# Patient Record
Sex: Female | Born: 1960
Health system: Southern US, Community
[De-identification: ages and names within clinical notes are randomized; demographics above are authoritative.]

## PROBLEM LIST (undated history)

## (undated) DIAGNOSIS — G43909 Migraine, unspecified, not intractable, without status migrainosus: Secondary | ICD-10-CM

## (undated) DIAGNOSIS — F32A Depression, unspecified: Secondary | ICD-10-CM

## (undated) DIAGNOSIS — C539 Malignant neoplasm of cervix uteri, unspecified: Secondary | ICD-10-CM

## (undated) DIAGNOSIS — F419 Anxiety disorder, unspecified: Secondary | ICD-10-CM

## (undated) DIAGNOSIS — F329 Major depressive disorder, single episode, unspecified: Secondary | ICD-10-CM

## (undated) DIAGNOSIS — I10 Essential (primary) hypertension: Secondary | ICD-10-CM

## (undated) DIAGNOSIS — J42 Unspecified chronic bronchitis: Secondary | ICD-10-CM

## (undated) DIAGNOSIS — R002 Palpitations: Secondary | ICD-10-CM

## (undated) DIAGNOSIS — C569 Malignant neoplasm of unspecified ovary: Secondary | ICD-10-CM

## (undated) DIAGNOSIS — G4700 Insomnia, unspecified: Secondary | ICD-10-CM

## (undated) DIAGNOSIS — F909 Attention-deficit hyperactivity disorder, unspecified type: Secondary | ICD-10-CM

## (undated) DIAGNOSIS — E785 Hyperlipidemia, unspecified: Secondary | ICD-10-CM

## (undated) DIAGNOSIS — A6920 Lyme disease, unspecified: Secondary | ICD-10-CM

## (undated) HISTORY — DX: Hyperlipidemia, unspecified: E78.5

## (undated) HISTORY — DX: Attention-deficit hyperactivity disorder, unspecified type: F90.9

## (undated) HISTORY — PX: BREAST ENHANCEMENT SURGERY: SHX7

## (undated) HISTORY — DX: Depression, unspecified: F32.A

## (undated) HISTORY — DX: Insomnia, unspecified: G47.00

## (undated) HISTORY — DX: Palpitations: R00.2

## (undated) HISTORY — DX: Anxiety disorder, unspecified: F41.9

## (undated) HISTORY — DX: Malignant neoplasm of cervix uteri, unspecified: C53.9

## (undated) HISTORY — DX: Essential (primary) hypertension: I10

## (undated) HISTORY — DX: Lyme disease, unspecified: A69.20

## (undated) HISTORY — DX: Unspecified chronic bronchitis: J42

---
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
## 1992-12-17 HISTORY — PX: ABDOMINAL HYSTERECTOMY: SHX81

## 1996-12-17 HISTORY — PX: AUGMENTATION MAMMAPLASTY: SUR837

## 1998-12-17 HISTORY — PX: ANTERIOR CERVICAL DECOMP/DISCECTOMY FUSION: SHX1161

## 2001-11-04 ENCOUNTER — Encounter: Payer: Self-pay | Admitting: Family Medicine

## 2001-11-04 ENCOUNTER — Encounter: Admission: RE | Admit: 2001-11-04 | Discharge: 2001-11-04 | Payer: Self-pay | Admitting: Family Medicine

## 2001-11-25 ENCOUNTER — Encounter: Payer: Self-pay | Admitting: Neurosurgery

## 2001-11-25 ENCOUNTER — Ambulatory Visit (HOSPITAL_COMMUNITY): Admission: RE | Admit: 2001-11-25 | Discharge: 2001-11-26 | Payer: Self-pay | Admitting: Neurosurgery

## 2001-12-29 ENCOUNTER — Encounter: Payer: Self-pay | Admitting: Neurosurgery

## 2001-12-29 ENCOUNTER — Ambulatory Visit (HOSPITAL_COMMUNITY): Admission: RE | Admit: 2001-12-29 | Discharge: 2001-12-29 | Payer: Self-pay | Admitting: Neurosurgery

## 2002-01-16 ENCOUNTER — Ambulatory Visit (HOSPITAL_COMMUNITY): Admission: RE | Admit: 2002-01-16 | Discharge: 2002-01-16 | Payer: Self-pay | Admitting: Neurosurgery

## 2002-01-16 ENCOUNTER — Encounter: Payer: Self-pay | Admitting: Neurosurgery

## 2002-03-09 ENCOUNTER — Ambulatory Visit (HOSPITAL_COMMUNITY): Admission: RE | Admit: 2002-03-09 | Discharge: 2002-03-09 | Payer: Self-pay | Admitting: Neurosurgery

## 2002-03-09 ENCOUNTER — Encounter: Payer: Self-pay | Admitting: Neurosurgery

## 2003-03-19 ENCOUNTER — Encounter: Payer: Self-pay | Admitting: Family Medicine

## 2003-03-19 ENCOUNTER — Encounter: Admission: RE | Admit: 2003-03-19 | Discharge: 2003-03-19 | Payer: Self-pay | Admitting: Family Medicine

## 2003-06-10 ENCOUNTER — Ambulatory Visit (HOSPITAL_COMMUNITY): Admission: RE | Admit: 2003-06-10 | Discharge: 2003-06-10 | Payer: Self-pay | Admitting: Family Medicine

## 2005-01-10 ENCOUNTER — Encounter: Admission: RE | Admit: 2005-01-10 | Discharge: 2005-01-10 | Payer: Self-pay | Admitting: Family Medicine

## 2014-02-22 ENCOUNTER — Other Ambulatory Visit (HOSPITAL_COMMUNITY)
Admission: RE | Admit: 2014-02-22 | Discharge: 2014-02-22 | Disposition: A | Discharge status: Home / Self Care | Payer: BC Managed Care – PPO | Source: Ambulatory Visit | Attending: Internal Medicine | Admitting: Internal Medicine

## 2014-02-22 DIAGNOSIS — Z01419 Encounter for gynecological examination (general) (routine) without abnormal findings: Secondary | ICD-10-CM | POA: Insufficient documentation

## 2014-09-15 ENCOUNTER — Encounter (HOSPITAL_BASED_OUTPATIENT_CLINIC_OR_DEPARTMENT_OTHER): Payer: Self-pay | Admitting: Emergency Medicine

## 2014-09-15 ENCOUNTER — Emergency Department (HOSPITAL_BASED_OUTPATIENT_CLINIC_OR_DEPARTMENT_OTHER): Payer: BC Managed Care – PPO

## 2014-09-15 ENCOUNTER — Emergency Department (HOSPITAL_BASED_OUTPATIENT_CLINIC_OR_DEPARTMENT_OTHER)
Admission: EM | Admit: 2014-09-15 | Discharge: 2014-09-15 | Disposition: A | Discharge status: Home / Self Care | Payer: BC Managed Care – PPO | Attending: Emergency Medicine | Admitting: Emergency Medicine

## 2014-09-15 DIAGNOSIS — Z8543 Personal history of malignant neoplasm of ovary: Secondary | ICD-10-CM | POA: Diagnosis not present

## 2014-09-15 DIAGNOSIS — G43909 Migraine, unspecified, not intractable, without status migrainosus: Secondary | ICD-10-CM | POA: Insufficient documentation

## 2014-09-15 DIAGNOSIS — M531 Cervicobrachial syndrome: Secondary | ICD-10-CM | POA: Diagnosis not present

## 2014-09-15 DIAGNOSIS — R112 Nausea with vomiting, unspecified: Secondary | ICD-10-CM | POA: Diagnosis not present

## 2014-09-15 DIAGNOSIS — Z8679 Personal history of other diseases of the circulatory system: Secondary | ICD-10-CM | POA: Insufficient documentation

## 2014-09-15 DIAGNOSIS — R509 Fever, unspecified: Secondary | ICD-10-CM | POA: Insufficient documentation

## 2014-09-15 DIAGNOSIS — Z79899 Other long term (current) drug therapy: Secondary | ICD-10-CM | POA: Insufficient documentation

## 2014-09-15 DIAGNOSIS — M5481 Occipital neuralgia: Secondary | ICD-10-CM

## 2014-09-15 DIAGNOSIS — F17200 Nicotine dependence, unspecified, uncomplicated: Secondary | ICD-10-CM | POA: Insufficient documentation

## 2014-09-15 HISTORY — DX: Migraine, unspecified, not intractable, without status migrainosus: G43.909

## 2014-09-15 HISTORY — DX: Malignant neoplasm of unspecified ovary: C56.9

## 2014-09-15 LAB — BASIC METABOLIC PANEL
Anion gap: 15 (ref 5–15)
BUN: 9 mg/dL (ref 6–23)
CO2: 29 mEq/L (ref 19–32)
Calcium: 9.6 mg/dL (ref 8.4–10.5)
Chloride: 97 mEq/L (ref 96–112)
Creatinine, Ser: 0.7 mg/dL (ref 0.50–1.10)
GFR calc Af Amer: >90 mL/min (ref >90)
GFR calc non Af Amer: >90 mL/min (ref >90)
Glucose, Bld: 114 mg/dL — ABNORMAL HIGH (ref 70–99)
Potassium: 3.6 mEq/L — ABNORMAL LOW (ref 3.7–5.3)
Sodium: 141 mEq/L (ref 137–147)

## 2014-09-15 LAB — URINALYSIS, ROUTINE W REFLEX MICROSCOPIC
Bilirubin Urine: NEGATIVE
Glucose, UA: NEGATIVE mg/dL
Ketones, ur: NEGATIVE mg/dL
Leukocytes, UA: NEGATIVE
Nitrite: NEGATIVE
Protein, ur: 30 mg/dL — AB
Specific Gravity, Urine: 1.021 (ref 1.005–1.030)
Urobilinogen, UA: 4 mg/dL — ABNORMAL HIGH (ref 0.0–1.0)
pH: 7 (ref 5.0–8.0)

## 2014-09-15 LAB — CBC WITH DIFFERENTIAL/PLATELET
Basophils Absolute: 0 10*3/uL (ref 0.0–0.1)
Basophils Relative: 0 % (ref 0–1)
Eosinophils Absolute: 0 10*3/uL (ref 0.0–0.7)
Eosinophils Relative: 0 % (ref 0–5)
HCT: 39.4 % (ref 36.0–46.0)
Hemoglobin: 13.3 g/dL (ref 12.0–15.0)
Lymphocytes Relative: 12 % (ref 12–46)
Lymphs Abs: 0.9 10*3/uL (ref 0.7–4.0)
MCH: 32.8 pg (ref 26.0–34.0)
MCHC: 33.8 g/dL (ref 30.0–36.0)
MCV: 97.3 fL (ref 78.0–100.0)
Monocytes Absolute: 0.8 10*3/uL (ref 0.1–1.0)
Monocytes Relative: 10 % (ref 3–12)
Neutro Abs: 6.2 10*3/uL (ref 1.7–7.7)
Neutrophils Relative %: 78 % — ABNORMAL HIGH (ref 43–77)
Platelets: 166 10*3/uL (ref 150–400)
RBC: 4.05 MIL/uL (ref 3.87–5.11)
RDW: 12.6 % (ref 11.5–15.5)
WBC: 7.9 10*3/uL (ref 4.0–10.5)

## 2014-09-15 LAB — URINE MICROSCOPIC-ADD ON

## 2014-09-15 LAB — CSF CELL COUNT WITH DIFFERENTIAL
RBC Count, CSF: 0 /mm3
RBC Count, CSF: 0 /mm3
Tube #: 1
Tube #: 4
WBC, CSF: 0 /mm3 (ref 0–5)
WBC, CSF: 1 /mm3 (ref 0–5)

## 2014-09-15 LAB — GLUCOSE, CSF: Glucose, CSF: 79 mg/dL — ABNORMAL HIGH (ref 43–76)

## 2014-09-15 LAB — I-STAT CG4 LACTIC ACID, ED: Lactic Acid, Venous: 0.68 mmol/L (ref 0.5–2.2)

## 2014-09-15 LAB — PROTEIN, CSF: Total  Protein, CSF: 30 mg/dL (ref 15–45)

## 2014-09-15 MED ORDER — KETOROLAC TROMETHAMINE 30 MG/ML IJ SOLN
30.0000 mg | Freq: Once | INTRAMUSCULAR | Status: AC
  Administered 2014-09-15: 30 mg via INTRAVENOUS
  Filled 2014-09-15: qty 1

## 2014-09-15 MED ORDER — SODIUM CHLORIDE 0.9 % IV BOLUS (SEPSIS)
1000.0000 mL | Freq: Once | INTRAVENOUS | Status: AC
  Administered 2014-09-15: 1000 mL via INTRAVENOUS

## 2014-09-15 MED ORDER — MORPHINE SULFATE 4 MG/ML IJ SOLN
4.0000 mg | Freq: Once | INTRAMUSCULAR | Status: AC
  Administered 2014-09-15: 4 mg via INTRAVENOUS
  Filled 2014-09-15: qty 1

## 2014-09-15 MED ORDER — ACETAMINOPHEN 325 MG PO TABS
650.0000 mg | ORAL_TABLET | Freq: Once | ORAL | Status: AC
  Administered 2014-09-15: 650 mg via ORAL
  Filled 2014-09-15: qty 2

## 2014-09-15 MED ORDER — CARBAMAZEPINE ER 100 MG PO TB12: 100.0000 mg | ORAL_TABLET | Freq: Two times a day (BID) | ORAL | Status: DC

## 2014-09-15 MED ORDER — OXYCODONE-ACETAMINOPHEN 5-325 MG PO TABS: 1.0000 | ORAL_TABLET | ORAL | Status: DC | PRN

## 2014-09-15 MED ORDER — SODIUM CHLORIDE 0.9 % IV BOLUS (SEPSIS)
1000.0000 mL | Freq: Once | INTRAVENOUS | Status: AC
Start: 2014-09-15 — End: 2014-09-15
  Administered 2014-09-15: 1000 mL via INTRAVENOUS

## 2014-09-15 NOTE — ED Notes (Signed)
Pt positioned lying flat on left side for 45 min per order post LP. Pt tolerated procedure well.

## 2014-09-15 NOTE — ED Provider Notes (Signed)
Patient signed out to me to follow up on CSF studies. Patient was seen for headache and fever. Patient's blood work and CT scan were negative. A lumbar puncture was performed to further evaluate for headache and fever. Results are very reassuring. No concern for meningitis based on the results. Also, no concern for subarachnoid hemorrhage.  Fevers of unclear origin. His any specific symptoms to explain the fever. White count is normal. All lab work was normal. Urinalysis equivocal, urine culture was sent.  I reexamined the patient and she is experiencing sharp stabbing pains from the base of her skull across the left side of her scalp. These come on in waves. It lasts for seconds and then resolves. She describes it as an ice pick type of feeling. This is consistent with neuralgia, occipital in origin. Patient will be treated with Percocet, Tegretol, follow up with her primary care doctor and neurology. Return if symptoms worsen.  Orpah Greek, MD 09/15/14 [phone]

## 2014-09-15 NOTE — Discharge Instructions (Signed)
Occipital Neuralgia Occipital neuralgia is a type of headache that causes episodes of very bad pain in the back of your head. Pain from occipital neuralgia may spread (radiate) to other parts of your head. The pain is usually brief and often goes away after you rest and relax. These headaches may be caused by irritation of the nerves that leave your spinal cord high up in your neck, just below the base of your skull (occipital nerves). Your occipital nerves transmit sensations from the back of your head, the top of your head, and the areas behind your ears. CAUSES Occipital neuralgia can occur without any known cause (primary headache syndrome). In other cases, occipital neuralgia is caused by pressure on or irritation of one of the two occipital nerves. Causes of occipital nerve compression or irritation include:  Wear and tear of the vertebrae in the neck (osteoarthritis).  Neck injury.  Disease of the disks that separate the vertebrae.  Tumors.  Gout.  Infections.  Diabetes.  Swollen blood vessels that put pressure on the occipital nerves.  Muscle spasm in the neck. SIGNS AND SYMPTOMS Pain is the main symptom of occipital neuralgia. It usually starts in the back of the head but may also be felt in other areas supplied by the occipital nerves. Pain is usually on one side but may be on both sides. You may have:   Brief episodes of very bad pain that is burning, stabbing, shocking, or shooting.  Pain behind the eye.  Pain triggered by neck movement or hair brushing.  Scalp tenderness.  Aching in the back of the head between episodes of very bad pain. DIAGNOSIS  Your health care provider may diagnose occipital neuralgia based on your symptoms and a physical exam. During the exam, the health care provider may push on areas supplied by the occipital nerves to see if they are painful. Some tests may also be done to help in making the diagnosis. These may include:  Imaging studies of  the upper spinal cord, such as an MRI or CT scan. These may show compression or spinal cord abnormalities.  Nerve block. You will get an injection of numbing medicine (local anesthetic) near the occipital nerve to see if this relieves pain. TREATMENT  Treatment may begin with simple measures, such as:   Rest.  Massage.  Heat.  Over-the-counter pain relievers. If these measures do not work, you may need other treatments, including:  Medicines such as:  Prescription-strength anti-inflammatory medicines.  Muscle relaxants.  Antiseizure medicines.  Antidepressants.  Steroid injection. This involves injections of local anesthetic and strong anti-inflammatory drugs (steroids).  Pulsed radiofrequency. Wires are implanted to deliver electrical impulses that block pain signals from the occipital nerve.  Physical therapy.  Surgery to relieve nerve pressure. HOME CARE INSTRUCTIONS  Take all medicines as directed by your health care provider.  Avoid activities that cause pain.  Rest when you have an attack of pain.  Try gentle massage or a heating pad to relieve pain.  Work with a physical therapist to learn stretching exercises you can do at home.  Try a different pillow or sleeping position.  Practice good posture.  Try to stay active. Get regular exercise that does not cause pain. Ask your health care provider to suggest safe exercises for you.  Keep all follow-up visits as directed by your health care provider. This is important. SEEK MEDICAL CARE IF:  Your medicine is not working.  You have new or worsening symptoms. SEEK IMMEDIATE MEDICAL CARE   IF:  You have very bad head pain that is not going away.  You have a sudden change in vision, balance, or speech. MAKE SURE YOU:  Understand these instructions.  Will watch your condition.  Will get help right away if you are not doing well or get worse. Document Released: 11/27/2001 Document Revised: 04/19/2014  Document Reviewed: 11/25/2013 [employer] Patient Information 2015 [employer]. This information is not intended to replace advice given to you by your health care provider. Make sure you discuss any questions you have with your health care provider.  

## 2014-09-15 NOTE — ED Provider Notes (Signed)
CSN: [id number]     Arrival date & time 09/15/14  1243 History   First MD Initiated Contact with Patient 09/15/14 1258     Chief Complaint  Patient presents with  . Fever      Patient is a 53 y.o. female presenting with headaches. The history is provided by the patient.  Headache Pain location:  Occipital Quality:  Stabbing Onset quality:  Sudden Duration:  2 days Timing:  Intermittent Progression:  Worsening Chronicity:  New Relieved by:  Nothing Worsened by:  Nothing tried Associated symptoms: fever, nausea and vomiting   Associated symptoms: no abdominal pain, no cough and no ear pain    Pt reports she has had fever for 3 days Starting 2 days ago she starting having occipital headaches that are intermittent and "shooting" into the front of her scalp No trauma No focal weakness She has never had this before No cough/sore throat   She reports distant h/o neck surgery, but no other neurosurgical procedure  Past Medical History  Diagnosis Date  . Migraine   . Ovarian cancer    Past Surgical History  Procedure Laterality Date  . Abdominal hysterectomy     No family history on file. History  Substance Use Topics  . Smoking status: Current Every Day Smoker -- 0.20 packs/day    Types: Cigarettes  . Smokeless tobacco: Not on file  . Alcohol Use: Not on file   OB History   Grav Para Term Preterm Abortions TAB SAB Ect Mult Living                 Review of Systems  Constitutional: Positive for fever.  HENT: Negative for ear pain.   Respiratory: Negative for cough.   Gastrointestinal: Positive for nausea and vomiting. Negative for abdominal pain.  Genitourinary: Negative for dysuria.  Neurological: Positive for headaches. Negative for weakness.  All other systems reviewed and are negative.     Allergies  Erythromycin  Home Medications   Prior to Admission medications   Medication Sig Start Date End Date Taking? Authorizing Provider  atenolol (TENORMIN)  50 MG tablet Take 50 mg by mouth daily.   Yes Historical Provider, MD  FLUoxetine (PROZAC) 20 MG capsule Take 20 mg by mouth daily.   Yes Historical Provider, MD  zolpidem (AMBIEN) 10 MG tablet Take 10 mg by mouth at bedtime as needed for sleep.   Yes Historical Provider, MD   BP 100/64  Pulse 93  Temp(Src) 101.8 F (38.8 C) (Oral)  Resp 20  Ht 5\' 5"  (1.651 m)  Wt 161 lb (73.029 kg)  BMI 26.79 kg/m2  SpO2 100% Physical Exam CONSTITUTIONAL: Well developed/well nourished.  Pt with frequent pain episodes and will move frequently in bed HEAD: Normocephalic/atraumatic EYES: EOMI/PERRL ENMT: Mucous membranes moist NECK: supple no meningeal signs SPINE:entire spine nontender, no erythema noted CV: S1/S2 noted, no murmurs/rubs/gallops noted LUNGS: Lungs are clear to auscultation bilaterally, no apparent distress ABDOMEN: soft, nontender, no rebound or guarding GU:no cva tenderness NEURO: Pt is awake/alert, moves all extremitiesx4 EXTREMITIES: pulses normal, full ROM SKIN: warm, color normal PSYCH: no abnormalities of mood noted  ED Course  LUMBAR PUNCTURE Date/Time: 09/15/2014 2:37 PM Performed by: Sharyon Cable Authorized by: Sharyon Cable Consent: written consent obtained. Risks and benefits: risks, benefits and alternatives were discussed Consent given by: patient Patient identity confirmed: verbally with patient and provided demographic data Time out: Immediately prior to procedure a "time out" was called to verify the correct patient,  procedure, equipment, support staff and site/side marked as required. Indications: evaluation for infection Anesthesia: local infiltration Local anesthetic: lidocaine 1% without epinephrine Anesthetic total: 5 ml Patient sedated: no Preparation: Patient was prepped and draped in the usual sterile fashion. Lumbar space: L4-L5 interspace Patient's position: sitting Needle gauge: 20 Number of attempts: 1 Fluid appearance: clear Tubes  of fluid: 4 Total volume: 5 ml Post-procedure: adhesive bandage applied Patient tolerance: Patient tolerated the procedure well with no immediate complications.      1:22 PM Pt with fever/HA.  She has frequent episodes of pain that start in occiput and radiates to crown/frontal part of head No signs of trauma No cervical spine tenderness With evidence of fever/HA, possible meningitis and advised she will need lumbar puncture CT head pending 2:39 PM Ct head negative She tolerated LP well but did have drop in BP afterwards, IV fluids ordered Will continue to monitor 3:20 PM Pt with some post procedural hypotension, suspect possible vagal reaction as she has no new complaints (no abd pain, no weakness in her legs, no vomiting, no cough) Labs pending Lactate was ordered and IV fluids ordered Will await to add on antibiotics once gram stain and cell count are resulted D/w dr Betsey Holiday to f/u on labs   Labs Review Labs Reviewed  BASIC METABOLIC PANEL - Abnormal; Notable for the following:    Potassium 3.6 (*)    Glucose, Bld 114 (*)    All other components within normal limits  CBC WITH DIFFERENTIAL - Abnormal; Notable for the following:    Neutrophils Relative % 78 (*)    All other components within normal limits  URINALYSIS, ROUTINE W REFLEX MICROSCOPIC - Abnormal; Notable for the following:    Hgb urine dipstick LARGE (*)    Protein, ur 30 (*)    Urobilinogen, UA 4.0 (*)    All other components within normal limits  URINE MICROSCOPIC-ADD ON - Abnormal; Notable for the following:    Squamous Epithelial / LPF FEW (*)    Bacteria, UA FEW (*)    All other components within normal limits  GLUCOSE, CSF - Abnormal; Notable for the following:    Glucose, CSF 79 (*)    All other components within normal limits  CSF CULTURE  GRAM STAIN  CSF CULTURE  GRAM STAIN  CSF CULTURE  PROTEIN, CSF  CSF CELL COUNT WITH DIFFERENTIAL  CSF CELL COUNT WITH DIFFERENTIAL  GLUCOSE, CSF   PROTEIN, CSF  CSF CELL COUNT WITH DIFFERENTIAL  CSF CELL COUNT WITH DIFFERENTIAL  GLUCOSE, CSF  PROTEIN, CSF  CSF CELL COUNT WITH DIFFERENTIAL  CSF CELL COUNT WITH DIFFERENTIAL  I-STAT CG4 LACTIC ACID, ED    Imaging Review Ct Head Wo Contrast  09/15/2014   CLINICAL DATA:  Headache.  Fever.  History of ovarian cancer.  EXAM: CT HEAD WITHOUT CONTRAST  TECHNIQUE: Contiguous axial images were obtained from the base of the skull through the vertex without intravenous contrast.  COMPARISON:  None.  FINDINGS: Sinuses/Soft tissues: Clear paranasal sinuses and mastoid air cells.  Intracranial: No mass lesion, hemorrhage, hydrocephalus, acute infarct, intra-axial, or extra-axial fluid collection.  IMPRESSION: Normal head CT.   Electronically Signed   By: Abigail Miyamoto M.D.   On: 09/15/2014 13:28      MDM   Final diagnoses:  None    Nursing notes including past medical history and social history reviewed and considered in documentation Labs/vital reviewed and considered     Sharyon Cable, MD 09/15/14 1523

## 2014-09-15 NOTE — ED Notes (Signed)
C/o fever since Sunday, h/a started Monday. Fever as high as 102.7. States pain shoots thru head for about 3 sec. C/o n/v last pm.

## 2014-09-17 LAB — URINE CULTURE
Colony Count: NO GROWTH
Culture: NO GROWTH

## 2014-09-18 LAB — CSF CULTURE W GRAM STAIN: Culture: NO GROWTH

## 2014-09-18 LAB — CSF CULTURE: Gram Stain: NONE SEEN

## 2014-10-25 ENCOUNTER — Ambulatory Visit: Payer: BC Managed Care – PPO | Admitting: Neurology

## 2014-12-17 HISTORY — PX: EYE SURGERY: SHX253

## 2015-02-23 ENCOUNTER — Encounter: Payer: Self-pay | Admitting: Internal Medicine

## 2015-02-23 ENCOUNTER — Ambulatory Visit (INDEPENDENT_AMBULATORY_CARE_PROVIDER_SITE_OTHER): Payer: BLUE CROSS/BLUE SHIELD | Admitting: Internal Medicine

## 2015-02-23 VITALS — BP 123/82 | HR 73 | Temp 98.2°F | Ht 65.0 in | Wt 158.0 lb

## 2015-02-23 DIAGNOSIS — R5382 Chronic fatigue, unspecified: Secondary | ICD-10-CM | POA: Diagnosis not present

## 2015-02-23 DIAGNOSIS — R5381 Other malaise: Secondary | ICD-10-CM

## 2015-02-23 NOTE — Progress Notes (Signed)
   Subjective:    Patient ID: Sharon Huffman, female    DOB: Nov 19, 1961, 54 y.o.   MRN: [id number]  HPI She comes in here for evaluation of possible Lyme disease. She gives me a history of starting to feel sick on September 27 of 2015. She describes some fever and she did have a rash on the back of her leg. No tick bite was noted at the time. The rash looked like a target lesion however spread to her feet her neck and her chest area. Was erythematous. No symptoms of Bell's palsy, no meningitis or encephalitis symptoms, no joint swelling or effusion in her knees. She then continued to have fever up to 103 for several weeks. She had a sharp ache as well and went to the [facility] emergency room.  There she had a CSF that was negative for white cells or any meningitis or encephalitis.  13 and glucose were normal. She was then given Tegretol 4 simple neuralgia. After taking the Tegretol, this is when she got a rash as described that started on the back of her right leg. She saw a dermatologist who felt it was secondary to the Tegretol. She then describes symptoms of her arms not working well, fatigue and persistent headache. She said her fever lasted several weeks. After an IT consultant, she was concerned with Lyme disease. She then asked for Lyme disease test after taking doxycycline that she had gotten from her dermatologist. Lyme disease test was positive serology for IgG and IgM. She has no travel to a Lyme endemic area. She does have deer ticks around her home but lives in [place].  She has had persistent symptoms since.     During her initial workup in the emergency room, she was told it was essentially a fever of unknown origin which she expressed the fact that all causes can be identified and there has to be a reason.   Review of Systems  Constitutional: Positive for fatigue. Negative for fever and unexpected weight change.  HENT: Negative for sore throat.   Gastrointestinal: Negative for nausea  and diarrhea.  Musculoskeletal: Positive for myalgias and arthralgias. Negative for joint swelling.  Neurological: Positive for headaches. Negative for dizziness, light-headedness and numbness.       Objective:   Physical Exam  Constitutional: She appears well-developed and well-nourished. No distress.  Eyes: No scleral icterus.  Skin: No rash noted.  Psychiatric: She has a normal mood and affect.          Assessment & Plan:

## 2015-02-23 NOTE — Assessment & Plan Note (Addendum)
She did not give any history that is concerning for Lyme disease. That would be a typical target lesion that does not spread, that would be followed by symptoms such as meningitis or encephalitis, tells palsy or possibly joint effusion. She had none of those symptoms so I do not suspect that she had Lyme disease.  Also what argues against Lyme disease as she has not been out of the area and there is very little if any Lyme disease in [place] area. She is not been to [facility]. She also did not remember a tick bite. Her symptoms are not typical of Lyme disease.  She did have positive serology however in context of no clinical syndrome of Lyme disease this would be considered a false positive. The CDC recommendation and non-endemic areas is not to use serology for diagnosis of Lyme disease as the pretest probability makes the positive predictive value low. This was explained to the patient. I suspect this was a viral syndrome that is been causing post infectious symptoms this was explained to patient and all questions were answered. Thank you for the consultation.  45 minutes spent with the patient including 25 minutes spent on face to face counseling

## 2015-06-14 ENCOUNTER — Encounter: Payer: Self-pay | Admitting: Sports Medicine

## 2015-06-14 ENCOUNTER — Ambulatory Visit (INDEPENDENT_AMBULATORY_CARE_PROVIDER_SITE_OTHER): Payer: BLUE CROSS/BLUE SHIELD | Admitting: Sports Medicine

## 2015-06-14 VITALS — BP 152/92 | Ht 65.5 in | Wt 157.0 lb

## 2015-06-14 DIAGNOSIS — M5441 Lumbago with sciatica, right side: Secondary | ICD-10-CM

## 2015-06-14 DIAGNOSIS — M549 Dorsalgia, unspecified: Secondary | ICD-10-CM | POA: Insufficient documentation

## 2015-06-14 DIAGNOSIS — M542 Cervicalgia: Secondary | ICD-10-CM | POA: Diagnosis not present

## 2015-06-14 MED ORDER — METHYLPREDNISOLONE ACETATE 80 MG/ML IJ SUSP
80.0000 mg | Freq: Once | INTRAMUSCULAR | Status: AC
  Administered 2015-06-14: 80 mg via INTRAMUSCULAR

## 2015-06-14 MED ORDER — KETOROLAC TROMETHAMINE 60 MG/2ML IM SOLN
60.0000 mg | Freq: Once | INTRAMUSCULAR | Status: AC
  Administered 2015-06-14: 60 mg via INTRAMUSCULAR

## 2015-06-14 MED ORDER — TRAMADOL HCL 50 MG PO TABS: 50.0000 mg | ORAL_TABLET | Freq: Two times a day (BID) | ORAL | Status: DC

## 2015-06-14 MED ORDER — CYCLOBENZAPRINE HCL 10 MG PO TABS: 10.0000 mg | ORAL_TABLET | Freq: Every evening | ORAL | Status: DC | PRN

## 2015-06-14 NOTE — Assessment & Plan Note (Signed)
Acute low back pain without red flags 80 depo medrol and 60 of toradol given today Normal reflexes, strength, and function so will treat acute episode and follow up after neck imaging for relief

## 2015-06-14 NOTE — Assessment & Plan Note (Signed)
6 months of chronic R sided neck pain with arm paraesthesias in C6 distribution, notable she has a Hx of HNP s/p surgical treatment with resolution of those symptoms Confounder of Lyme exposure but it was treated and several months prior to onset leading [place] to believe it is not related With symptoms for 6 months and  Hx of surgery will pursue advanced imaging, pending discussion with radiology to make teh best decision with the hardware in her neck.

## 2015-06-14 NOTE — Progress Notes (Signed)
HPI  Patient presents today for back pain and chronic neck pain  Low back pain Has had intermittent mild low back pain with some sciatica on the right side for several months. Yesterday she was doing yoga stretches involving low back stretching and had an acute onset right-sided low back pain with intermittent right posterior leg paresthesia type pain. She has no problem walking, she denies bowel or bladder dysfunction, saddle anesthesia, and leg weakness. She's been taking Mobic for this pain which helps minimally.  Neck pain She has a six-month history of neck and shoulder pain with paresthesia type symptoms radiating down her arm on the extensor surface of her R forearm and first through third digits. She does not have any arm weakness or dysfunction. The pain is intermittent and helped by 2400 mg daily of ibuprofen and only slightly by Mobic. She's recently switched from ibuprofen to meloxicam because of the high dose that she was taking on a daily basis. She feels her strength is normal.  Notably she had a Lyme disease exposure last August. This was treated with doxycycline in October. Her symptoms of back and neck pain were concerning to be associated with this, however they started only about 6 months ago and her tick exposure was about 9 months ago.   PMH: Smoking status noted ROS: Per HPI  Objective: BP 152/92 mmHg  Ht 5' 5.5" (1.664 m)  Wt 157 lb (71.215 kg)  BMI 25.72 kg/m2 Gen: NAD, alert, cooperative with exam HEENT: NCAT Ext: No edema, warm Neuro: Alert and oriented  Reflexes: BL Arms with 1+ triceps, 2+ biceps and brachioradialis, BL LE with 2+ achilles and patellar  Sensation intact and symmetric in BL UE Strength 5/5 throughout MSK:  Full ROM at neck and low back No muscle atrophy of UE No tendernss to palpation of shoulder landmarks or lumbar spine/paraspinal muscles Negative modified straight leg  Assessment and plan:  Back pain Acute low back pain  without red flags 80 depo medrol and 60 of toradol given today Normal reflexes, strength, and function so will treat acute episode and follow up after neck imaging for relief   Neck pain 6 months of chronic R sided neck pain with arm paraesthesias in C6 distribution, notable she has a Hx of HNP s/p surgical treatment with resolution of those symptoms Confounder of Lyme exposure but it was treated and several months prior to onset leading [place] to believe it is not related With symptoms for 6 months and  Hx of surgery will pursue advanced imaging, pending discussion with radiology to make teh best decision with the hardware in her neck.      Meds ordered this encounter  Medications  . traMADol (ULTRAM) 50 MG tablet    Sig: Take 1 tablet (50 mg total) by mouth 2 (two) times daily.    Dispense:  60 tablet    Refill:  0  . cyclobenzaprine (FLEXERIL) 10 MG tablet    Sig: Take 1 tablet (10 mg total) by mouth at bedtime as needed for muscle spasms.    Dispense:  30 tablet    Refill:  0    Patient seen and evaluated with the above resident. Prior to ordering advanced imaging I would like to get a plain x-ray of her cervical spine. I will like to specifically evaluate the hardware from her single level fusion. I will call her after I reviewed that study at which point we may decide to go ahead with further diagnostic imaging.

## 2015-06-15 ENCOUNTER — Encounter: Payer: Self-pay | Admitting: Sports Medicine

## 2015-06-15 ENCOUNTER — Telehealth: Payer: Self-pay | Admitting: Sports Medicine

## 2015-06-15 ENCOUNTER — Ambulatory Visit
Admission: RE | Admit: 2015-06-15 | Discharge: 2015-06-15 | Disposition: A | Discharge status: Home / Self Care | Payer: BLUE CROSS/BLUE SHIELD | Source: Ambulatory Visit | Attending: Sports Medicine | Admitting: Sports Medicine

## 2015-06-15 DIAGNOSIS — M542 Cervicalgia: Secondary | ICD-10-CM

## 2015-06-15 NOTE — Telephone Encounter (Signed)
I spoke with the patient on the phone today after reviewing the x-rays of her cervical spine. It looks like the lower right screw has worked itself out of the fusion plate and lies in the anterior prevertebral soft tissue at C7-T1. Her cervical fusion was done in 2000 by Dr. Trenton Gammon. I recommended a referral back over to him for his input regarding this unusual finding. She is also continuing to complain of low back pain despite yesterday's injections of Depo-Medrol and Toradol. Ultram and Flexeril have only been minimally beneficial. I will try her on Voltaren 75 mg twice daily with food in addition to her other medications. If her low back pain persists then this is something that Dr. Trenton Gammon can also address.

## 2015-06-16 MED ORDER — DICLOFENAC SODIUM 75 MG PO TBEC: 75.0000 mg | DELAYED_RELEASE_TABLET | Freq: Two times a day (BID) | ORAL | Status: DC

## 2015-06-16 NOTE — Addendum Note (Signed)
Addended by: Laurey Arrow on: 06/16/2015 05:37 PM   Modules accepted: Orders

## 2015-06-17 ENCOUNTER — Other Ambulatory Visit: Payer: Self-pay | Admitting: *Deleted

## 2015-06-17 DIAGNOSIS — M542 Cervicalgia: Secondary | ICD-10-CM

## 2015-06-30 ENCOUNTER — Other Ambulatory Visit: Payer: Self-pay | Admitting: Sports Medicine

## 2015-07-01 ENCOUNTER — Other Ambulatory Visit: Payer: Self-pay | Admitting: *Deleted

## 2015-07-01 MED ORDER — CYCLOBENZAPRINE HCL 10 MG PO TABS: 10.0000 mg | ORAL_TABLET | Freq: Every evening | ORAL | Status: DC | PRN

## 2015-07-01 NOTE — Telephone Encounter (Signed)
Pt requested refill.  Dr. Micheline Chapman said ok to refill 60 tablets.

## 2015-07-21 ENCOUNTER — Other Ambulatory Visit: Payer: Self-pay | Admitting: Sports Medicine

## 2015-07-21 ENCOUNTER — Other Ambulatory Visit: Payer: Self-pay | Admitting: *Deleted

## 2015-07-25 MED ORDER — TRAMADOL HCL 50 MG PO TABS: 50.0000 mg | ORAL_TABLET | Freq: Two times a day (BID) | ORAL | Status: DC

## 2015-07-25 NOTE — Addendum Note (Signed)
Addended by: Laurey Arrow on: 07/25/2015 01:25 PM   Modules accepted: Orders

## 2015-08-29 ENCOUNTER — Other Ambulatory Visit: Payer: Self-pay | Admitting: *Deleted

## 2015-08-29 MED ORDER — CYCLOBENZAPRINE HCL 10 MG PO TABS: 10.0000 mg | ORAL_TABLET | Freq: Every day | ORAL | Status: DC

## 2015-08-30 ENCOUNTER — Telehealth: Payer: Self-pay | Admitting: *Deleted

## 2015-08-30 NOTE — Telephone Encounter (Signed)
Pharmacy called with refill request on Tramadol.  Gave 30 tablets and the patient will need to call us if she needs another refill, since we may need to see her back in the office.

## 2016-05-10 DIAGNOSIS — M791 Myalgia: Secondary | ICD-10-CM | POA: Diagnosis not present

## 2016-05-10 DIAGNOSIS — W57XXXA Bitten or stung by nonvenomous insect and other nonvenomous arthropods, initial encounter: Secondary | ICD-10-CM | POA: Diagnosis not present

## 2016-05-31 DIAGNOSIS — M2550 Pain in unspecified joint: Secondary | ICD-10-CM | POA: Diagnosis not present

## 2016-05-31 DIAGNOSIS — E7800 Pure hypercholesterolemia, unspecified: Secondary | ICD-10-CM | POA: Diagnosis not present

## 2016-07-09 DIAGNOSIS — I10 Essential (primary) hypertension: Secondary | ICD-10-CM | POA: Diagnosis not present

## 2016-07-09 DIAGNOSIS — M79672 Pain in left foot: Secondary | ICD-10-CM | POA: Diagnosis not present

## 2016-07-09 DIAGNOSIS — M19042 Primary osteoarthritis, left hand: Secondary | ICD-10-CM | POA: Diagnosis not present

## 2016-07-09 DIAGNOSIS — M79671 Pain in right foot: Secondary | ICD-10-CM | POA: Diagnosis not present

## 2016-07-09 DIAGNOSIS — E7800 Pure hypercholesterolemia, unspecified: Secondary | ICD-10-CM | POA: Diagnosis not present

## 2016-07-09 DIAGNOSIS — N390 Urinary tract infection, site not specified: Secondary | ICD-10-CM | POA: Diagnosis not present

## 2016-07-09 DIAGNOSIS — R5383 Other fatigue: Secondary | ICD-10-CM | POA: Diagnosis not present

## 2016-07-09 DIAGNOSIS — M19041 Primary osteoarthritis, right hand: Secondary | ICD-10-CM | POA: Diagnosis not present

## 2016-07-09 DIAGNOSIS — E559 Vitamin D deficiency, unspecified: Secondary | ICD-10-CM | POA: Diagnosis not present

## 2016-07-09 DIAGNOSIS — M791 Myalgia: Secondary | ICD-10-CM | POA: Diagnosis not present

## 2016-07-09 DIAGNOSIS — M064 Inflammatory polyarthropathy: Secondary | ICD-10-CM | POA: Diagnosis not present

## 2016-07-09 DIAGNOSIS — M2550 Pain in unspecified joint: Secondary | ICD-10-CM | POA: Diagnosis not present

## 2016-07-13 DIAGNOSIS — Z0000 Encounter for general adult medical examination without abnormal findings: Secondary | ICD-10-CM | POA: Diagnosis not present

## 2016-08-16 ENCOUNTER — Telehealth: Payer: Self-pay | Admitting: Acute Care

## 2016-08-16 NOTE — Telephone Encounter (Signed)
ATC pt. Unable to reach pt or LVM. Referral has been canceled due to several unsuccessful attempts to schedule SDMV. Form has been faxed to Dr. Pennie Banter office via fax. Per medical records form can not be scanned into chart.  Nothing further needed.

## 2016-08-29 DIAGNOSIS — H5213 Myopia, bilateral: Secondary | ICD-10-CM | POA: Diagnosis not present

## 2016-09-05 DIAGNOSIS — H5462 Unqualified visual loss, left eye, normal vision right eye: Secondary | ICD-10-CM | POA: Diagnosis not present

## 2016-09-25 ENCOUNTER — Ambulatory Visit (INDEPENDENT_AMBULATORY_CARE_PROVIDER_SITE_OTHER): Payer: BLUE CROSS/BLUE SHIELD | Admitting: Neurology

## 2016-09-25 ENCOUNTER — Encounter: Payer: Self-pay | Admitting: Neurology

## 2016-09-25 VITALS — BP 121/76 | HR 92 | Ht 65.5 in | Wt 180.2 lb

## 2016-09-25 DIAGNOSIS — H539 Unspecified visual disturbance: Secondary | ICD-10-CM | POA: Diagnosis not present

## 2016-09-25 DIAGNOSIS — G44221 Chronic tension-type headache, intractable: Secondary | ICD-10-CM

## 2016-09-25 NOTE — Progress Notes (Signed)
Reason for visit: Visual disturbance  Referring physician: Dr. Conni Huffman is a 55 y.o. female  History of present illness:  [name] is a 55 year old right-handed white female with a history of a posterior vitreous detachment requiring surgery in February 2016. The patient has noted a gradual decline in her visual acuity involving the left eye since the surgery, she was reevaluated in November 2016, and she had a new glasses prescription at that time that seemed to help her vision. The patient continues to have a gradual change in vision involving the left eye, and by the spring of 2017 there was a significant dichotomy between the visual acuity from one eye to the next. The patient has begun having daily headaches associated with eye strain. When she first wakes up in the morning, she has no headache, but when she tries to focus during the day, the headache gradually worsens around the left eye with a pressure sensation. The patient may feel queasy at times. She denies any focal numbness or weakness of the face, arms, or legs. She denies any problems controlling the bowels or the bladder. She reports a mild gait disturbance. She does have some neck discomfort, she has had prior cervical spine surgery. She has a history of Lyme disease, she indicates that she will run low-grade fevers off and on. She has been evaluated through rheumatology in the past. The patient was seen again for an ophthalmologic evaluation, she was given contact lenses which seemed to correct the vision in the left eye, but the patient reports that she has dry eyes, she could not tolerate the contact lens. She is sent to this office for an evaluation.  Past Medical History:  Diagnosis Date  . Lyme disease   . Migraine   . Ovarian cancer [facility])     Past Surgical History:  Procedure Laterality Date  . ABDOMINAL HYSTERECTOMY  1994  . ANTERIOR CERVICAL DECOMP/DISCECTOMY FUSION  2000  . EYE SURGERY  2016   PVD      Family History  Problem Relation Age of Onset  . Dementia Mother   . Hypertension Mother   . Hyperlipidemia Mother   . Heart attack Father     49    Social history:  reports that she has been smoking Cigarettes.  She started smoking about 40 years ago. She has been smoking about 0.50 packs per day. She has never used smokeless tobacco. She reports that she drinks alcohol. She reports that she does not use drugs.  Medications:  Prior to Admission medications   Medication Sig Start Date End Date Taking? Authorizing Provider  amphetamine-dextroamphetamine (ADDERALL) 20 MG tablet Take 20 mg by mouth daily.    Historical Provider, MD  atenolol (TENORMIN) 50 MG tablet Take 50 mg by mouth daily.    Historical Provider, MD  CRESTOR 10 MG tablet Take 5 mg by mouth daily. 06/23/15   Historical Provider, MD  cyclobenzaprine (FLEXERIL) 10 MG tablet Take 1 tablet (10 mg total) by mouth at bedtime. As needed for pain 08/29/15   Thurman Coyer, DO  diclofenac (VOLTAREN) 75 MG EC tablet Take 1 tablet (75 mg total) by mouth 2 (two) times daily. Take with food. 06/16/15   Thurman Coyer, DO  doxycycline (VIBRA-TABS) 100 MG tablet Take 100 mg by mouth 2 (two) times daily with a meal. 05/22/15   Historical Provider, MD  FLUoxetine (PROZAC) 20 MG capsule Take 20 mg by mouth daily.  Historical Provider, MD  meloxicam (MOBIC) 7.5 MG tablet TAKE 1 TABLET 1 OR 2 TIMES DAILY--DO NOT USE WITH IBUPROFEN 05/31/15   Historical Provider, MD  traMADol (ULTRAM) 50 MG tablet Take 1 tablet (50 mg total) by mouth 2 (two) times daily. 07/25/15   Carlos Levering Draper, DO  zolpidem (AMBIEN) 10 MG tablet Take 10 mg by mouth at bedtime as needed for sleep.    Historical Provider, MD      Allergies  Allergen Reactions  . Erythromycin Nausea And Vomiting    ROS:  Out of a complete 14 system review of symptoms, the patient complains only of the following symptoms, and all other reviewed systems are negative.  Weight  gain Ringing in the ears Blurred vision, eye pressure Easy bruising Feeling hot Joint pain, achy muscles  Blood pressure 121/76, pulse 92, height 5' 5.5" (1.664 m), weight 180 lb 4 oz (81.8 kg).  Physical Exam  General: The patient is alert and cooperative at the time of the examination.  Eyes: Pupils are equal, round, and reactive to light. Discs are flat bilaterally.  Neck: The neck is supple, no carotid bruits are noted.  Respiratory: The respiratory examination is clear.  Cardiovascular: The cardiovascular examination reveals a regular rate and rhythm, no obvious murmurs or rubs are noted.  Skin: Extremities are without significant edema.  Neurologic Exam  Mental status: The patient is alert and oriented x 3 at the time of the examination. The patient has apparent normal recent and remote memory, with an apparently normal attention span and concentration ability.  Cranial nerves: Facial symmetry is present. There is good sensation of the face to pinprick and soft touch bilaterally. The strength of the facial muscles and the muscles to head turning and shoulder shrug are normal bilaterally. Speech is well enunciated, no aphasia or dysarthria is noted. Extraocular movements are full. Visual fields are full. The tongue is midline, and the patient has symmetric elevation of the soft palate. No obvious hearing deficits are noted.  Motor: The motor testing reveals 5 over 5 strength of all 4 extremities. Good symmetric motor tone is noted throughout.  Sensory: Sensory testing is intact to pinprick, soft touch, vibration sensation, and position sense on all 4 extremities. No evidence of extinction is noted.  Coordination: Cerebellar testing reveals good finger-nose-finger and heel-to-shin bilaterally.  Gait and station: Gait is normal. Tandem gait is slightly unsteady. Romberg is negative. No drift is seen.  Reflexes: Deep tendon reflexes are symmetric and normal bilaterally. Toes  are downgoing bilaterally.   Assessment/Plan:  1. Blurred vision, OS  2. Chronic daily headache  The patient reports that she has significant problems with blurred vision of the left eye, the vision she claims can be corrected with using a contact lens. If this is true, this would suggest that the problem with blurred vision is one associated with refraction of light, not associated with a neurologic cause of visual changes. The patient seems to have daily headaches that she relates to eye strain. The patient will be setup for MRI evaluation of the brain. Blood work will be done today. If the studies are unremarkable, the patient may require further ophthalmologic evaluation to determine any other measures that may help correct the vision in the left eye.  VER study apparently was already done. ? Results appear to be normal.  Jill Alexanders MD 09/25/2016 8:32 AM  [facility] [address] [place], [place] [address]  Phone [phone] Fax [phone]

## 2016-09-26 LAB — SEDIMENTATION RATE: Sed Rate: 2 mm/hr (ref 0–40)

## 2016-09-26 LAB — C-REACTIVE PROTEIN: CRP: 4.8 mg/L (ref 0.0–4.9)

## 2016-09-26 LAB — VITAMIN B12: Vitamin B-12: 883 pg/mL (ref 211–946)

## 2016-10-03 ENCOUNTER — Telehealth: Payer: Self-pay | Admitting: Neurology

## 2016-10-03 NOTE — Telephone Encounter (Signed)
I am working on [employer] for her MRI Brain w/wo contrast and they need a peer to peer. The phone # [phone]. The information that they will need is the patient name her DOB 1961-09-23 and the insurance ID which is [id number] Please call in the next 2 business days. Thank you

## 2016-10-05 NOTE — Telephone Encounter (Signed)
I called for peer to peer review, the MRI of the brain has been approved, approval number is [id number] good through 11/01/2016.

## 2016-10-08 ENCOUNTER — Telehealth: Payer: Self-pay | Admitting: Neurology

## 2016-10-08 NOTE — Telephone Encounter (Signed)
Patient is returning a call to schedule an MRI.

## 2016-10-09 NOTE — Telephone Encounter (Signed)
Thank you Sharon Huffman scheduled her MRI for 10/17/16.

## 2016-10-10 NOTE — Telephone Encounter (Signed)
Patient has MRI scheduled at [facility] on 10/17/16

## 2016-10-17 ENCOUNTER — Ambulatory Visit (INDEPENDENT_AMBULATORY_CARE_PROVIDER_SITE_OTHER): Payer: BLUE CROSS/BLUE SHIELD

## 2016-10-17 DIAGNOSIS — G44221 Chronic tension-type headache, intractable: Secondary | ICD-10-CM

## 2016-10-17 DIAGNOSIS — H539 Unspecified visual disturbance: Secondary | ICD-10-CM | POA: Diagnosis not present

## 2016-10-18 MED ORDER — GADOPENTETATE DIMEGLUMINE 469.01 MG/ML IV SOLN: 18.0000 mL | Freq: Once | INTRAVENOUS | Status: DC | PRN

## 2016-10-19 ENCOUNTER — Telehealth: Payer: Self-pay | Admitting: Neurology

## 2016-10-19 NOTE — Telephone Encounter (Signed)
I called patient. The MRI does show some changes in the brainstem associated with blood pressure issues, the patient is on medication for blood pressure.  No abnormalities around the eye area.   MRI brain 10/18/16:  IMPRESSION:  This MRI of the brain with and without contrast shows the following: 1.   There are T2/FLAIR hyperintense foci in the pons bilaterally. However, no foci are noted in the cerebral hemispheres.   Although the isolated pontine foci could be related to chronic microvascular ischemic change, they could also represent changes from a metabolic process. Demyelination is unlikely. 2.   There is a normal enhancement pattern. There are no acute fin18

## 2016-10-19 NOTE — Telephone Encounter (Signed)
Patient called to request results of MRI, please call [phone].

## 2016-10-19 NOTE — Telephone Encounter (Signed)
I called patient. MRI the brain by my reading appears been unremarkable. The formal reading is not yet available. The study was done with and without gadolinium enhancement. The patient has visual acuity problems on one eye, the problem likely is intraocular.  I will call the patient back if the formal reading is different from what I have told her.

## 2016-11-06 DIAGNOSIS — F17200 Nicotine dependence, unspecified, uncomplicated: Secondary | ICD-10-CM | POA: Diagnosis not present

## 2016-11-06 DIAGNOSIS — F339 Major depressive disorder, recurrent, unspecified: Secondary | ICD-10-CM | POA: Diagnosis not present

## 2016-11-06 DIAGNOSIS — F900 Attention-deficit hyperactivity disorder, predominantly inattentive type: Secondary | ICD-10-CM | POA: Diagnosis not present

## 2016-11-06 DIAGNOSIS — M5441 Lumbago with sciatica, right side: Secondary | ICD-10-CM | POA: Diagnosis not present

## 2016-12-26 DIAGNOSIS — H5462 Unqualified visual loss, left eye, normal vision right eye: Secondary | ICD-10-CM | POA: Diagnosis not present

## 2017-01-22 DIAGNOSIS — H43821 Vitreomacular adhesion, right eye: Secondary | ICD-10-CM | POA: Diagnosis not present

## 2017-01-22 DIAGNOSIS — H2513 Age-related nuclear cataract, bilateral: Secondary | ICD-10-CM | POA: Diagnosis not present

## 2017-01-22 DIAGNOSIS — H43811 Vitreous degeneration, right eye: Secondary | ICD-10-CM | POA: Diagnosis not present

## 2017-02-06 DIAGNOSIS — F988 Other specified behavioral and emotional disorders with onset usually occurring in childhood and adolescence: Secondary | ICD-10-CM | POA: Diagnosis not present

## 2017-02-06 DIAGNOSIS — Z79899 Other long term (current) drug therapy: Secondary | ICD-10-CM | POA: Diagnosis not present

## 2017-02-06 DIAGNOSIS — F339 Major depressive disorder, recurrent, unspecified: Secondary | ICD-10-CM | POA: Diagnosis not present

## 2017-03-18 DIAGNOSIS — J45909 Unspecified asthma, uncomplicated: Secondary | ICD-10-CM | POA: Diagnosis not present

## 2017-03-18 DIAGNOSIS — J4521 Mild intermittent asthma with (acute) exacerbation: Secondary | ICD-10-CM | POA: Diagnosis not present

## 2017-04-02 DIAGNOSIS — H18411 Arcus senilis, right eye: Secondary | ICD-10-CM | POA: Diagnosis not present

## 2017-04-02 DIAGNOSIS — H2513 Age-related nuclear cataract, bilateral: Secondary | ICD-10-CM | POA: Diagnosis not present

## 2017-04-02 DIAGNOSIS — H25013 Cortical age-related cataract, bilateral: Secondary | ICD-10-CM | POA: Diagnosis not present

## 2017-04-02 DIAGNOSIS — H2512 Age-related nuclear cataract, left eye: Secondary | ICD-10-CM | POA: Diagnosis not present

## 2017-04-02 DIAGNOSIS — H25043 Posterior subcapsular polar age-related cataract, bilateral: Secondary | ICD-10-CM | POA: Diagnosis not present

## 2017-04-29 DIAGNOSIS — H2512 Age-related nuclear cataract, left eye: Secondary | ICD-10-CM | POA: Diagnosis not present

## 2017-04-29 DIAGNOSIS — H2513 Age-related nuclear cataract, bilateral: Secondary | ICD-10-CM | POA: Diagnosis not present

## 2017-04-30 DIAGNOSIS — H2511 Age-related nuclear cataract, right eye: Secondary | ICD-10-CM | POA: Diagnosis not present

## 2017-05-07 DIAGNOSIS — I10 Essential (primary) hypertension: Secondary | ICD-10-CM | POA: Diagnosis not present

## 2017-05-07 DIAGNOSIS — M5441 Lumbago with sciatica, right side: Secondary | ICD-10-CM | POA: Diagnosis not present

## 2017-05-07 DIAGNOSIS — F900 Attention-deficit hyperactivity disorder, predominantly inattentive type: Secondary | ICD-10-CM | POA: Diagnosis not present

## 2017-05-07 DIAGNOSIS — M5442 Lumbago with sciatica, left side: Secondary | ICD-10-CM | POA: Diagnosis not present

## 2017-05-13 DIAGNOSIS — H2511 Age-related nuclear cataract, right eye: Secondary | ICD-10-CM | POA: Diagnosis not present

## 2017-05-13 DIAGNOSIS — H2513 Age-related nuclear cataract, bilateral: Secondary | ICD-10-CM | POA: Diagnosis not present

## 2017-05-29 DIAGNOSIS — R300 Dysuria: Secondary | ICD-10-CM | POA: Diagnosis not present

## 2017-05-29 DIAGNOSIS — N390 Urinary tract infection, site not specified: Secondary | ICD-10-CM | POA: Diagnosis not present

## 2017-07-15 DIAGNOSIS — Z0000 Encounter for general adult medical examination without abnormal findings: Secondary | ICD-10-CM | POA: Diagnosis not present

## 2017-07-15 DIAGNOSIS — N390 Urinary tract infection, site not specified: Secondary | ICD-10-CM | POA: Diagnosis not present

## 2017-07-18 DIAGNOSIS — Z780 Asymptomatic menopausal state: Secondary | ICD-10-CM | POA: Diagnosis not present

## 2017-07-18 DIAGNOSIS — R002 Palpitations: Secondary | ICD-10-CM | POA: Diagnosis not present

## 2017-07-18 DIAGNOSIS — M545 Low back pain: Secondary | ICD-10-CM | POA: Diagnosis not present

## 2017-07-18 DIAGNOSIS — S299XXA Unspecified injury of thorax, initial encounter: Secondary | ICD-10-CM | POA: Diagnosis not present

## 2017-07-18 DIAGNOSIS — Z8249 Family history of ischemic heart disease and other diseases of the circulatory system: Secondary | ICD-10-CM | POA: Diagnosis not present

## 2017-07-18 DIAGNOSIS — S3992XA Unspecified injury of lower back, initial encounter: Secondary | ICD-10-CM | POA: Diagnosis not present

## 2017-07-18 DIAGNOSIS — Z8543 Personal history of malignant neoplasm of ovary: Secondary | ICD-10-CM | POA: Diagnosis not present

## 2017-07-18 DIAGNOSIS — M5442 Lumbago with sciatica, left side: Secondary | ICD-10-CM | POA: Diagnosis not present

## 2017-07-18 DIAGNOSIS — M546 Pain in thoracic spine: Secondary | ICD-10-CM | POA: Diagnosis not present

## 2017-07-18 DIAGNOSIS — Z0000 Encounter for general adult medical examination without abnormal findings: Secondary | ICD-10-CM | POA: Diagnosis not present

## 2017-08-09 DIAGNOSIS — Z6829 Body mass index (BMI) 29.0-29.9, adult: Secondary | ICD-10-CM | POA: Diagnosis not present

## 2017-08-09 DIAGNOSIS — M4310 Spondylolisthesis, site unspecified: Secondary | ICD-10-CM | POA: Diagnosis not present

## 2017-08-09 DIAGNOSIS — I10 Essential (primary) hypertension: Secondary | ICD-10-CM | POA: Diagnosis not present

## 2017-08-16 DIAGNOSIS — M4316 Spondylolisthesis, lumbar region: Secondary | ICD-10-CM | POA: Diagnosis not present

## 2017-08-16 DIAGNOSIS — R293 Abnormal posture: Secondary | ICD-10-CM | POA: Diagnosis not present

## 2017-08-16 DIAGNOSIS — M545 Low back pain: Secondary | ICD-10-CM | POA: Diagnosis not present

## 2017-08-16 DIAGNOSIS — M2560 Stiffness of unspecified joint, not elsewhere classified: Secondary | ICD-10-CM | POA: Diagnosis not present

## 2017-08-21 DIAGNOSIS — R293 Abnormal posture: Secondary | ICD-10-CM | POA: Diagnosis not present

## 2017-08-21 DIAGNOSIS — M4316 Spondylolisthesis, lumbar region: Secondary | ICD-10-CM | POA: Diagnosis not present

## 2017-08-21 DIAGNOSIS — M2560 Stiffness of unspecified joint, not elsewhere classified: Secondary | ICD-10-CM | POA: Diagnosis not present

## 2017-08-21 DIAGNOSIS — M545 Low back pain: Secondary | ICD-10-CM | POA: Diagnosis not present

## 2017-08-28 DIAGNOSIS — M545 Low back pain: Secondary | ICD-10-CM | POA: Diagnosis not present

## 2017-08-28 DIAGNOSIS — M4316 Spondylolisthesis, lumbar region: Secondary | ICD-10-CM | POA: Diagnosis not present

## 2017-08-28 DIAGNOSIS — R293 Abnormal posture: Secondary | ICD-10-CM | POA: Diagnosis not present

## 2017-08-28 DIAGNOSIS — M2560 Stiffness of unspecified joint, not elsewhere classified: Secondary | ICD-10-CM | POA: Diagnosis not present

## 2017-09-13 DIAGNOSIS — Z6829 Body mass index (BMI) 29.0-29.9, adult: Secondary | ICD-10-CM | POA: Diagnosis not present

## 2017-09-13 DIAGNOSIS — M48062 Spinal stenosis, lumbar region with neurogenic claudication: Secondary | ICD-10-CM | POA: Diagnosis not present

## 2017-09-13 DIAGNOSIS — I10 Essential (primary) hypertension: Secondary | ICD-10-CM | POA: Diagnosis not present

## 2017-09-13 DIAGNOSIS — M4310 Spondylolisthesis, site unspecified: Secondary | ICD-10-CM | POA: Diagnosis not present

## 2017-09-17 DIAGNOSIS — F411 Generalized anxiety disorder: Secondary | ICD-10-CM | POA: Diagnosis not present

## 2017-09-20 DIAGNOSIS — M48062 Spinal stenosis, lumbar region with neurogenic claudication: Secondary | ICD-10-CM | POA: Diagnosis not present

## 2017-10-16 DIAGNOSIS — F411 Generalized anxiety disorder: Secondary | ICD-10-CM | POA: Diagnosis not present

## 2017-10-16 DIAGNOSIS — I10 Essential (primary) hypertension: Secondary | ICD-10-CM | POA: Diagnosis not present

## 2018-02-03 DIAGNOSIS — F339 Major depressive disorder, recurrent, unspecified: Secondary | ICD-10-CM | POA: Diagnosis not present

## 2018-02-03 DIAGNOSIS — F900 Attention-deficit hyperactivity disorder, predominantly inattentive type: Secondary | ICD-10-CM | POA: Diagnosis not present

## 2018-04-15 DIAGNOSIS — F411 Generalized anxiety disorder: Secondary | ICD-10-CM | POA: Diagnosis not present

## 2018-04-15 DIAGNOSIS — F900 Attention-deficit hyperactivity disorder, predominantly inattentive type: Secondary | ICD-10-CM | POA: Diagnosis not present

## 2018-04-15 DIAGNOSIS — I10 Essential (primary) hypertension: Secondary | ICD-10-CM | POA: Diagnosis not present

## 2018-07-21 DIAGNOSIS — I10 Essential (primary) hypertension: Secondary | ICD-10-CM | POA: Diagnosis not present

## 2018-07-21 DIAGNOSIS — Z0000 Encounter for general adult medical examination without abnormal findings: Secondary | ICD-10-CM | POA: Diagnosis not present

## 2018-07-21 DIAGNOSIS — E559 Vitamin D deficiency, unspecified: Secondary | ICD-10-CM | POA: Diagnosis not present

## 2018-07-21 DIAGNOSIS — E7800 Pure hypercholesterolemia, unspecified: Secondary | ICD-10-CM | POA: Diagnosis not present

## 2018-07-23 DIAGNOSIS — Z0000 Encounter for general adult medical examination without abnormal findings: Secondary | ICD-10-CM | POA: Diagnosis not present

## 2018-08-04 DIAGNOSIS — Z6828 Body mass index (BMI) 28.0-28.9, adult: Secondary | ICD-10-CM | POA: Diagnosis not present

## 2018-08-04 DIAGNOSIS — Z01419 Encounter for gynecological examination (general) (routine) without abnormal findings: Secondary | ICD-10-CM | POA: Diagnosis not present

## 2018-09-30 DIAGNOSIS — E039 Hypothyroidism, unspecified: Secondary | ICD-10-CM | POA: Diagnosis not present

## 2018-10-09 ENCOUNTER — Institutional Professional Consult (permissible substitution): Payer: BLUE CROSS/BLUE SHIELD | Admitting: Neurology

## 2018-10-23 DIAGNOSIS — F900 Attention-deficit hyperactivity disorder, predominantly inattentive type: Secondary | ICD-10-CM | POA: Diagnosis not present

## 2018-10-23 DIAGNOSIS — Z79899 Other long term (current) drug therapy: Secondary | ICD-10-CM | POA: Diagnosis not present

## 2018-10-23 DIAGNOSIS — Z23 Encounter for immunization: Secondary | ICD-10-CM | POA: Diagnosis not present

## 2018-10-23 DIAGNOSIS — F411 Generalized anxiety disorder: Secondary | ICD-10-CM | POA: Diagnosis not present

## 2018-10-23 DIAGNOSIS — W57XXXA Bitten or stung by nonvenomous insect and other nonvenomous arthropods, initial encounter: Secondary | ICD-10-CM | POA: Diagnosis not present

## 2018-10-23 DIAGNOSIS — I10 Essential (primary) hypertension: Secondary | ICD-10-CM | POA: Diagnosis not present

## 2018-11-19 DIAGNOSIS — E039 Hypothyroidism, unspecified: Secondary | ICD-10-CM | POA: Diagnosis not present

## 2018-12-31 DIAGNOSIS — R6889 Other general symptoms and signs: Secondary | ICD-10-CM | POA: Diagnosis not present

## 2018-12-31 DIAGNOSIS — J019 Acute sinusitis, unspecified: Secondary | ICD-10-CM | POA: Diagnosis not present

## 2019-01-13 DIAGNOSIS — J45909 Unspecified asthma, uncomplicated: Secondary | ICD-10-CM | POA: Diagnosis not present

## 2019-01-13 DIAGNOSIS — J9801 Acute bronchospasm: Secondary | ICD-10-CM | POA: Diagnosis not present

## 2019-02-04 DIAGNOSIS — E039 Hypothyroidism, unspecified: Secondary | ICD-10-CM | POA: Diagnosis not present

## 2019-02-04 DIAGNOSIS — R05 Cough: Secondary | ICD-10-CM | POA: Diagnosis not present

## 2019-02-04 DIAGNOSIS — R5383 Other fatigue: Secondary | ICD-10-CM | POA: Diagnosis not present

## 2019-02-18 ENCOUNTER — Institutional Professional Consult (permissible substitution): Payer: BLUE CROSS/BLUE SHIELD | Admitting: Neurology

## 2019-03-02 DIAGNOSIS — J4521 Mild intermittent asthma with (acute) exacerbation: Secondary | ICD-10-CM | POA: Diagnosis not present

## 2019-03-02 DIAGNOSIS — I10 Essential (primary) hypertension: Secondary | ICD-10-CM | POA: Diagnosis not present

## 2019-03-17 ENCOUNTER — Telehealth: Payer: Self-pay | Admitting: Internal Medicine

## 2019-03-17 NOTE — Telephone Encounter (Signed)
Called and spoke with patient regarding MW is okay with keeping in house ov 03/20/19 Pt expressed and verbalized understanding Nothing further needed

## 2019-03-17 NOTE — Telephone Encounter (Signed)
Called and spoke with patient.   1. Patient has not been around anyone who has been confirmed or suspected to have COVID19 2. Patient has no new onset of symptoms that range from fever, shortness of breath, cough, and gasping for air.  3. Patient has not traveled internationally in the last 30 days.  4. Patient has not traveled outside out of [place] in the last 30 days.   Patient is being seen for a consult for Shortness of breath. Started in December thought had the flu but tests were negative. Patient stated that this is on going. This what her appointment is for. Patient gasps for air and it causes her to cough.   She is screened and cleared to come for her consult. Will route to MW  As Juluis Rainier.

## 2019-03-17 NOTE — Telephone Encounter (Signed)
Sounds fine for in house consult, thx

## 2019-03-20 ENCOUNTER — Ambulatory Visit (INDEPENDENT_AMBULATORY_CARE_PROVIDER_SITE_OTHER): Payer: BLUE CROSS/BLUE SHIELD

## 2019-03-20 ENCOUNTER — Institutional Professional Consult (permissible substitution): Payer: BLUE CROSS/BLUE SHIELD | Admitting: Emergency Medicine

## 2019-03-20 ENCOUNTER — Other Ambulatory Visit: Payer: Self-pay

## 2019-03-20 ENCOUNTER — Ambulatory Visit (INDEPENDENT_AMBULATORY_CARE_PROVIDER_SITE_OTHER): Payer: BLUE CROSS/BLUE SHIELD | Admitting: Internal Medicine

## 2019-03-20 ENCOUNTER — Encounter: Payer: Self-pay | Admitting: Internal Medicine

## 2019-03-20 VITALS — BP 122/72 | HR 84 | Ht 65.0 in | Wt 173.8 lb

## 2019-03-20 DIAGNOSIS — J45991 Cough variant asthma: Secondary | ICD-10-CM

## 2019-03-20 DIAGNOSIS — R0609 Other forms of dyspnea: Secondary | ICD-10-CM | POA: Diagnosis not present

## 2019-03-20 DIAGNOSIS — R0600 Dyspnea, unspecified: Secondary | ICD-10-CM

## 2019-03-20 DIAGNOSIS — R05 Cough: Secondary | ICD-10-CM | POA: Diagnosis not present

## 2019-03-20 MED ORDER — FAMOTIDINE 20 MG PO TABS: ORAL_TABLET | ORAL | 11 refills | Status: AC

## 2019-03-20 MED ORDER — MOMETASONE FURO-FORMOTEROL FUM 100-5 MCG/ACT IN AERO: 2.0000 | INHALATION_SPRAY | Freq: Two times a day (BID) | RESPIRATORY_TRACT | 0 refills | Status: DC

## 2019-03-20 MED ORDER — PANTOPRAZOLE SODIUM 40 MG PO TBEC: 40.0000 mg | DELAYED_RELEASE_TABLET | Freq: Every day | ORAL | 2 refills | Status: DC

## 2019-03-20 NOTE — Patient Instructions (Addendum)
Pantoprazole (protonix) 40 mg   Take  30-60 min before first meal of the day and Pepcid (famotidine)  20 mg one hour after supper  until return to office - this is the best way to tell whether stomach acid is contributing to your problem.    GERD (REFLUX)  is an extremely common cause of respiratory symptoms just like yours , many times with no obvious heartburn at all.    It can be treated with medication, but also with lifestyle changes including elevation of the head of your bed (ideally with 6 -8inch blocks under the headboard of your bed),  Smoking cessation, avoidance of late meals, excessive alcohol, and avoid fatty foods, chocolate, peppermint, colas, red wine, and acidic juices such as orange juice.  NO MINT OR MENTHOL PRODUCTS SO NO COUGH DROPS -  NO ACT  USE SUGARLESS CANDY INSTEAD (Jolley ranchers or Stover's or Life Savers) or even ice chips will also do - the key is to swallow to prevent all throat clearing. NO OIL BASED VITAMINS - use powdered substitutes.  Avoid fish oil when coughing.  If not improving Dulera 100  Up to 1-2 pffs every 12 hours as needed for cough/ wheeze/ short of breath but in theory should not need it.  Please remember to go to the  x-ray department  for your tests - we will call you with the results when they are available    Schedule a televist in 2 weeks to check on your  Progress - call us in meantime if needed

## 2019-03-20 NOTE — Progress Notes (Signed)
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 quit light smoking 11/27/18 with allergies as far back as she can remember with rhinitis spring treated with otc's and never needed any allergy testing with cough onset her late teens while in [place]  with dx of Lyme dz 2015 = fever, ha, rash, arthritis  > rx doxy helped but still having intermittent arthritis/ fatigue > [facility] eval not helpful > rheumatology eval neg > fatigue/arthritis and limited with activity and quit smoking "to help her husband quit" then around late Jan 2020 with doe, worse fatigue, mild chest fatigue, and chronic is worse and only transiently better with depomedrol /pred / albuterol worked better  than anything else so referred to pulmonary clinic 03/20/2019 by Sharon Huffman office.   History of Present Illness  03/20/2019  Pulmonary/ 1st office eval/Sharon Huffman  Chief Complaint  Patient presents with   pulm consult    Pt has increase SOB, chest tightness, dry cough for last 2 months.   Dyspnea:  Doe 45 - 100 ft nl pace / fight of steps difficult / 5 min housework Cough: w/in 15 min of stirring of cough dry / slt pink mucus x one week// sense of pnds but no nasal discharge  Assoc band like chest tightness and midline cp with coughing fits  Sleep: flat in bed one pillow  SABA use: smidge better / last alb x 24 h prior to OV   No h/o rx for ovarian ca (limited dx to capsule) Mouth dry / ACT lozenges  Cough worse around juniper trees / if holds cats closely   No obvious other patterns in  day to day or daytime variability or assoc excess/ purulent sputum or mucus plugs   subjective wheeze or overt sinus or hb symptoms.   Sleeps as above  without nocturnal  or early am exacerbation  of respiratory  c/o's or need for noct saba. Also denies any obvious fluctuation of symptoms with weather or environmental changes or other aggravating or alleviating factors except as outlined  above   No unusual exposure hx or h/o childhood pna/ asthma or knowledge of premature birth.  Current Allergies, Complete Past Medical History, Past Surgical History, Family History, and Social History were reviewed in [employer] record.  ROS  The following are not active complaints unless bolded Hoarseness, sore throat, dysphagia, dental problems, itching, sneezing,  nasal congestion or discharge of excess mucus or purulent secretions, ear ache,   fever, chills, sweats, unintended wt loss or wt gain, classically pleuritic or exertional cp,  orthopnea pnd or arm/hand swelling  or leg swelling, presyncope, palpitations, abdominal pain, anorexia, nausea, vomiting, diarrhea  or change in bowel habits or change in bladder habits, change in stools or change in urine, dysuria, hematuria,  rash, arthralgias, visual complaints, headache, numbness, weakness or ataxia or problems with walking or coordination,  change in mood or  memory.           Past Medical History:  Diagnosis Date   Bronchitis, chronic (HCC)    Lyme disease    Migraine    Ovarian cancer ([place])     Outpatient Medications Prior to Visit  Medication Sig Dispense Refill   ADDERALL XR 30 MG 24 hr capsule USE AS DIRECTED EVERY DAY  0   atenolol (TENORMIN) 50 MG tablet Take 50 mg by mouth daily.  CRESTOR 10 MG tablet Take 5 mg by mouth daily.  3   cyclobenzaprine (FLEXERIL) 10 MG tablet Take 1 tablet (10 mg total) by mouth at bedtime. As needed for pain 60 tablet 0   FLUoxetine (PROZAC) 20 MG capsule Take 20 mg by mouth daily.     traMADol (ULTRAM) 50 MG tablet Take 1 tablet (50 mg total) by mouth 2 (two) times daily. 30 tablet 0   zolpidem (AMBIEN) 10 MG tablet Take 10 mg by mouth at bedtime as needed for sleep.        Objective:     BP 122/72 (BP Location: Left Arm, Cuff Size: Normal)    Pulse 84    Ht 5\' 5"  (1.651 m)    Wt 173 lb 12.8 oz (78.8 kg)    SpO2 98%    BMI 28.92 kg/m   SpO2: 98  %   RA  Pleasant amb coughs at the end of exp  HEENT: nl dentition, turbinates bilaterally, and oropharynx. Nl external ear canals without cough reflex   NECK :  without JVD/Nodes/TM/ nl carotid upstrokes bilaterally   LUNGS: no acc muscle use,  Nl contour chest which is clear to A and P bilaterally    CV:  RRR  no s3 or murmur or increase in P2, and no edema   ABD:  soft and nontender with nl inspiratory excursion in the supine position. No bruits or organomegaly appreciated, bowel sounds nl  MS:  Nl gait/ ext warm without deformities, calf tenderness, cyanosis or clubbing No obvious joint restrictions   SKIN: warm and dry without lesions    NEURO:  alert, approp, nl sensorium with  no motor or cerebellar deficits apparent.                   Assessment   Cough variant asthma vs uacs Quit smoking 11/2018  - 03/20/2019  After extensive coaching inhaler device,  effectiveness =    50% from a basline of 25% > try dulera 100 up to 2 pffs every 12 h prn  - max rx for gerd 03/20/2019 >>>   Onset of symptoms after stopped all smoking and home smoke exposure is an unusual hx and favors uacs over asthma related cough but could have elements of both - if she does have asthma it is quite mild    1) UACS =  Upper airway cough syndrome (previously labeled PNDS),  is so named because it's frequently impossible to sort out how much is  CR/sinusitis with freq throat clearing (which can be related to primary GERD)   vs  causing  secondary (" extra esophageal")  GERD from wide swings in gastric pressure that occur with throat clearing, often  promoting self use of mint and menthol lozenges that reduce the lower esophageal sphincter tone and exacerbate the problem further in a cyclical fashion.   These are the same pts (now being labeled as having "irritable larynx syndrome" by some cough centers) who not infrequently have a history of having failed to tolerate ace inhibitors,  dry powder  inhalers or biphosphonates or report having atypical/extraesophageal reflux symptoms that don't respond to standard doses of PPI  and are easily confused as having aecopd or asthma flares by even experienced allergists/ pulmonologists (myself included).  - Of the three most common causes of  Sub-acute / recurrent or chronic cough, only one (GERD)  can actually contribute to/ trigger  the other two (asthma and post nasal drip syndrome)  and perpetuate the cylce of cough.  While not intuitively obvious, many patients with chronic low grade reflux do not cough until there is a primary insult that disturbs the protective epithelial barrier and exposes sensitive nerve endings.   This is typically viral but can due to PNDS and  either may apply here.     >>> The point is that once this occurs, it is difficult to eliminate the cycle  using anything but a maximally effective acid suppression regimen at least in the short run, accompanied by an appropriate diet to address non acid GERD and control / eliminate the cough itself starting with otc's like delsym and adding tramadol if needed as already has used for arthritis in past.   2) Mild cough variant asthma: Based on two studies from NEJM  378; 20 p 1865 (2018) and 380 : p2020-30 (2019) in pts with mild asthma it is reasonable to use low dose symbicort eg 80 2bid "prn" flare (and by inference dulera 100)  in this setting  and takes advantage of the rapid onset of action but is not the same as "rescue therapy"  - it  can be stopped once the acute symptoms have resolved and the need for rescue has been minimized (< 2 x weekly)     DOE (dyspnea on exertion) 03/20/2019   Walked RA  3 laps @  approx 210ft each @ avg pace  stopped due to  Sob/ dizzy but 02 sats never  pulse never over 100 <  96% on Tenormin 50 mg daily   Symptoms are markedly disproportionate to objective findings and not clear to what extent this is actually a pulmonary  problem but pt does appear to  have difficult to sort out respiratory symptoms of unknown origin for which  DDX  = almost all start with A and  include Adherence, Ace Inhibitors, Acid Reflux, Active Sinus Disease, Alpha 1 Antitripsin deficiency, Anxiety masquerading as Airways dz,  ABPA,  Allergy(esp in young), Aspiration (esp in elderly), Adverse effects of meds,  Active smoking or Vaping, A bunch of PE's/clot burden (a few small clots can't cause this syndrome unless there is already severe underlying pulm or vascular dz with poor reserve),  Anemia or thyroid disorder, plus two Bs  = Bronchiectasis and Beta blocker use..and one C= CHF     Adherence is always the initial "prime suspect" and is a multilayered concern that requires a "trust but verify" approach in every patient - starting with knowing how to use medications, especially inhalers, correctly, keeping up with refills and understanding the fundamental difference between maintenance and prns vs those medications only taken for a very short course and then stopped and not refilled.  - see hfa teaching - return with all meds in hand using a trust but verify approach to confirm accurate Medication  Reconciliation The principal here is that until we are certain that the  patients are doing what we've asked, it makes no sense to ask them to do more.    ? Acid (or non-acid) GERD > always difficult to exclude as up to 75% of pts in some series report no assoc GI/ Heartburn symptoms and this may explain her cough and midline cp  > rec max (24h)  acid suppression and diet restrictions/ reviewed and instructions given in writing.    ? Allergy/ asthma >  Works at [employer] with clear cut symptoms with close exposure, consider formal allergy testing or referral if not controlled on  ICS (see sep a/p)  ? Adverse drug effects > none of the usual suspects listed.  ? Beta blocker effects > may explain some of her symptoms and may need to be adjusted down or   In the setting of  respiratory symptoms of unknown etiology, consider trial of  bystolic, the most beta -1  selective Beta blocker available in sample form, with bisoprolol the most selective generic choice  on the market, at least on a trial basis, to make sure the spillover Beta 2 effects of the less specific Beta blockers are not contributing to this patient's symptoms.     >>>  Will do televist in 2 weeks, asked her to call back sooner if not doing better        Total time devoted to counseling  > 50 % of initial 60 min office visit:  review case with pt/personally observe portions of 02 amb sat study and  device teaching which extended face to face time for this visit  discussion of options/alternatives/ personally creating written customized instructions  in presence of pt  then going over those specific  Instructions directly with the pt including how to use all of the meds but in particular covering each new medication in detail and the difference between the maintenance= "automatic" meds and the prns using an action plan format for the latter (If this problem/symptom => do that organization reading Left to right).  Please see AVS from this visit for a full list of these instructions which I personally wrote for this pt and  are unique to this visit.        Christinia Gully, MD 03/20/2019

## 2019-03-22 ENCOUNTER — Encounter: Payer: Self-pay | Admitting: Internal Medicine

## 2019-03-22 DIAGNOSIS — R0609 Other forms of dyspnea: Secondary | ICD-10-CM | POA: Insufficient documentation

## 2019-03-22 DIAGNOSIS — R0600 Dyspnea, unspecified: Secondary | ICD-10-CM | POA: Insufficient documentation

## 2019-03-22 NOTE — Assessment & Plan Note (Signed)
Quit smoking 11/2018  - 03/20/2019  After extensive coaching inhaler device,  effectiveness =    50% from a basline of 25% > try dulera 100 up to 2 pffs every 12 h prn  - max rx for gerd 03/20/2019 >>>   Onset of symptoms after stopped all smoking and home smoke exposure is an unusual hx and favors uacs over asthma related cough but could have elements of both - if she does have asthma it is quite mild    1) UACS =  Upper airway cough syndrome (previously labeled PNDS),  is so named because it's frequently impossible to sort out how much is  CR/sinusitis with freq throat clearing (which can be related to primary GERD)   vs  causing  secondary (" extra esophageal")  GERD from wide swings in gastric pressure that occur with throat clearing, often  promoting self use of mint and menthol lozenges that reduce the lower esophageal sphincter tone and exacerbate the problem further in a cyclical fashion.   These are the same pts (now being labeled as having "irritable larynx syndrome" by some cough centers) who not infrequently have a history of having failed to tolerate ace inhibitors,  dry powder inhalers or biphosphonates or report having atypical/extraesophageal reflux symptoms that don't respond to standard doses of PPI  and are easily confused as having aecopd or asthma flares by even experienced allergists/ pulmonologists (myself included).  - Of the three most common causes of  Sub-acute / recurrent or chronic cough, only one (GERD)  can actually contribute to/ trigger  the other two (asthma and post nasal drip syndrome)  and perpetuate the cylce of cough.  While not intuitively obvious, many patients with chronic low grade reflux do not cough until there is a primary insult that disturbs the protective epithelial barrier and exposes sensitive nerve endings.   This is typically viral but can due to PNDS and  either may apply here.     >>> The point is that once this occurs, it is difficult to eliminate the  cycle  using anything but a maximally effective acid suppression regimen at least in the short run, accompanied by an appropriate diet to address non acid GERD and control / eliminate the cough itself starting with otc's like delsym and adding tramadol if needed as already has used for arthritis in past.   2) Mild cough variant asthma: Based on two studies from NEJM  378; 20 p 1865 (2018) and 380 : p2020-30 (2019) in pts with mild asthma it is reasonable to use low dose symbicort eg 80 2bid "prn" flare (and by inference dulera 100)  in this setting  and takes advantage of the rapid onset of action but is not the same as "rescue therapy"  - it  can be stopped once the acute symptoms have resolved and the need for rescue has been minimized (< 2 x weekly)

## 2019-03-22 NOTE — Assessment & Plan Note (Signed)
03/20/2019   Walked RA  3 laps @  approx 244ft each @ avg pace  stopped due to  Sob/ dizzy but 02 sats never  pulse never over 100 <  96% on Tenormin 50 mg daily   Symptoms are markedly disproportionate to objective findings and not clear to what extent this is actually a pulmonary  problem but pt does appear to have difficult to sort out respiratory symptoms of unknown origin for which  DDX  = almost all start with A and  include Adherence, Ace Inhibitors, Acid Reflux, Active Sinus Disease, Alpha 1 Antitripsin deficiency, Anxiety masquerading as Airways dz,  ABPA,  Allergy(esp in young), Aspiration (esp in elderly), Adverse effects of meds,  Active smoking or Vaping, A bunch of PE's/clot burden (a few small clots can't cause this syndrome unless there is already severe underlying pulm or vascular dz with poor reserve),  Anemia or thyroid disorder, plus two Bs  = Bronchiectasis and Beta blocker use..and one C= CHF     Adherence is always the initial "prime suspect" and is a multilayered concern that requires a "trust but verify" approach in every patient - starting with knowing how to use medications, especially inhalers, correctly, keeping up with refills and understanding the fundamental difference between maintenance and prns vs those medications only taken for a very short course and then stopped and not refilled.  - see hfa teaching - return with all meds in hand using a trust but verify approach to confirm accurate Medication  Reconciliation The principal here is that until we are certain that the  patients are doing what we've asked, it makes no sense to ask them to do more.    ? Acid (or non-acid) GERD > always difficult to exclude as up to 75% of pts in some series report no assoc GI/ Heartburn symptoms and this may explain her cough and midline cp  > rec max (24h)  acid suppression and diet restrictions/ reviewed and instructions given in writing.    ? Allergy/ asthma >  Works at [employer] with  clear cut symptoms with close exposure, consider formal allergy testing or referral if not controlled on ICS (see sep a/p)  ? Adverse drug effects > none of the usual suspect listed.  ? Beta blocker effects > may explain some of her symptoms and may need to be adjusted down or   In the setting of respiratory symptoms of unknown etiology, consider trial of  bystolic, the most beta -1  selective Beta blocker available in sample form, with bisoprolol the most selective generic choice  on the market, at least on a trial basis, to make sure the spillover Beta 2 effects of the less specific Beta blockers are not contributing to this patient's symptoms.    Total time devoted to counseling  > 50 % of initial 60 min office visit:  review case with pt/personally observe portions of 02 amb sat study and  device teaching which extended face to face time for this visit  discussion of options/alternatives/ personally creating written customized instructions  in presence of pt  then going over those specific  Instructions directly with the pt including how to use all of the meds but in particular covering each new medication in detail and the difference between the maintenance= "automatic" meds and the prns using an action plan format for the latter (If this problem/symptom => do that organization reading Left to right).  Please see AVS from this visit for a  full list of these instructions which I personally wrote for this pt and  are unique to this visit.

## 2019-03-23 NOTE — Progress Notes (Signed)
Spoke with pt and notified of results per Dr. Wert. Pt verbalized understanding and denied any questions. 

## 2019-03-25 DIAGNOSIS — J029 Acute pharyngitis, unspecified: Secondary | ICD-10-CM | POA: Diagnosis not present

## 2019-03-25 DIAGNOSIS — B370 Candidal stomatitis: Secondary | ICD-10-CM | POA: Diagnosis not present

## 2019-03-25 DIAGNOSIS — Z87891 Personal history of nicotine dependence: Secondary | ICD-10-CM | POA: Diagnosis not present

## 2019-03-25 DIAGNOSIS — R05 Cough: Secondary | ICD-10-CM | POA: Diagnosis not present

## 2019-03-25 DIAGNOSIS — R002 Palpitations: Secondary | ICD-10-CM | POA: Diagnosis not present

## 2019-04-03 DIAGNOSIS — I10 Essential (primary) hypertension: Secondary | ICD-10-CM | POA: Diagnosis not present

## 2019-04-03 DIAGNOSIS — R05 Cough: Secondary | ICD-10-CM | POA: Diagnosis not present

## 2019-04-03 DIAGNOSIS — F329 Major depressive disorder, single episode, unspecified: Secondary | ICD-10-CM | POA: Diagnosis not present

## 2019-04-03 DIAGNOSIS — R0602 Shortness of breath: Secondary | ICD-10-CM | POA: Diagnosis not present

## 2019-04-06 ENCOUNTER — Other Ambulatory Visit: Payer: Self-pay

## 2019-04-06 ENCOUNTER — Telehealth: Payer: Self-pay

## 2019-04-06 ENCOUNTER — Ambulatory Visit (INDEPENDENT_AMBULATORY_CARE_PROVIDER_SITE_OTHER): Payer: BLUE CROSS/BLUE SHIELD | Admitting: Internal Medicine

## 2019-04-06 DIAGNOSIS — R0600 Dyspnea, unspecified: Secondary | ICD-10-CM

## 2019-04-06 DIAGNOSIS — J45991 Cough variant asthma: Secondary | ICD-10-CM | POA: Diagnosis not present

## 2019-04-06 DIAGNOSIS — R0609 Other forms of dyspnea: Secondary | ICD-10-CM

## 2019-04-06 NOTE — Progress Notes (Signed)
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 smoking 11/27/18 with allergies as far back as she can remember with rhinitis spring treated with otc's and never needed any allergy testing with cough onset her late teens while in [place]  with dx of Lyme dz 2015 = fever, ha, rash, arthritis  > rx doxy helped but still having intermittent arthritis/ fatigue > [facility] eval not helpful > rheumatology eval neg > fatigue/arthritis and limited with activity and quit smoking "to help her husband quit" then around late Jan 2020 with doe, worse fatigue, mild chest fatigue, and chronic is worse and only transiently better with depomedrol /pred / albuterol worked better  than anything else so referred to pulmonary clinic 03/20/2019 by Dr  Pennie Banter office.   History of Present Illness  03/20/2019  Pulmonary/ 1st office eval/Taevon Aschoff  Chief Complaint  Patient presents with  . pulm consult    Pt has increase SOB, chest tightness, dry cough for last 2 months.   Dyspnea:  [address] - 100 ft nl pace / fight of steps difficult / 5 min housework Cough: w/in 15 min of stirring of cough dry / slt pink mucus x one week// sense of pnds but no nasal discharge  Assoc band like chest tightness and midline cp with coughing fits  Sleep: flat in bed one pillow  SABA use: smidge better / last alb x 24 h prior to OV   No h/o rx for ovarian ca (limited dx to capsule) Mouth dry / ACT lozenges  Cough worse around juniper trees / if holds cats closely  rec Pantoprazole (protonix) 40 mg   Take  30-60 min before first meal of the day and Pepcid (famotidine)  20 mg one hour after supper  until return to office  GERD diet  If not improving Dulera 100  Up to 1-2 pffs every 12 hours as needed for cough/ wheeze/ short of breath but in theory should not need it.       Virtual Visit via Telephone Note 04/06/2019 re  uacs vs asthma/ unexplained doe  I connected with Sharon Huffman on 04/06/19 at  2:00 PM EDT by telephone and verified that I am speaking with the correct person using two identifiers.   I discussed the limitations, risks, security and privacy concerns of performing an evaluation and management service by telephone and the availability of in person appointments. I also discussed with the patient that there may be a patient responsible charge related to this service. The patient expressed understanding and agreed to proceed.   History of Present Illness:  Dyspnea:  yardwork more difficult   Cough: minimal s purulent sputum Sleeping: 2 pillows/ on side  SABA use: has albuterol, not using dulera now 02: none    No obvious day to day or daytime variability or assoc excess/ purulent sputum or mucus plugs or hemoptysis or cp or chest tightness, subjective wheeze or overt sinus or hb symptoms.    Also denies any obvious fluctuation of symptoms with weather or environmental changes or other aggravating or alleviating factors except as outlined above.   Meds reviewed/ med reconciliation completed      Observations/Objective: Good phonation, no coughing, talking in full sentences    Assessment and Plan: See problem list for active a/p's   Follow Up Instructions: See avs for instructions unique to this ov which includes  revised/ updated med list     I discussed the assessment and treatment plan with the patient. The patient was provided an opportunity to ask questions and all were answered. The patient agreed with the plan and demonstrated an understanding of the instructions.   The patient was advised to call back or seek an in-person evaluation if the symptoms worsen or if the condition fails to improve as anticipated.  I provided 23  minutes of non-face-to-face time during this encounter.   Christinia Gully, MD

## 2019-04-06 NOTE — Telephone Encounter (Signed)
Call made to patient to arrange 2 week f/u per MW AVS, patient states she will call back with this info. Nothing further is needed at this time.

## 2019-04-06 NOTE — Patient Instructions (Addendum)
Continue reflux medications/ diet and daily  sub-maximal exercise:    To get the most out of exercise, you need to be continuously aware that you are short of breath, but never out of breath, for 30 minutes daily. As you improve, it will actually be easier for you to do the same amount of exercise  in  30 minutes so always push to the level where you are short of breath.     If losing ground restart dulera 100 Take 2 puffs first thing in am and then another 2 puffs about 12 hours later.    Work on inhaler technique:  relax and gently blow all the way out then take a nice smooth deep breath back in, triggering the inhaler at same time you start breathing in.  Hold for up to 5 seconds if you can. Blow out thru nose. Rinse and gargle with water when done(if you time the inhaler right before you brush you teeth then you can use arm and hammer baking soda based toothpaste and brush your tongue and gargle with the slurry to prevent thrush)  Please schedule a follow up office visit in 2  weeks, sooner if needed  with all medications /inhalers/ solutions in hand so we can verify exactly what you are taking. This includes all medications from all doctors and over the counters

## 2019-04-08 ENCOUNTER — Encounter: Payer: Self-pay | Admitting: Internal Medicine

## 2019-04-08 NOTE — Assessment & Plan Note (Signed)
Chronic fatigue p lyme dz 2015 with new doe Jan 2020 03/20/2019   Walked RA  3 laps @  approx 231ft each @ avg pace  stopped due to  Sob/ dizzy but 02 sats never  pulse never over 100 <  96% on Tenormin 50 mg daily   >>>>rec submax ex daily / f/u in person in 2 weeks to complete the w/u, consider cpst

## 2019-04-08 NOTE — Assessment & Plan Note (Addendum)
Quit smoking 11/2018  - 03/20/2019  After extensive coaching inhaler device,  effectiveness =    50% from a basline of 25% > try dulera 100 up to 2 pffs every 12 h prn  - max rx for gerd 03/20/2019 >>>   ? Some better > if worse add back dulera 100 2 bid in case there is true asthma component here.  F/u in 2 weeks with all meds in hand using a trust but verify approach to confirm accurate Medication  Reconciliation The principal here is that until we are certain that the  patients are doing what we've asked, it makes no sense to ask them to do more.    Each maintenance medication was reviewed in detail including most importantly the difference between maintenance and as needed and under what circumstances the prns are to be used.  Please see AVS for specific  Instructions which are unique to this visit and I personally typed out  which were reviewed in detail  with the patient and a copy provided.

## 2019-04-09 ENCOUNTER — Telehealth: Payer: Self-pay | Admitting: *Deleted

## 2019-04-09 NOTE — Telephone Encounter (Signed)
-----   Message from Tanda Rockers, MD sent at 04/08/2019  6:46 AM EDT ----- Needs appt 1st week or two in may in person with all meds in hand

## 2019-04-09 NOTE — Telephone Encounter (Signed)
LMTCB

## 2019-04-10 DIAGNOSIS — R918 Other nonspecific abnormal finding of lung field: Secondary | ICD-10-CM | POA: Diagnosis not present

## 2019-04-10 NOTE — Telephone Encounter (Signed)
LMTCB

## 2019-04-13 NOTE — Telephone Encounter (Signed)
LMTCB

## 2019-04-17 DIAGNOSIS — R0602 Shortness of breath: Secondary | ICD-10-CM | POA: Diagnosis not present

## 2019-04-17 DIAGNOSIS — I10 Essential (primary) hypertension: Secondary | ICD-10-CM | POA: Diagnosis not present

## 2019-04-17 DIAGNOSIS — R05 Cough: Secondary | ICD-10-CM | POA: Diagnosis not present

## 2019-04-17 DIAGNOSIS — F329 Major depressive disorder, single episode, unspecified: Secondary | ICD-10-CM | POA: Diagnosis not present

## 2019-04-17 NOTE — Telephone Encounter (Signed)
LMTCB and will close per protocol  

## 2019-04-23 DIAGNOSIS — I10 Essential (primary) hypertension: Secondary | ICD-10-CM | POA: Diagnosis not present

## 2019-04-23 DIAGNOSIS — R0789 Other chest pain: Secondary | ICD-10-CM | POA: Diagnosis not present

## 2019-04-24 ENCOUNTER — Telehealth: Payer: Self-pay

## 2019-04-24 NOTE — Telephone Encounter (Signed)
Called patient to schedule NP appointment.

## 2019-05-08 ENCOUNTER — Ambulatory Visit: Payer: BLUE CROSS/BLUE SHIELD | Admitting: Cardiology

## 2019-05-08 ENCOUNTER — Other Ambulatory Visit: Payer: Self-pay

## 2019-05-08 ENCOUNTER — Encounter: Payer: Self-pay | Admitting: Cardiology

## 2019-05-08 VITALS — BP 124/78 | HR 85 | Temp 98.3°F | Ht 65.0 in | Wt 182.9 lb

## 2019-05-08 DIAGNOSIS — R0609 Other forms of dyspnea: Secondary | ICD-10-CM | POA: Diagnosis not present

## 2019-05-08 DIAGNOSIS — F329 Major depressive disorder, single episode, unspecified: Secondary | ICD-10-CM | POA: Insufficient documentation

## 2019-05-08 DIAGNOSIS — I10 Essential (primary) hypertension: Secondary | ICD-10-CM | POA: Diagnosis not present

## 2019-05-08 DIAGNOSIS — I209 Angina pectoris, unspecified: Secondary | ICD-10-CM

## 2019-05-08 DIAGNOSIS — E7800 Pure hypercholesterolemia, unspecified: Secondary | ICD-10-CM

## 2019-05-08 DIAGNOSIS — R0600 Dyspnea, unspecified: Secondary | ICD-10-CM

## 2019-05-08 DIAGNOSIS — R002 Palpitations: Secondary | ICD-10-CM | POA: Insufficient documentation

## 2019-05-08 DIAGNOSIS — F32A Depression, unspecified: Secondary | ICD-10-CM | POA: Insufficient documentation

## 2019-05-08 DIAGNOSIS — Z8249 Family history of ischemic heart disease and other diseases of the circulatory system: Secondary | ICD-10-CM

## 2019-05-08 MED ORDER — ISOSORBIDE MONONITRATE ER 30 MG PO TB24: 30.0000 mg | ORAL_TABLET | Freq: Every day | ORAL | 2 refills | Status: DC

## 2019-05-08 NOTE — Progress Notes (Signed)
Primary Physician/Referring:  Deland Pretty, MD  Patient ID: Sharon Huffman, female    DOB: 10-Mar-1961, 58 y.o.   MRN: [id number]  Chief Complaint  Patient presents with  . Chest Pain  . Shortness of Breath  . New Patient (Initial Visit)    HPI: Sharon Huffman  is a 58 y.o. female  with Very strong family history of premature coronary artery disease in her father had CAD at 50 Y and died at 7 Years of age, tobacco use quit in Dec 2019, 15-20 pack year history, hyperlipidemia.    Last 2-3 months has noticed chest discomfort in the middle of the chest, notices when she is active and doing physical exertional activities but also noted during stressful activities and is relieved at rest.  Episodes last 15-20 minutes after resting.  She also states that she's been under tremendous stress in view of Covid 19.  She also complains of dyspnea on exertion that is chronic.  She has been evaluated by pulmonary medicine including CT scan recently, I do not have the results Told to be normal At [facility].  She also thinks that she is suffering from limes disease.  She is marked fatigue.  Past Medical History:  Diagnosis Date  . ADHD   . Bronchitis, chronic ([place])   . Cervical cancer ([place])   . Depression   . Lyme disease   . Migraine   . Ovarian cancer ([employer])   . Palpitations     Past Surgical History:  Procedure Laterality Date  . ABDOMINAL HYSTERECTOMY  1994  . ANTERIOR CERVICAL DECOMP/DISCECTOMY FUSION  2000  . BREAST ENHANCEMENT SURGERY Bilateral   . EYE SURGERY  2016   PVD    Social History   Socioeconomic History  . Marital status: Married    Spouse name: Not on file  . Number of children: 0  . Years of education: 52  . Highest education level: Not on file  Occupational History  . Occupation: Chiropractor  Social Needs  . Financial resource strain: Not on file  . Food insecurity:    Worry: Not on file    Inability: Not on file  . Transportation needs:   Medical: Not on file    Non-medical: Not on file  Tobacco Use  . Smoking status: Former Smoker    Packs/day: 0.50    Years: 30.00    Pack years: 15.00    Types: Cigarettes    Start date: 12/18/1975    Last attempt to quit: 11/27/2018    Years since quitting: 0.4  . Smokeless tobacco: Never Used  Substance and Sexual Activity  . Alcohol use: Yes    Alcohol/week: 7.0 standard drinks    Types: 7 Glasses of wine per week    Comment: occas  . Drug use: No  . Sexual activity: Not on file    Comment: Married  Lifestyle  . Physical activity:    Days per week: Not on file    Minutes per session: Not on file  . Stress: Not on file  Relationships  . Social connections:    Talks on phone: Not on file    Gets together: Not on file    Attends religious service: Not on file    Active member of club or organization: Not on file    Attends meetings of clubs or organizations: Not on file    Relationship status: Not on file  . Intimate partner violence:  Fear of current or ex partner: Not on file    Emotionally abused: Not on file    Physically abused: Not on file    Forced sexual activity: Not on file  Other Topics Concern  . Not on file  Social History Narrative   Lives at home w/ her husband and mother w/ dementia   Right-handed   Caffeine: 3 cups coffee per day    Review of Systems  Constitution: Positive for malaise/fatigue. Negative for chills, decreased appetite and weight gain.  Cardiovascular: Positive for chest pain. Negative for dyspnea on exertion, leg swelling and syncope.  Respiratory: Positive for shortness of breath.   Endocrine: Negative for cold intolerance.  Hematologic/Lymphatic: Does not bruise/bleed easily.  Musculoskeletal: Positive for joint pain. Negative for joint swelling.  Gastrointestinal: Positive for heartburn. Negative for abdominal pain, anorexia, change in bowel habit, hematochezia and melena.  Neurological: Negative for headaches and  light-headedness.  Psychiatric/Behavioral: Negative for depression and substance abuse.  All other systems reviewed and are negative.     Objective  Blood pressure 124/78, pulse 85, temperature 98.3 F (36.8 C), height _0  (1.651 m), weight 182 lb 14.4 oz (83 kg), SpO2 97 %. Body mass index is 30.44 kg/m.    Physical Exam  Constitutional: She appears well-developed and well-nourished. No distress.  HENT:  Head: Atraumatic.  Eyes: Conjunctivae are normal.  Neck: Neck supple. No JVD present. No thyromegaly present.  Cardiovascular: Normal rate, regular rhythm, normal heart sounds and intact distal pulses. Exam reveals no gallop.  No murmur heard. Pulmonary/Chest: Effort normal and breath sounds normal.  Abdominal: Soft. Bowel sounds are normal.  Musculoskeletal: Normal range of motion.  Neurological: She is alert.  Skin: Skin is warm and dry.  Psychiatric: She has a normal mood and affect.   Radiology: No results found.  Laboratory examination:   Labs 02/04/2019: HB 13.4/HCT 41.2, platelets 153.  Normal indicis. BUN 18, creatinine 0.7, serum glucose 119 mg.  CMP otherwise normal.  Labs 03/09/2014: Normal CBC, CMP. Total cholesterol 254, triglycerides 208, HDL 51, LDL 161. Non-HDL cholesterol 20 3.  CMP Latest Ref Rng & Units 09/15/2014  Glucose 70 - 99 mg/dL 114(H)  BUN 6 - 23 mg/dL 9  Creatinine 0.50 - 1.10 mg/dL 0.70  Sodium 137 - 147 mEq/L 141  Potassium 3.7 - 5.3 mEq/L 3.6(L)  Chloride 96 - 112 mEq/L 97  CO2 19 - 32 mEq/L 29  Calcium 8.4 - 10.5 mg/dL 9.6   CBC Latest Ref Rng & Units 09/15/2014  WBC 4.0 - 10.5 K/uL 7.9  Hemoglobin 12.0 - 15.0 g/dL 13.3  Hematocrit 36.0 - 46.0 % 39.4  Platelets 150 - 400 K/uL 166   PRN Meds:. Medications Discontinued During This Encounter  Medication Reason  . atenolol (TENORMIN) 50 MG tablet Change in therapy  . mometasone-formoterol (DULERA) 100-5 MCG/ACT AERO No longer needed (for PRN medications)   Current Meds  Medication  Sig  . ADDERALL XR 25 MG 24 hr capsule 25 mg.   . Biotin 10 MG CAPS Take by mouth daily.  . Cholecalciferol (VITAMIN D-3) 25 MCG (1000 UT) CAPS Take by mouth daily.  . CRESTOR 10 MG tablet Take 10 mg by mouth daily.   . Cyanocobalamin (VITAMIN B-12 CR PO) Take 5,000 Units by mouth 2 (two) times a day.  . cyclobenzaprine (FLEXERIL) 10 MG tablet Take 1 tablet (10 mg total) by mouth at bedtime. As needed for pain  . famotidine (PEPCID) 20 MG tablet One after  supper  . FLUoxetine (PROZAC) 20 MG capsule Take 20 mg by mouth daily.  Marland Kitchen levothyroxine (SYNTHROID) 50 MCG tablet Take 50 mcg by mouth daily before breakfast.  . nebivolol (BYSTOLIC) 5 MG tablet Take 5 mg by mouth daily.  Marland Kitchen nystatin (MYCOSTATIN) 100000 UNIT/ML suspension Take 5 mLs by mouth 2 (two) times a day.  . pantoprazole (PROTONIX) 40 MG tablet Take 1 tablet (40 mg total) by mouth daily. Take 30-60 min before first meal of the day  . traMADol (ULTRAM) 50 MG tablet Take 1 tablet (50 mg total) by mouth 2 (two) times daily. (Patient taking differently: Take 50 mg by mouth 3 (three) times daily. )  . zolpidem (AMBIEN) 5 MG tablet Take 5 mg by mouth at bedtime as needed for sleep.     Cardiac Studies:    Treadmill Stress 04/05/2014: Indications: Screening for CAD.  Conclusions: Negative for ischemia. Normal exercise tolerence. Symptoms: Dyspnea. THR met. Arrhythmia: None. Continue primary prevention.  The patient exercised according to the Bruce protocol, Total time recorded 8 Min. 2 sec.achieving a max heart rate of 147 which was 88% of MPHR for age and 9.2 METS of work. Baseline NIBP was 132/82. Peak NIBP was 170/74 MaxSysp was: 180 MaxDiasp was: 82. The baseline ECG showed NSR,Borderline LVH. No ischemia. During exercise there wasno ST-T changes of ischemia.  Assessment   Angina pectoris (HCC) - Plan: EKG 12-Lead, PCV ECHOCARDIOGRAM COMPLETE, CT CORONARY MORPH W/CTA COR W/SCORE W/CA W/CM &/OR WO/CM, CT CORONARY FRACTIONAL FLOW  RESERVE DATA PREP, CT CORONARY FRACTIONAL FLOW RESERVE FLUID ANALYSIS, isosorbide mononitrate (IMDUR) 30 MG 24 hr tablet  DOE (dyspnea on exertion) - Plan: EKG 12-Lead, PCV ECHOCARDIOGRAM COMPLETE  Hypercholesteremia  Palpitations  Essential hypertension  Family history of premature CAD Father MI at age 55Y, died at 73Y  EKG 0/22/2020: Normal sinus rhythm at rate of 87 bpm, normal axis.  Early repolarization.  No evidence of ischemia, normal EKG.  Recommendations:   Patient with hyperlipidemia, 15+ pack year cigarette smoking history, family history of premature CAD presenting with exertional and stress-induced chest discomfort relieved with rest suggestive of angina pectoris.  Most appropriate recommendation will be to proceed with coronary CTA.She is at least a class III angina pectoris. She has been given sublingual nitroglycerin with dizziness but has not used it.  Encouraged her to use it and also placed her on isosorbide mononitrate 30 mg daily.  She also has palpitations with history of PACs and PVCs.  She is presently on Bystolic symptoms, and blood pressure is well controlled with regard to palpitations.  Lipids are being managed by PCP, I do not have recent labs but was told to be normal.  She has had chronic dyspnea and has been Evaluated by pulmonary medicine as well and this is chronic.  No PND or orthopnea, no clinical suggestion of CHF.   Adrian Prows, MD, [facility] 05/09/2019, 4:36 AM [facility] [place] Pager: [phone] Office: [phone] If no answer Cell [phone]

## 2019-05-14 NOTE — Telephone Encounter (Signed)
Please respond

## 2019-05-18 ENCOUNTER — Other Ambulatory Visit: Payer: Self-pay | Admitting: Cardiology

## 2019-05-18 DIAGNOSIS — I10 Essential (primary) hypertension: Secondary | ICD-10-CM

## 2019-05-19 ENCOUNTER — Other Ambulatory Visit: Payer: Self-pay | Admitting: Cardiology

## 2019-05-19 DIAGNOSIS — I10 Essential (primary) hypertension: Secondary | ICD-10-CM | POA: Diagnosis not present

## 2019-05-20 ENCOUNTER — Telehealth (HOSPITAL_COMMUNITY): Payer: Self-pay | Admitting: Emergency Medicine

## 2019-05-20 ENCOUNTER — Other Ambulatory Visit: Payer: Self-pay | Admitting: Cardiology

## 2019-05-20 DIAGNOSIS — Z119 Encounter for screening for infectious and parasitic diseases, unspecified: Secondary | ICD-10-CM

## 2019-05-20 LAB — BASIC METABOLIC PANEL
BUN/Creatinine Ratio: 19 (ref 9–23)
BUN: 15 mg/dL (ref 6–24)
CO2: 27 mmol/L (ref 20–29)
Calcium: 9.5 mg/dL (ref 8.7–10.2)
Chloride: 102 mmol/L (ref 96–106)
Creatinine, Ser: 0.78 mg/dL (ref 0.57–1.00)
GFR calc Af Amer: 96 mL/min/{1.73_m2} (ref >59)
GFR calc non Af Amer: 83 mL/min/{1.73_m2} (ref >59)
Glucose: 114 mg/dL — ABNORMAL HIGH (ref 65–99)
Potassium: 4.2 mmol/L (ref 3.5–5.2)
Sodium: 144 mmol/L (ref 134–144)

## 2019-05-20 NOTE — Telephone Encounter (Signed)
Pt reporting sore throat ("on fire"), cough, wheezing  Her cardiac CT appointment will be postponed until testing is completed.   I will send a message to [facility] (ordering provider)  Pt verbalized understanding  Marchia Bond RN Navigator Cardiac Imaging [facility] [phone] Office  [phone] Cell

## 2019-05-20 NOTE — Telephone Encounter (Signed)
Left message on voicemail with name and callback number Sharlyn Ziggy Chanthavong RN Navigator Cardiac Imaging [facility] [phone] Office [phone] Cell  

## 2019-05-20 NOTE — Telephone Encounter (Signed)
Please respond

## 2019-05-21 ENCOUNTER — Ambulatory Visit (HOSPITAL_COMMUNITY): Payer: BC Managed Care – PPO

## 2019-05-22 ENCOUNTER — Telehealth: Payer: Self-pay

## 2019-05-22 ENCOUNTER — Other Ambulatory Visit: Payer: BC Managed Care – PPO

## 2019-05-22 DIAGNOSIS — R6889 Other general symptoms and signs: Secondary | ICD-10-CM | POA: Diagnosis not present

## 2019-05-22 DIAGNOSIS — Z20822 Contact with and (suspected) exposure to covid-19: Secondary | ICD-10-CM

## 2019-05-22 NOTE — Telephone Encounter (Signed)
Dr. Einar Gip request COVID 19 test. Pt. Symptomatic.

## 2019-05-25 ENCOUNTER — Other Ambulatory Visit: Payer: Self-pay | Admitting: Cardiology

## 2019-05-25 DIAGNOSIS — I209 Angina pectoris, unspecified: Secondary | ICD-10-CM

## 2019-05-25 LAB — NOVEL CORONAVIRUS, NAA: SARS-CoV-2, NAA: NOT DETECTED

## 2019-05-28 DIAGNOSIS — F439 Reaction to severe stress, unspecified: Secondary | ICD-10-CM | POA: Diagnosis not present

## 2019-05-28 DIAGNOSIS — F5104 Psychophysiologic insomnia: Secondary | ICD-10-CM | POA: Diagnosis not present

## 2019-05-28 DIAGNOSIS — I10 Essential (primary) hypertension: Secondary | ICD-10-CM | POA: Diagnosis not present

## 2019-05-28 NOTE — Telephone Encounter (Signed)
Does pt need an angiogram as well as echo?

## 2019-06-01 ENCOUNTER — Ambulatory Visit (INDEPENDENT_AMBULATORY_CARE_PROVIDER_SITE_OTHER): Payer: BC Managed Care – PPO

## 2019-06-01 ENCOUNTER — Other Ambulatory Visit: Payer: Self-pay

## 2019-06-01 DIAGNOSIS — I209 Angina pectoris, unspecified: Secondary | ICD-10-CM | POA: Diagnosis not present

## 2019-06-01 DIAGNOSIS — R0609 Other forms of dyspnea: Secondary | ICD-10-CM

## 2019-06-01 DIAGNOSIS — R0600 Dyspnea, unspecified: Secondary | ICD-10-CM

## 2019-06-06 ENCOUNTER — Telehealth (HOSPITAL_COMMUNITY): Payer: Self-pay | Admitting: Emergency Medicine

## 2019-06-06 NOTE — Telephone Encounter (Signed)
mychart message sent

## 2019-06-08 ENCOUNTER — Other Ambulatory Visit: Payer: Self-pay

## 2019-06-08 ENCOUNTER — Ambulatory Visit (HOSPITAL_COMMUNITY): Admission: RE | Admit: 2019-06-08 | Payer: BC Managed Care – PPO | Source: Ambulatory Visit

## 2019-06-08 ENCOUNTER — Encounter (HOSPITAL_COMMUNITY): Payer: Self-pay

## 2019-06-08 ENCOUNTER — Ambulatory Visit (HOSPITAL_COMMUNITY)
Admission: RE | Admit: 2019-06-08 | Discharge: 2019-06-08 | Disposition: A | Discharge status: Home / Self Care | Payer: BC Managed Care – PPO | Source: Ambulatory Visit | Attending: Cardiology | Admitting: Cardiology

## 2019-06-08 DIAGNOSIS — I209 Angina pectoris, unspecified: Secondary | ICD-10-CM | POA: Diagnosis not present

## 2019-06-08 DIAGNOSIS — Z006 Encounter for examination for normal comparison and control in clinical research program: Secondary | ICD-10-CM

## 2019-06-08 MED ORDER — NITROGLYCERIN 0.4 MG SL SUBL
SUBLINGUAL_TABLET | SUBLINGUAL | Status: AC
  Administered 2019-06-08: 0.8 mg
  Filled 2019-06-08: qty 2

## 2019-06-08 MED ORDER — METOPROLOL TARTRATE 5 MG/5ML IV SOLN
INTRAVENOUS | Status: AC
  Administered 2019-06-08: 5 mg
  Filled 2019-06-08: qty 20

## 2019-06-08 MED ORDER — DILTIAZEM HCL 25 MG/5ML IV SOLN
INTRAVENOUS | Status: AC
  Filled 2019-06-08: qty 5

## 2019-06-08 MED ORDER — METOPROLOL TARTRATE 5 MG/5ML IV SOLN
5.0000 mg | Freq: Once | INTRAVENOUS | Status: AC
  Administered 2019-06-08: 5 mg via INTRAVENOUS
  Filled 2019-06-08: qty 5

## 2019-06-08 MED ORDER — NITROGLYCERIN 0.4 MG SL SUBL: 0.8000 mg | SUBLINGUAL_TABLET | Freq: Once | SUBLINGUAL | Status: DC

## 2019-06-08 MED ORDER — IOHEXOL 350 MG/ML SOLN
80.0000 mL | Freq: Once | INTRAVENOUS | Status: AC | PRN
  Administered 2019-06-08: 100 mL via INTRAVENOUS

## 2019-06-08 NOTE — Research (Signed)
Sharon Huffman met inclusion and exclusion criteria.  The informed consent form, study requirements and expectations were reviewed with the subject and questions and concerns were addressed prior to the signing of the consent form.  The subject verbalized understanding of the trial requirements.  The subject agreed to participate in the CADFEM trial and signed the informed consent.  The informed consent was obtained prior to performance of any protocol-specific procedures for the subject.  A copy of the signed informed consent was given to the subject and a copy was placed in the subject's medical record.   Angela M. Smith Research Assistant 

## 2019-06-08 NOTE — Progress Notes (Signed)
Ct complete. Patient reports of headache. Patient offered snack and beverage.

## 2019-06-11 ENCOUNTER — Other Ambulatory Visit: Payer: Self-pay | Admitting: Internal Medicine

## 2019-06-11 DIAGNOSIS — J45991 Cough variant asthma: Secondary | ICD-10-CM

## 2019-06-17 ENCOUNTER — Telehealth: Payer: Self-pay

## 2019-06-24 ENCOUNTER — Ambulatory Visit: Payer: BC Managed Care – PPO | Admitting: Cardiology

## 2019-06-24 ENCOUNTER — Encounter: Payer: Self-pay | Admitting: Cardiology

## 2019-06-24 ENCOUNTER — Ambulatory Visit: Payer: BLUE CROSS/BLUE SHIELD | Admitting: Cardiology

## 2019-06-24 ENCOUNTER — Other Ambulatory Visit: Payer: Self-pay

## 2019-06-24 VITALS — Ht 65.0 in | Wt 181.0 lb

## 2019-06-24 DIAGNOSIS — Z8249 Family history of ischemic heart disease and other diseases of the circulatory system: Secondary | ICD-10-CM

## 2019-06-24 DIAGNOSIS — I10 Essential (primary) hypertension: Secondary | ICD-10-CM

## 2019-06-24 DIAGNOSIS — I25118 Atherosclerotic heart disease of native coronary artery with other forms of angina pectoris: Secondary | ICD-10-CM | POA: Diagnosis not present

## 2019-06-24 DIAGNOSIS — I209 Angina pectoris, unspecified: Secondary | ICD-10-CM

## 2019-06-24 NOTE — Progress Notes (Signed)
Virtual Visit via Video Note: This visit type was conducted due to national recommendations for restrictions regarding the COVID-19 Pandemic (e.g. social distancing).  This format is felt to be most appropriate for this patient at this time.  All issues noted in this document were discussed and addressed.  No physical exam was performed (except for noted visual exam findings with Telehealth visits).  The patient has consented to conduct a Telehealth visit and understands insurance will be billed.   I connected with@, on 06/24/19 at  by a video enabled telemedicine application and verified that I am speaking with the correct person using two identifiers.   I discussed the limitations of evaluation and management by telemedicine and the availability of in person appointments. The patient expressed understanding and agreed to proceed.   I have discussed with patient regarding the safety during COVID Pandemic and steps and precautions to be taken including social distancing, frequent hand wash and use of detergent soap, gels with the patient. I asked the patient to avoid touching mouth, nose, eyes, ears with the hands. I encouraged regular walking around the neighborhood and exercise and regular diet, as long as social distancing can be maintained.  Primary Physician/Referring:  Deland Pretty, MD  Patient ID: Sharon Huffman, female    DOB: Nov 27, 1961, 58 y.o.   MRN: [id number]  Chief Complaint  Patient presents with  . Chest Pain  . Shortness of Breath  . Results  . Follow-up    6wk    HPI: Sharon Huffman  is a 58 y.o. female  with Very strong family history of premature coronary artery disease in her father had CAD at 31 Y and died at 102 Years of age, tobacco use quit in Dec 2019, 15-20 pack year history, hypertension and hyperlipidemia.    She had chest discomfort in the middle of the chest, notices when she is active and doing physical exertional activities but also noted during stressful  activities and is relieved at rest.  Episodes last 15-20 minutes after resting. Underwent coronary CTA. Has not had further chest pain and still has dyspnea and fatigue which is improving.   She also states that she's been under tremendous stress in view of Covid 19.  Past Medical History:  Diagnosis Date  . ADHD   . Bronchitis, chronic ([place])   . Cervical cancer ([place])   . Depression   . Lyme disease   . Migraine   . Ovarian cancer ([place])   . Palpitations     Past Surgical History:  Procedure Laterality Date  . ABDOMINAL HYSTERECTOMY  1994  . ANTERIOR CERVICAL DECOMP/DISCECTOMY FUSION  2000  . BREAST ENHANCEMENT SURGERY Bilateral   . EYE SURGERY  2016   PVD    Social History   Socioeconomic History  . Marital status: Married    Spouse name: Not on file  . Number of children: 0  . Years of education: 60  . Highest education level: Not on file  Occupational History  . Occupation: Chiropractor  Social Needs  . Financial resource strain: Not on file  . Food insecurity    Worry: Not on file    Inability: Not on file  . Transportation needs    Medical: Not on file    Non-medical: Not on file  Tobacco Use  . Smoking status: Former Smoker    Packs/day: 0.50    Years: 30.00    Pack years: 15.00    Types: Cigarettes  Start date: 12/18/1975    Quit date: 11/27/2018    Years since quitting: 0.5  . Smokeless tobacco: Never Used  Substance and Sexual Activity  . Alcohol use: Yes    Alcohol/week: 7.0 standard drinks    Types: 7 Glasses of wine per week    Comment: occas  . Drug use: No  . Sexual activity: Not on file    Comment: Married  Lifestyle  . Physical activity    Days per week: Not on file    Minutes per session: Not on file  . Stress: Not on file  Relationships  . Social Herbalist on phone: Not on file    Gets together: Not on file    Attends religious service: Not on file    Active member of club or organization: Not on file    Attends  meetings of clubs or organizations: Not on file    Relationship status: Not on file  . Intimate partner violence    Fear of current or ex partner: Not on file    Emotionally abused: Not on file    Physically abused: Not on file    Forced sexual activity: Not on file  Other Topics Concern  . Not on file  Social History Narrative   Lives at home w/ her husband and mother w/ dementia   Right-handed   Caffeine: 3 cups coffee per day   Review of Systems  Constitution: Positive for malaise/fatigue (improving). Negative for chills, decreased appetite and weight gain.  Cardiovascular: Positive for palpitations (occasional). Negative for chest pain (No further pain), dyspnea on exertion, leg swelling and syncope.  Respiratory: Positive for shortness of breath (better).   Endocrine: Negative for cold intolerance.  Hematologic/Lymphatic: Does not bruise/bleed easily.  Musculoskeletal: Positive for joint pain. Negative for joint swelling.  Gastrointestinal: Positive for heartburn. Negative for abdominal pain, anorexia, change in bowel habit, hematochezia and melena.  Neurological: Negative for headaches and light-headedness.  Psychiatric/Behavioral: Negative for depression and substance abuse.  All other systems reviewed and are negative.     Objective  Height 5' 5"  (1.651 m), weight 181 lb (82.1 kg). Body mass index is 30.12 kg/m.   Physical exam not performed or limited due to virtual visit.  Patient appeared to be in no distress, Neck was supple, respiration was not labored.  Please see exam details from prior visit is as below.  Physical Exam  Constitutional: She appears well-developed and well-nourished. No distress.  HENT:  Head: Atraumatic.  Eyes: Conjunctivae are normal.  Neck: Neck supple. No JVD present. No thyromegaly present.  Cardiovascular: Normal rate, regular rhythm, normal heart sounds and intact distal pulses. Exam reveals no gallop.  No murmur heard. Pulmonary/Chest:  Effort normal and breath sounds normal.  Abdominal: Soft. Bowel sounds are normal.  Musculoskeletal: Normal range of motion.  Neurological: She is alert.  Skin: Skin is warm and dry.  Psychiatric: She has a normal mood and affect.   Radiology: No results found.  Laboratory examination:   Labs 02/04/2019: HB 13.4/HCT 41.2, platelets 153.  Normal indicis. BUN 18, creatinine 0.7, serum glucose 119 mg.  CMP otherwise normal.  Labs 03/09/2014: Normal CBC, CMP. Total cholesterol 254, triglycerides 208, HDL 51, LDL 161. Non-HDL cholesterol 20 3.  CMP Latest Ref Rng & Units 05/19/2019 09/15/2014  Glucose 65 - 99 mg/dL 114(H) 114(H)  BUN 6 - 24 mg/dL 15 9  Creatinine 0.57 - 1.00 mg/dL 0.78 0.70  Sodium 134 - 144 mmol/L  144 141  Potassium 3.5 - 5.2 mmol/L 4.2 3.6(L)  Chloride 96 - 106 mmol/L 102 97  CO2 20 - 29 mmol/L 27 29  Calcium 8.7 - 10.2 mg/dL 9.5 9.6   CBC Latest Ref Rng & Units 09/15/2014  WBC 4.0 - 10.5 K/uL 7.9  Hemoglobin 12.0 - 15.0 g/dL 13.3  Hematocrit 36.0 - 46.0 % 39.4  Platelets 150 - 400 K/uL 166   PRN Meds:. Medications Discontinued During This Encounter  Medication Reason  . nebivolol (BYSTOLIC) 5 MG tablet Cost of medication   Current Meds  Medication Sig  . ADDERALL XR 25 MG 24 hr capsule 25 mg.   . ALPRAZolam (XANAX) 1 MG tablet Take 1 mg by mouth 3 (three) times daily.  Marland Kitchen atenolol (TENORMIN) 50 MG tablet Take 50 mg by mouth daily.  . Biotin 10 MG CAPS Take by mouth daily.  . Cholecalciferol (VITAMIN D-3) 25 MCG (1000 UT) CAPS Take by mouth daily.  . CRESTOR 10 MG tablet Take 10 mg by mouth daily.   . [employer] ASPIRIN ADULT LOW DOSE 81 MG chewable tablet daily.  . Cyanocobalamin (VITAMIN B-12 CR PO) Take 6,000 mcg by mouth 2 (two) times a day.   . cyclobenzaprine (FLEXERIL) 10 MG tablet Take 1 tablet (10 mg total) by mouth at bedtime. As needed for pain  . famotidine (PEPCID) 20 MG tablet One after supper  . FLUoxetine (PROZAC) 20 MG capsule Take 20 mg by mouth  daily.  . isosorbide mononitrate (IMDUR) 30 MG 24 hr tablet TAKE 1 TABLET BY MOUTH EVERY DAY  . levothyroxine (SYNTHROID) 50 MCG tablet Take 50 mcg by mouth daily before breakfast.  . nystatin (MYCOSTATIN) 100000 UNIT/ML suspension Take 5 mLs by mouth 2 (two) times a day.  . pantoprazole (PROTONIX) 40 MG tablet TAKE 1 TABLET (40 MG TOTAL) BY MOUTH DAILY. TAKE 30-60 MIN BEFORE FIRST MEAL OF THE DAY  . traMADol (ULTRAM) 50 MG tablet Take 1 tablet (50 mg total) by mouth 2 (two) times daily. (Patient taking differently: Take 50 mg by mouth 3 (three) times daily. )  . zolpidem (AMBIEN) 5 MG tablet Take 5 mg by mouth at bedtime as needed for sleep.   . [DISCONTINUED] nebivolol (BYSTOLIC) 5 MG tablet Take 5 mg by mouth daily.    Cardiac Studies:   Coronary CT angiogram 06-12-19: Coronary calcium score 291.  Moderate long plaque in the proximal LAD, 25 to 49% stenosis, CT FFR plus.  Minimal plaque in other vessels.  Mild calcification of the aortic valve.  Treadmill Stress 04/05/2014: Indications: Screening for CAD.  Conclusions: Negative for ischemia. Normal exercise tolerence. Symptoms: Dyspnea. THR met. Arrhythmia: None. Continue primary prevention.  The patient exercised according to the Bruce protocol, Total time recorded 8 Min. 2 sec.achieving a max heart rate of 147 which was 88% of MPHR for age and 9.2 METS of work. Baseline NIBP was 132/82. Peak NIBP was 170/74 MaxSysp was: 180 MaxDiasp was: 82. The baseline ECG showed NSR,Borderline LVH. No ischemia. During exercise there wasno ST-T changes of ischemia.  Assessment   1. Coronary artery disease of native artery of native heart with stable angina pectoris ([place])   2. Angina pectoris ([place])   3. Family history of premature CAD Father MI at age 26Y, died at 56Y   4. Essential hypertension    EKG 0/22/2020: Normal sinus rhythm at rate of 87 bpm, normal axis.  Early repolarization.  No evidence of ischemia, normal EKG.  Recommendations:  Patient with hyperlipidemia, hypertension and 15+ pack year cigarette smoking history, family history of premature CAD presenting with exertional and stress-induced chest discomfort relieved with rest suggestive of angina pectoris.    I have reviewed the results of the recently performed coronary CTA, she clearly has coronary artery disease.  However it is of not hemodynamic significance, her chest pains could have been precipitated by coronary spasm on top of underlying coronary artery disease.  However since she is on Imdur she has not had any further chest pain hence continue the same.  Lipids are being managed by PCP, I do not have recent labs but was told to be normal.  Would aim for LDL goal to be less than or equal to 70, prefer to be in 40-50 range.  Her triglycerides were also elevated previously.  I have discussed this with the patient.  Symptoms of fatigue and dyspnea has also improved, continue present medical therapy, I do not have a blood pressure recordings from today as she did not have batteries and advised her to keep a close eye on her blood pressure as well.  I would like to see her back in 3 months.    Adrian Prows, MD, [facility] 06/24/2019, 1:18 PM [facility]. [place] Pager: [phone] Office: [phone] If no answer Cell [phone]

## 2019-07-08 DIAGNOSIS — R2689 Other abnormalities of gait and mobility: Secondary | ICD-10-CM | POA: Diagnosis not present

## 2019-07-08 DIAGNOSIS — R05 Cough: Secondary | ICD-10-CM | POA: Diagnosis not present

## 2019-07-08 DIAGNOSIS — F339 Major depressive disorder, recurrent, unspecified: Secondary | ICD-10-CM | POA: Diagnosis not present

## 2019-07-17 ENCOUNTER — Other Ambulatory Visit: Payer: Self-pay

## 2019-07-17 DIAGNOSIS — R6889 Other general symptoms and signs: Secondary | ICD-10-CM | POA: Diagnosis not present

## 2019-07-17 DIAGNOSIS — Z20822 Contact with and (suspected) exposure to covid-19: Secondary | ICD-10-CM

## 2019-07-19 LAB — NOVEL CORONAVIRUS, NAA: SARS-CoV-2, NAA: NOT DETECTED

## 2019-07-21 NOTE — Telephone Encounter (Signed)
Please read

## 2019-07-23 NOTE — Telephone Encounter (Signed)
Increase atenolol to 50 mg BID and add Amlodipine 5 mg daily. Set up visit in 2-3 weeks

## 2019-07-24 ENCOUNTER — Other Ambulatory Visit: Payer: Self-pay | Admitting: Cardiology

## 2019-07-24 DIAGNOSIS — I10 Essential (primary) hypertension: Secondary | ICD-10-CM

## 2019-07-24 MED ORDER — AMLODIPINE BESYLATE 5 MG PO TABS: 5.0000 mg | ORAL_TABLET | Freq: Every day | ORAL | 2 refills | Status: DC

## 2019-07-24 NOTE — Telephone Encounter (Signed)
Did you not send the Rx for amlodipine as I suggested

## 2019-07-24 NOTE — Telephone Encounter (Signed)
Please read

## 2019-07-28 DIAGNOSIS — Z0000 Encounter for general adult medical examination without abnormal findings: Secondary | ICD-10-CM | POA: Diagnosis not present

## 2019-07-28 DIAGNOSIS — Z1159 Encounter for screening for other viral diseases: Secondary | ICD-10-CM | POA: Diagnosis not present

## 2019-07-30 DIAGNOSIS — Z0000 Encounter for general adult medical examination without abnormal findings: Secondary | ICD-10-CM | POA: Diagnosis not present

## 2019-08-06 ENCOUNTER — Ambulatory Visit (INDEPENDENT_AMBULATORY_CARE_PROVIDER_SITE_OTHER): Payer: BC Managed Care – PPO | Admitting: Cardiology

## 2019-08-06 ENCOUNTER — Encounter: Payer: Self-pay | Admitting: Cardiology

## 2019-08-06 ENCOUNTER — Other Ambulatory Visit: Payer: Self-pay

## 2019-08-06 VITALS — BP 119/65 | HR 83 | Ht 65.0 in | Wt 188.4 lb

## 2019-08-06 DIAGNOSIS — I10 Essential (primary) hypertension: Secondary | ICD-10-CM

## 2019-08-06 DIAGNOSIS — R600 Localized edema: Secondary | ICD-10-CM

## 2019-08-06 DIAGNOSIS — E782 Mixed hyperlipidemia: Secondary | ICD-10-CM

## 2019-08-06 DIAGNOSIS — R0609 Other forms of dyspnea: Secondary | ICD-10-CM

## 2019-08-06 DIAGNOSIS — R0600 Dyspnea, unspecified: Secondary | ICD-10-CM

## 2019-08-06 MED ORDER — LOSARTAN POTASSIUM-HCTZ 50-12.5 MG PO TABS: 1.0000 | ORAL_TABLET | ORAL | 2 refills | Status: DC

## 2019-08-06 MED ORDER — FUROSEMIDE 20 MG PO TABS: 20.0000 mg | ORAL_TABLET | Freq: Every day | ORAL | 0 refills | Status: DC | PRN

## 2019-08-06 NOTE — Progress Notes (Signed)
Primary Physician/Referring:  Deland Pretty, MD  Patient ID: Sharon Huffman, female    DOB: 29-Dec-1960, 58 y.o.   MRN: [id number]  Chief Complaint  Patient presents with  . Hypertension  . Follow-up    2-3 week    HPI: Sharon Huffman  is a 58 y.o. female  With  family history of premature coronary artery disease in her father had CAD at 63 Y and died at 54 Years of age, tobacco use quit in Dec 2019, 15-20 pack year history, hypertension and hyperlipidemia, mild coronary artery disease by coronary CTA on 06/08/2019 with mid LAD 50% stenosis presents here for follow-up of hypertension.    Fortunately remained stable with regard to angina pectoris since being on isosorbide mononitrate.  On her last office visit she was started on amlodipine. She has developed leg edema and bloating and weight gain since. BP now is controlled.  Past Medical History:  Diagnosis Date  . ADHD   . Bronchitis, chronic ([place])   . Cervical cancer ([place])   . Depression   . Lyme disease   . Migraine   . Ovarian cancer ([place])   . Palpitations     Past Surgical History:  Procedure Laterality Date  . ABDOMINAL HYSTERECTOMY  1994  . ANTERIOR CERVICAL DECOMP/DISCECTOMY FUSION  2000  . BREAST ENHANCEMENT SURGERY Bilateral   . EYE SURGERY  2016   PVD    Social History   Socioeconomic History  . Marital status: Married    Spouse name: Not on file  . Number of children: 0  . Years of education: 51  . Highest education level: Not on file  Occupational History  . Occupation: Chiropractor  Social Needs  . Financial resource strain: Not on file  . Food insecurity    Worry: Not on file    Inability: Not on file  . Transportation needs    Medical: Not on file    Non-medical: Not on file  Tobacco Use  . Smoking status: Former Smoker    Packs/day: 0.50    Years: 30.00    Pack years: 15.00    Types: Cigarettes    Start date: 12/18/1975    Quit date: 11/27/2018    Years since quitting: 0.6  . Smokeless  tobacco: Never Used  Substance and Sexual Activity  . Alcohol use: Yes    Alcohol/week: 7.0 standard drinks    Types: 7 Glasses of wine per week    Comment: occas  . Drug use: No  . Sexual activity: Not on file    Comment: Married  Lifestyle  . Physical activity    Days per week: Not on file    Minutes per session: Not on file  . Stress: Not on file  Relationships  . Social Herbalist on phone: Not on file    Gets together: Not on file    Attends religious service: Not on file    Active member of club or organization: Not on file    Attends meetings of clubs or organizations: Not on file    Relationship status: Not on file  . Intimate partner violence    Fear of current or ex partner: Not on file    Emotionally abused: Not on file    Physically abused: Not on file    Forced sexual activity: Not on file  Other Topics Concern  . Not on file  Social History Narrative   Lives at home  w/ her husband and mother w/ dementia   Right-handed   Caffeine: 3 cups coffee per day   Review of Systems  Constitution: Positive for malaise/fatigue (improving). Negative for chills, decreased appetite and weight gain.  Cardiovascular: Positive for leg swelling and palpitations (occasional). Negative for dyspnea on exertion and syncope.  Respiratory: Positive for shortness of breath (better).   Endocrine: Negative for cold intolerance.  Hematologic/Lymphatic: Does not bruise/bleed easily.  Musculoskeletal: Positive for joint pain. Negative for joint swelling.  Gastrointestinal: Positive for heartburn. Negative for abdominal pain, anorexia, change in bowel habit, hematochezia and melena.  Neurological: Negative for headaches and light-headedness.  Psychiatric/Behavioral: Negative for depression and substance abuse.  All other systems reviewed and are negative.     Objective  Blood pressure 119/65, pulse 83, height _0  (1.651 m), weight 188 lb 6.4 oz (85.5 kg), SpO2 96 %. Body mass  index is 31.35 kg/m.   Physical exam not performed or limited due to virtual visit.  Patient appeared to be in no distress, Neck was supple, respiration was not labored.  Please see exam details from prior visit is as below.  Physical Exam  Constitutional: She appears well-developed and well-nourished. No distress.  HENT:  Head: Atraumatic.  Eyes: Conjunctivae are normal.  Neck: Neck supple. No JVD present. No thyromegaly present.  Cardiovascular: Normal rate, regular rhythm, normal heart sounds and intact distal pulses. Exam reveals no gallop.  No murmur heard. 2+ bilateral leg edema  Pulmonary/Chest: Effort normal and breath sounds normal.  Abdominal: Soft. Bowel sounds are normal.  Musculoskeletal: Normal range of motion.  Neurological: She is alert.  Skin: Skin is warm and dry.  Psychiatric: She has a normal mood and affect.   Radiology: No results found.  Laboratory examination:   Labs 07/28/2019: Total 154, triglycerides 227, HDL 39, LDL 70.  Non-HDL cholesterol 115.  Serum glucose 15 mg, BUN 13, creatinine 0.8, eGFR 75 mL, potassium 3.9.  CMP otherwise normal.  CBC normal.  Labs 02/04/2019: HB 13.4/HCT 41.2, platelets 153.  Normal indicis. BUN 18, creatinine 0.7, serum glucose 119 mg.  CMP otherwise normal.  Labs 03/09/2014: Normal CBC, CMP. Total cholesterol 254, triglycerides 208, HDL 51, LDL 161. Non-HDL cholesterol 20 3.  CMP Latest Ref Rng & Units 05/19/2019 09/15/2014  Glucose 65 - 99 mg/dL 114(H) 114(H)  BUN 6 - 24 mg/dL 15 9  Creatinine 0.57 - 1.00 mg/dL 0.78 0.70  Sodium 134 - 144 mmol/L 144 141  Potassium 3.5 - 5.2 mmol/L 4.2 3.6(L)  Chloride 96 - 106 mmol/L 102 97  CO2 20 - 29 mmol/L 27 29  Calcium 8.7 - 10.2 mg/dL 9.5 9.6   CBC Latest Ref Rng & Units 09/15/2014  WBC 4.0 - 10.5 K/uL 7.9  Hemoglobin 12.0 - 15.0 g/dL 13.3  Hematocrit 36.0 - 46.0 % 39.4  Platelets 150 - 400 K/uL 166   PRN Meds:. Medications Discontinued During This Encounter  Medication  Reason  . amLODipine (NORVASC) 5 MG tablet Discontinued by provider   Current Meds  Medication Sig  . ADDERALL XR 25 MG 24 hr capsule 25 mg.   . ALPRAZolam (XANAX) 1 MG tablet Take 1 mg by mouth 3 (three) times daily.  Marland Kitchen atenolol (TENORMIN) 50 MG tablet Take 50 mg by mouth daily.  . Biotin 10 MG CAPS Take by mouth daily.  . Cholecalciferol (VITAMIN D-3) 25 MCG (1000 UT) CAPS Take by mouth daily.  . CRESTOR 10 MG tablet Take 10 mg by mouth daily.   Marland Kitchen  [employer] ASPIRIN ADULT LOW DOSE 81 MG chewable tablet daily.  . Cyanocobalamin (VITAMIN B-12 CR PO) Take 6,000 mcg by mouth 2 (two) times a day.   . cyclobenzaprine (FLEXERIL) 10 MG tablet Take 1 tablet (10 mg total) by mouth at bedtime. As needed for pain  . famotidine (PEPCID) 20 MG tablet One after supper  . FLUoxetine (PROZAC) 20 MG capsule Take 20 mg by mouth daily.  . isosorbide mononitrate (IMDUR) 30 MG 24 hr tablet TAKE 1 TABLET BY MOUTH EVERY DAY  . levothyroxine (SYNTHROID) 50 MCG tablet Take 50 mcg by mouth daily before breakfast.  . pantoprazole (PROTONIX) 40 MG tablet TAKE 1 TABLET (40 MG TOTAL) BY MOUTH DAILY. TAKE 30-60 MIN BEFORE FIRST MEAL OF THE DAY  . traMADol (ULTRAM) 50 MG tablet Take 1 tablet (50 mg total) by mouth 2 (two) times daily. (Patient taking differently: Take 50 mg by mouth 3 (three) times daily. )  . zolpidem (AMBIEN) 5 MG tablet Take 5 mg by mouth at bedtime as needed for sleep.   . [DISCONTINUED] amLODipine (NORVASC) 5 MG tablet Take 1 tablet (5 mg total) by mouth daily.    Cardiac Studies:   Coronary CT angiogram 06/13/2019: Coronary calcium score 291.  Moderate long plaque in the proximal LAD, 25 to 49% stenosis, CT FFR plus.  Minimal plaque in other vessels.  Mild calcification of the aortic valve.  Treadmill Stress 04/05/2014: Indications: Screening for CAD.  Conclusions: Negative for ischemia. Normal exercise tolerence. Symptoms: Dyspnea. THR met. Arrhythmia: None. Continue primary prevention.  The patient  exercised according to the Bruce protocol, Total time recorded 8 Min. 2 sec.achieving a max heart rate of 147 which was 88% of MPHR for age and 9.2 METS of work. Baseline NIBP was 132/82. Peak NIBP was 170/74 MaxSysp was: 180 MaxDiasp was: 82. The baseline ECG showed NSR,Borderline LVH. No ischemia. During exercise there wasno ST-T changes of ischemia.  Assessment   1. Essential hypertension   2. DOE (dyspnea on exertion)   3. Bilateral leg edema   4. Mixed hypercholesterolemia and hypertriglyceridemia    EKG 0/22/2020: Normal sinus rhythm at rate of 87 bpm, normal axis.  Early repolarization.  No evidence of ischemia, normal EKG.  Recommendations:   Patient with family history of premature coronary artery disease in her father had CAD at 47 Y and died at 68 Years of age, tobacco use quit in Dec 2019, 15-20 pack year history, hypertension and hyperlipidemia, mild coronary artery disease by coronary CTA on 06-13-2019 with mid LAD 50% stenosis presents here for follow-up of hypertension.    Patient called [place] about elevated blood pressure, I started her on amlodipine, today she presents for follow-up.  She has developed significant leg edema, related to amlodipine.  I discontinued this, we will switch her to losartan HCT 50/12.5 mg every morning.  I'll also give her furosemide to use on a p.r.n. basis 30 pills with no refills.  I have reviewed her mixed hyperlipidemia, lipid profile testing that was recently performed.  I discussed with her that she would be at extreme high risk for development of coronary disease and we discussed regarding trying Lovaza, patient would like to try over-the-counter fish oil capsules for a couple weeks and if she tolerates this without significant worsening GERD symptoms, she will call me back so I can place her on the medication. She will reduce Carbohydrate intake.  I have ordered BMP to be done in 2-3 weeks for follow-up.  I will  see her back in 6 months or sooner  if problems.   Adrian Prows, MD, [facility] 08/06/2019, 3:31 PM [facility]. [place] Pager: [phone] Office: [phone] If no answer Cell [phone]

## 2019-08-07 ENCOUNTER — Other Ambulatory Visit: Payer: Self-pay

## 2019-08-07 DIAGNOSIS — Z72820 Sleep deprivation: Secondary | ICD-10-CM | POA: Diagnosis not present

## 2019-08-07 DIAGNOSIS — R471 Dysarthria and anarthria: Secondary | ICD-10-CM | POA: Diagnosis not present

## 2019-08-07 DIAGNOSIS — I209 Angina pectoris, unspecified: Secondary | ICD-10-CM

## 2019-08-07 MED ORDER — ISOSORBIDE MONONITRATE ER 30 MG PO TB24: 30.0000 mg | ORAL_TABLET | Freq: Every day | ORAL | 0 refills | Status: DC

## 2019-08-18 DIAGNOSIS — R471 Dysarthria and anarthria: Secondary | ICD-10-CM | POA: Diagnosis not present

## 2019-08-20 DIAGNOSIS — I10 Essential (primary) hypertension: Secondary | ICD-10-CM | POA: Diagnosis not present

## 2019-08-20 DIAGNOSIS — F339 Major depressive disorder, recurrent, unspecified: Secondary | ICD-10-CM | POA: Diagnosis not present

## 2019-08-20 DIAGNOSIS — R2689 Other abnormalities of gait and mobility: Secondary | ICD-10-CM | POA: Diagnosis not present

## 2019-08-20 DIAGNOSIS — E785 Hyperlipidemia, unspecified: Secondary | ICD-10-CM | POA: Diagnosis not present

## 2019-08-29 ENCOUNTER — Other Ambulatory Visit: Payer: Self-pay | Admitting: Cardiology

## 2019-08-29 DIAGNOSIS — R600 Localized edema: Secondary | ICD-10-CM

## 2019-08-30 DIAGNOSIS — G4733 Obstructive sleep apnea (adult) (pediatric): Secondary | ICD-10-CM | POA: Diagnosis not present

## 2019-09-03 ENCOUNTER — Other Ambulatory Visit: Payer: Self-pay | Admitting: Internal Medicine

## 2019-09-03 DIAGNOSIS — J45991 Cough variant asthma: Secondary | ICD-10-CM

## 2019-09-22 DIAGNOSIS — I10 Essential (primary) hypertension: Secondary | ICD-10-CM | POA: Diagnosis not present

## 2019-09-22 DIAGNOSIS — M5441 Lumbago with sciatica, right side: Secondary | ICD-10-CM | POA: Diagnosis not present

## 2019-09-22 DIAGNOSIS — F488 Other specified nonpsychotic mental disorders: Secondary | ICD-10-CM | POA: Diagnosis not present

## 2019-09-24 ENCOUNTER — Ambulatory Visit: Payer: BC Managed Care – PPO | Admitting: Cardiology

## 2019-09-25 DIAGNOSIS — E039 Hypothyroidism, unspecified: Secondary | ICD-10-CM | POA: Diagnosis not present

## 2019-09-25 DIAGNOSIS — I10 Essential (primary) hypertension: Secondary | ICD-10-CM | POA: Diagnosis not present

## 2019-09-25 DIAGNOSIS — Z79899 Other long term (current) drug therapy: Secondary | ICD-10-CM | POA: Diagnosis not present

## 2019-09-25 DIAGNOSIS — E785 Hyperlipidemia, unspecified: Secondary | ICD-10-CM | POA: Diagnosis not present

## 2019-10-01 DIAGNOSIS — E039 Hypothyroidism, unspecified: Secondary | ICD-10-CM | POA: Diagnosis not present

## 2019-10-01 DIAGNOSIS — Z23 Encounter for immunization: Secondary | ICD-10-CM | POA: Diagnosis not present

## 2019-10-01 DIAGNOSIS — E876 Hypokalemia: Secondary | ICD-10-CM | POA: Diagnosis not present

## 2019-10-08 NOTE — Telephone Encounter (Signed)
From patient.

## 2019-10-21 ENCOUNTER — Other Ambulatory Visit: Payer: Self-pay | Admitting: Cardiology

## 2019-10-21 DIAGNOSIS — I10 Essential (primary) hypertension: Secondary | ICD-10-CM

## 2019-10-22 DIAGNOSIS — R413 Other amnesia: Secondary | ICD-10-CM | POA: Diagnosis not present

## 2019-10-22 DIAGNOSIS — F439 Reaction to severe stress, unspecified: Secondary | ICD-10-CM | POA: Diagnosis not present

## 2019-10-22 DIAGNOSIS — I10 Essential (primary) hypertension: Secondary | ICD-10-CM | POA: Diagnosis not present

## 2019-10-22 DIAGNOSIS — E785 Hyperlipidemia, unspecified: Secondary | ICD-10-CM | POA: Diagnosis not present

## 2019-10-29 ENCOUNTER — Other Ambulatory Visit: Payer: Self-pay | Admitting: Cardiology

## 2019-10-29 DIAGNOSIS — I10 Essential (primary) hypertension: Secondary | ICD-10-CM

## 2019-11-03 DIAGNOSIS — E876 Hypokalemia: Secondary | ICD-10-CM | POA: Diagnosis not present

## 2019-11-06 ENCOUNTER — Encounter: Payer: Self-pay | Admitting: Cardiology

## 2019-11-06 ENCOUNTER — Other Ambulatory Visit: Payer: Self-pay

## 2019-11-06 ENCOUNTER — Ambulatory Visit: Payer: BC Managed Care – PPO | Admitting: Cardiology

## 2019-11-06 VITALS — BP 107/70 | HR 87 | Temp 98.0°F | Ht 65.0 in | Wt 167.0 lb

## 2019-11-06 DIAGNOSIS — R0600 Dyspnea, unspecified: Secondary | ICD-10-CM | POA: Diagnosis not present

## 2019-11-06 DIAGNOSIS — I10 Essential (primary) hypertension: Secondary | ICD-10-CM

## 2019-11-06 DIAGNOSIS — R0609 Other forms of dyspnea: Secondary | ICD-10-CM

## 2019-11-06 MED ORDER — SPIRONOLACTONE 50 MG PO TABS: 50.0000 mg | ORAL_TABLET | Freq: Every day | ORAL | 2 refills | Status: DC

## 2019-11-06 NOTE — Progress Notes (Signed)
Primary Physician/Referring:  Deland Pretty, MD  Patient ID: Sharon Huffman, female    DOB: 07-22-1961, 58 y.o.   MRN: [id number]  Chief Complaint  Patient presents with  . Hypertension  . Hyperlipidemia  . Follow-up    3 month    HPI: Sharon Huffman  is a 58 y.o. female  With  family history of premature coronary artery disease in her father had CAD at 10 Y and died at 46 Years of age, tobacco use quit in Dec 2019, 15-20 pack year history, hypertension and hyperlipidemia, mild coronary artery disease by coronary CTA on 06/08/2019 with mid LAD 50% stenosis presents here for follow-up of hypertension.    Fortunately remained stable with regard to angina pectoris since being on isosorbide mononitrate. With amlodipine she had developed significant leg edema and had gained about 20 pounds in weight, I had started her on furosemide to be taken on a p.r.n. basis and I had switched her to losartan HCT for hypertension.  She lost 20 pounds in weight overall, started to feel better but since being on losartan HCT states that she's been having frequent diarrhea.  Otherwise she is doing well and has to take potassium supplements due to diarrhea, has to go to the bathroom every time she eats.  No abdominal discomfort, no fever or chills.    Past Medical History:  Diagnosis Date  . ADHD   . Anxiety   . Bronchitis, chronic ([place])   . Cervical cancer ([place])   . Depression   . Depression   . Hyperlipidemia   . Hypertension   . Insomnia disorder   . Lyme disease   . Migraine   . Ovarian cancer ([place])   . Palpitations     Past Surgical History:  Procedure Laterality Date  . ABDOMINAL HYSTERECTOMY  1994  . ANTERIOR CERVICAL DECOMP/DISCECTOMY FUSION  2000  . BREAST ENHANCEMENT SURGERY Bilateral   . EYE SURGERY  2016   PVD    Social History   Socioeconomic History  . Marital status: Married    Spouse name: Not on file  . Number of children: 0  . Years of education: 64  . Highest education  level: Not on file  Occupational History  . Occupation: Chiropractor  Social Needs  . Financial resource strain: Not on file  . Food insecurity    Worry: Not on file    Inability: Not on file  . Transportation needs    Medical: Not on file    Non-medical: Not on file  Tobacco Use  . Smoking status: Former Smoker    Packs/day: 0.50    Years: 30.00    Pack years: 15.00    Types: Cigarettes    Start date: 12/18/1975    Quit date: 11/27/2018    Years since quitting: 0.9  . Smokeless tobacco: Never Used  Substance and Sexual Activity  . Alcohol use: Yes    Alcohol/week: 7.0 standard drinks    Types: 7 Glasses of wine per week    Comment: occas  . Drug use: No  . Sexual activity: Not on file    Comment: Married  Lifestyle  . Physical activity    Days per week: Not on file    Minutes per session: Not on file  . Stress: Not on file  Relationships  . Social Herbalist on phone: Not on file    Gets together: Not on file    Attends  religious service: Not on file    Active member of club or organization: Not on file    Attends meetings of clubs or organizations: Not on file    Relationship status: Not on file  . Intimate partner violence    Fear of current or ex partner: Not on file    Emotionally abused: Not on file    Physically abused: Not on file    Forced sexual activity: Not on file  Other Topics Concern  . Not on file  Social History Narrative   Lives at home w/ her husband and mother w/ dementia   Right-handed   Caffeine: 3 cups coffee per day   Review of Systems  Constitution: Negative for chills, decreased appetite, malaise/fatigue and weight gain.  Cardiovascular: Positive for palpitations (occasional). Negative for dyspnea on exertion and syncope.  Respiratory: Positive for shortness of breath (better).   Endocrine: Negative for cold intolerance.  Hematologic/Lymphatic: Does not bruise/bleed easily.  Musculoskeletal: Positive for joint pain.  Negative for joint swelling.  Gastrointestinal: Positive for diarrhea and heartburn. Negative for abdominal pain, anorexia, change in bowel habit, hematochezia and melena.  Neurological: Negative for headaches and light-headedness.  Psychiatric/Behavioral: Negative for depression and substance abuse.  All other systems reviewed and are negative.  Objective  Blood pressure 107/70, pulse 87, temperature 98 F (36.7 C), height 5' 5"  (1.651 m), weight 167 lb (75.8 kg), SpO2 100 %. Body mass index is 27.79 kg/m.    Physical Exam  Constitutional: She appears well-developed and well-nourished. No distress.  HENT:  Head: Atraumatic.  Eyes: Conjunctivae are normal.  Neck: Neck supple. No JVD present. No thyromegaly present.  Cardiovascular: Normal rate, regular rhythm, normal heart sounds and intact distal pulses. Exam reveals no gallop.  No murmur heard. Bilateral leg edema  Pulmonary/Chest: Effort normal and breath sounds normal.  Abdominal: Soft. Bowel sounds are normal.  Musculoskeletal: Normal range of motion.  Neurological: She is alert.  Skin: Skin is warm and dry.  Psychiatric: She has a normal mood and affect.   Radiology: No results found.  Laboratory examination:   Labs 07/28/2019: Total 154, triglycerides 227, HDL 39, LDL 70.  Non-HDL cholesterol 115.  Serum glucose 15 mg, BUN 13, creatinine 0.8, eGFR 75 mL, potassium 3.9. CMP otherwise normal.  CBC normal.  Labs 02/04/2019: HB 13.4/HCT 41.2, platelets 153.  Normal indicis. BUN 18, creatinine 0.7, serum glucose 119 mg.  CMP otherwise normal.  Labs 03/09/2014: Normal CBC, CMP. Total cholesterol 254, triglycerides 208, HDL 51, LDL 161. Non-HDL cholesterol 20 3.  CMP Latest Ref Rng & Units 05/19/2019 09/15/2014  Glucose 65 - 99 mg/dL 114(H) 114(H)  BUN 6 - 24 mg/dL 15 9  Creatinine 0.57 - 1.00 mg/dL 0.78 0.70  Sodium 134 - 144 mmol/L 144 141  Potassium 3.5 - 5.2 mmol/L 4.2 3.6(L)  Chloride 96 - 106 mmol/L 102 97  CO2 20 - 29  mmol/L 27 29  Calcium 8.7 - 10.2 mg/dL 9.5 9.6   CBC Latest Ref Rng & Units 09/15/2014  WBC 4.0 - 10.5 K/uL 7.9  Hemoglobin 12.0 - 15.0 g/dL 13.3  Hematocrit 36.0 - 46.0 % 39.4  Platelets 150 - 400 K/uL 166   PRN Meds:. There are no discontinued medications. Current Meds  Medication Sig  . ADDERALL XR 25 MG 24 hr capsule 25 mg.   . ALPRAZolam (XANAX) 1 MG tablet Take 1 mg by mouth 3 (three) times daily.  Marland Kitchen atenolol (TENORMIN) 50 MG tablet Take 50 mg  by mouth daily.  . Biotin 10 MG CAPS Take by mouth daily.  . Cholecalciferol (VITAMIN D-3) 25 MCG (1000 UT) CAPS Take by mouth daily.  . CRESTOR 10 MG tablet Take 10 mg by mouth daily.   . [employer] ASPIRIN ADULT LOW DOSE 81 MG chewable tablet daily.  . Cyanocobalamin (VITAMIN B-12 CR PO) Take 6,000 mcg by mouth 2 (two) times a day.   . cyclobenzaprine (FLEXERIL) 10 MG tablet Take 1 tablet (10 mg total) by mouth at bedtime. As needed for pain  . famotidine (PEPCID) 20 MG tablet One after supper  . FLUoxetine (PROZAC) 20 MG capsule Take 40 mg by mouth daily.   . furosemide (LASIX) 20 MG tablet TAKE 1 TABLET BY MOUTH EVERY DAY AS NEEDED  . isosorbide mononitrate (IMDUR) 30 MG 24 hr tablet Take 1 tablet (30 mg total) by mouth daily.  Marland Kitchen KLOR-CON M20 20 MEQ tablet Take 20 mEq by mouth 2 (two) times daily.   Marland Kitchen levothyroxine (SYNTHROID) 50 MCG tablet Take 50 mcg by mouth daily before breakfast.  . losartan-hydrochlorothiazide (HYZAAR) 50-12.5 MG tablet TAKE 1 TABLET BY MOUTH EVERY DAY IN THE MORNING  . nystatin (MYCOSTATIN) 100000 UNIT/ML suspension Take 5 mLs by mouth 2 (two) times a day.  . pantoprazole (PROTONIX) 40 MG tablet TAKE 1 TABLET (40 MG TOTAL) BY MOUTH DAILY. TAKE 30-60 MIN BEFORE FIRST MEAL OF THE DAY  . traMADol (ULTRAM) 50 MG tablet Take 1 tablet (50 mg total) by mouth 2 (two) times daily. (Patient taking differently: Take 50 mg by mouth 3 (three) times daily. )  . zolpidem (AMBIEN) 5 MG tablet Take 5 mg by mouth at bedtime as needed for  sleep.     Cardiac Studies:   Coronary CT angiogram 06-29-19: Coronary calcium score 291.  Moderate long plaque in the proximal LAD, 25 to 49% stenosis, CT FFR plus.  Minimal plaque in other vessels.  Mild calcification of the aortic valve.  Treadmill Stress 04/05/2014: Indications: Screening for CAD.  Conclusions: Negative for ischemia. Normal exercise tolerence. Symptoms: Dyspnea. THR met. Arrhythmia: None. Continue primary prevention.  The patient exercised according to the Bruce protocol, Total time recorded 8 Min. 2 sec.achieving a max heart rate of 147 which was 88% of MPHR for age and 9.2 METS of work. Baseline NIBP was 132/82. Peak NIBP was 170/74 MaxSysp was: 180 MaxDiasp was: 82. The baseline ECG showed NSR,Borderline LVH. No ischemia. During exercise there wasno ST-T changes of ischemia.  Assessment   1. Essential hypertension   2. DOE (dyspnea on exertion)    EKG 11/06/2019: Normal sinus rhythm at rate of 78 bpm, borderline left atrial abnormality, otherwise normal EKG.   Recommendations:    Sharon Huffman  is a 58 y.o.  female  With  family history of premature coronary artery disease in her father had CAD at 56 Y and died at 83 Years of age, tobacco use quit in Dec 2019, 15-20 pack year history, hypertension and hyperlipidemia, mild coronary artery disease by coronary CTA on 06-29-19 with mid LAD 50% stenosis presents here for follow-up of hypertension.    Patient was seen by me on 08/06/2019, due to significant leg edema due to amlodipine, I discontinued this and started her on losartan HCT and also give her furosemide to be used only on a p.r.n. basis.  Since then she has lost 4020 pounds in weight, edema has improved, but she has started to have diarrhea.  She thinks it may be related  to losartan HCT.  She still taking potassium supplements due to low potassium.   I will discontinue losartan HCT as she is and frequent diarrhea, we'll switch her to spironolactone 25 mg daily.   Hopefully this will also help with blood pressure control.  Adrian Prows, MD, [facility] 11/06/2019, 3:23 PM [facility]. [place] Pager: [phone] Office: [phone] If no answer Cell [phone]

## 2019-11-06 NOTE — Patient Instructions (Signed)
He will start taking potassium supplements if your diarrhea stops.  I have discontinued losartan and switched to to spironolactone 50 mg every day.  We will obtain BMP which is a blood test in 2 weeks.  I will see him back in 6 weeks.

## 2019-11-27 DIAGNOSIS — R197 Diarrhea, unspecified: Secondary | ICD-10-CM | POA: Diagnosis not present

## 2019-11-27 DIAGNOSIS — I10 Essential (primary) hypertension: Secondary | ICD-10-CM | POA: Diagnosis not present

## 2019-11-27 DIAGNOSIS — E876 Hypokalemia: Secondary | ICD-10-CM | POA: Diagnosis not present

## 2019-11-30 DIAGNOSIS — R197 Diarrhea, unspecified: Secondary | ICD-10-CM | POA: Diagnosis not present

## 2019-11-30 DIAGNOSIS — Z0000 Encounter for general adult medical examination without abnormal findings: Secondary | ICD-10-CM | POA: Diagnosis not present

## 2019-11-30 DIAGNOSIS — I10 Essential (primary) hypertension: Secondary | ICD-10-CM | POA: Diagnosis not present

## 2019-11-30 DIAGNOSIS — N390 Urinary tract infection, site not specified: Secondary | ICD-10-CM | POA: Diagnosis not present

## 2019-12-01 ENCOUNTER — Other Ambulatory Visit: Payer: Self-pay | Admitting: Cardiology

## 2019-12-01 ENCOUNTER — Other Ambulatory Visit: Payer: Self-pay | Admitting: Internal Medicine

## 2019-12-01 DIAGNOSIS — R600 Localized edema: Secondary | ICD-10-CM

## 2019-12-01 DIAGNOSIS — J45991 Cough variant asthma: Secondary | ICD-10-CM

## 2019-12-03 DIAGNOSIS — K580 Irritable bowel syndrome with diarrhea: Secondary | ICD-10-CM | POA: Diagnosis not present

## 2019-12-03 DIAGNOSIS — R197 Diarrhea, unspecified: Secondary | ICD-10-CM | POA: Diagnosis not present

## 2019-12-03 DIAGNOSIS — I10 Essential (primary) hypertension: Secondary | ICD-10-CM | POA: Diagnosis not present

## 2019-12-03 DIAGNOSIS — N3000 Acute cystitis without hematuria: Secondary | ICD-10-CM | POA: Diagnosis not present

## 2019-12-22 DIAGNOSIS — K219 Gastro-esophageal reflux disease without esophagitis: Secondary | ICD-10-CM | POA: Diagnosis not present

## 2019-12-22 DIAGNOSIS — Z1211 Encounter for screening for malignant neoplasm of colon: Secondary | ICD-10-CM | POA: Diagnosis not present

## 2019-12-22 DIAGNOSIS — R634 Abnormal weight loss: Secondary | ICD-10-CM | POA: Diagnosis not present

## 2019-12-22 DIAGNOSIS — R194 Change in bowel habit: Secondary | ICD-10-CM | POA: Diagnosis not present

## 2019-12-24 ENCOUNTER — Encounter: Payer: Self-pay | Admitting: Cardiology

## 2019-12-24 ENCOUNTER — Telehealth: Payer: BC Managed Care – PPO | Admitting: Cardiology

## 2019-12-24 ENCOUNTER — Other Ambulatory Visit: Payer: Self-pay

## 2019-12-24 VITALS — BP 124/77 | Ht 65.0 in | Wt 162.0 lb

## 2019-12-24 DIAGNOSIS — R002 Palpitations: Secondary | ICD-10-CM

## 2019-12-24 DIAGNOSIS — K529 Noninfective gastroenteritis and colitis, unspecified: Secondary | ICD-10-CM

## 2019-12-24 DIAGNOSIS — I10 Essential (primary) hypertension: Secondary | ICD-10-CM

## 2019-12-24 MED ORDER — HYDROCHLOROTHIAZIDE 12.5 MG PO TABS: 12.5000 mg | ORAL_TABLET | Freq: Every day | ORAL | 2 refills | Status: DC

## 2019-12-24 NOTE — Progress Notes (Signed)
Primary Physician/Referring:  Deland Pretty, MD  Patient ID: Sharon Huffman, female    DOB: February 11, 1961, 59 y.o.   MRN: [id number]  Chief Complaint  Patient presents with  . Hypertension    HPI: Sharon Huffman  is a 59 y.o. female  With  family history of premature coronary artery disease in her father had CAD at 8 Y and died at 45 Years of age, tobacco use quit in Dec 2019, 15-20 pack year history, hypertension and hyperlipidemia, mild coronary artery disease by coronary CTA on 06/08/2019 with mid LAD 50% stenosis presents here for follow-up of hypertension.    Fortunately remained stable with regard to angina pectoris since being on isosorbide mononitrate.  She had been taking furosemide on a regular basis and also due to chronic diarrhea, she was taking potassium supplements, I had started her on spironolactone both for hypertension and for hypokalemia.  She now presents for a 6-week visit, states that blood pressure is now very well controlled.  She had to stop all her potassium supplements now and recent labs including BMP by her PCP was normal.  She continues to have diarrhea and no obvious GI etiology has been found.  States that she has continued to lose weight due to diarrhea.  Past Medical History:  Diagnosis Date  . ADHD   . Anxiety   . Bronchitis, chronic ([place])   . Cervical cancer ([place])   . Depression   . Depression   . Hyperlipidemia   . Hypertension   . Insomnia disorder   . Lyme disease   . Migraine   . Ovarian cancer ([place])   . Palpitations     Past Surgical History:  Procedure Laterality Date  . ABDOMINAL HYSTERECTOMY  1994  . ANTERIOR CERVICAL DECOMP/DISCECTOMY FUSION  2000  . BREAST ENHANCEMENT SURGERY Bilateral   . EYE SURGERY  2016   PVD    Social History   Socioeconomic History  . Marital status: Married    Spouse name: Not on file  . Number of children: 0  . Years of education: 24  . Highest education level: Not on file  Occupational History  .  Occupation: Chiropractor  Tobacco Use  . Smoking status: Former Smoker    Packs/day: 0.50    Years: 30.00    Pack years: 15.00    Types: Cigarettes    Start date: 12/18/1975    Quit date: 11/27/2018    Years since quitting: 1.0  . Smokeless tobacco: Never Used  Substance and Sexual Activity  . Alcohol use: Yes    Alcohol/week: 7.0 standard drinks    Types: 7 Glasses of wine per week    Comment: occas  . Drug use: No  . Sexual activity: Not on file    Comment: Married  Other Topics Concern  . Not on file  Social History Narrative   Lives at home w/ her husband and mother w/ dementia   Right-handed   Caffeine: 3 cups coffee per day   Social Determinants of Health   Financial Resource Strain:   . Difficulty of Paying Living Expenses: Not on file  Food Insecurity:   . Worried About Charity fundraiser in the Last Year: Not on file  . Ran Out of Food in the Last Year: Not on file  Transportation Needs:   . Lack of Transportation (Medical): Not on file  . Lack of Transportation (Non-Medical): Not on file  Physical Activity:   .  Days of Exercise per Week: Not on file  . Minutes of Exercise per Session: Not on file  Stress:   . Feeling of Stress : Not on file  Social Connections:   . Frequency of Communication with Friends and Family: Not on file  . Frequency of Social Gatherings with Friends and Family: Not on file  . Attends Religious Services: Not on file  . Active Member of Clubs or Organizations: Not on file  . Attends Archivist Meetings: Not on file  . Marital Status: Not on file  Intimate Partner Violence:   . Fear of Current or Ex-Partner: Not on file  . Emotionally Abused: Not on file  . Physically Abused: Not on file  . Sexually Abused: Not on file   Review of Systems  Constitution: Positive for weight gain. Negative for chills, decreased appetite and malaise/fatigue.  Cardiovascular: Positive for palpitations (occasional). Negative for dyspnea  on exertion and syncope.  Respiratory: Positive for shortness of breath (better).   Endocrine: Negative for cold intolerance.  Hematologic/Lymphatic: Does not bruise/bleed easily.  Musculoskeletal: Positive for joint pain. Negative for joint swelling.  Gastrointestinal: Positive for diarrhea (chronic) and heartburn. Negative for abdominal pain, anorexia, change in bowel habit, hematochezia and melena.  Neurological: Negative for headaches and light-headedness.  Psychiatric/Behavioral: Negative for depression and substance abuse.  All other systems reviewed and are negative.  Objective  Blood pressure 124/77, height 5' 5"  (1.651 m), weight 162 lb (73.5 kg). Body mass index is 26.96 kg/m.    Vitals with BMI 12/24/2019 11/06/2019 08/06/2019  Height 5' 5"  5' 5"  5' 5"   Weight 162 lbs 167 lbs 188 lbs 6 oz  BMI 26.96 46.96 29.52  Systolic 841 324 401  Diastolic 77 70 65  Pulse - 87 83    Please see exam details from prior visit is as below.  Physical Exam  Constitutional: She appears well-developed and well-nourished. No distress.  HENT:  Head: Atraumatic.  Eyes: Conjunctivae are normal.  Neck: No JVD present. No thyromegaly present.  Cardiovascular: Normal rate, regular rhythm, normal heart sounds and intact distal pulses. Exam reveals no gallop.  No murmur heard. Bilateral leg edema  Pulmonary/Chest: Effort normal and breath sounds normal.  Abdominal: Soft. Bowel sounds are normal.  Musculoskeletal:        General: Normal range of motion.     Cervical back: Neck supple.  Neurological: She is alert.  Skin: Skin is warm and dry.  Psychiatric: She has a normal mood and affect.   Radiology: No results found.  Laboratory examination:   Labs 03/09/2014: Normal CBC, CMP. Total cholesterol 254, triglycerides 208, HDL 51, LDL 161. Non-HDL cholesterol 20 3.  CMP Latest Ref Rng & Units 05/19/2019 09/15/2014  Glucose 65 - 99 mg/dL 114(H) 114(H)  BUN 6 - 24 mg/dL 15 9  Creatinine 0.57 -  1.00 mg/dL 0.78 0.70  Sodium 134 - 144 mmol/L 144 141  Potassium 3.5 - 5.2 mmol/L 4.2 3.6(L)  Chloride 96 - 106 mmol/L 102 97  CO2 20 - 29 mmol/L 27 29  Calcium 8.7 - 10.2 mg/dL 9.5 9.6   CBC Latest Ref Rng & Units 09/15/2014  WBC 4.0 - 10.5 K/uL 7.9  Hemoglobin 12.0 - 15.0 g/dL 13.3  Hematocrit 36.0 - 46.0 % 39.4  Platelets 150 - 400 K/uL 166   Labs 07/28/2019: Total 154, triglycerides 227, HDL 39, LDL 70.  Non-HDL cholesterol 115.  Serum glucose 15 mg, BUN 13, creatinine 0.8, eGFR 75 mL, potassium  3.9. CMP otherwise normal.  CBC normal.  Labs 02/04/2019: HB 13.4/HCT 41.2, platelets 153.  Normal indicis. BUN 18, creatinine 0.7, serum glucose 119 mg.  CMP otherwise normal.  PRN Meds:. Medications Discontinued During This Encounter  Medication Reason  . CRESTOR 10 MG tablet Error  . atenolol (TENORMIN) 50 MG tablet Discontinued by provider   Current Meds  Medication Sig  . ADDERALL XR 25 MG 24 hr capsule Take 25 mg by mouth every morning.   Marland Kitchen ALPRAZolam (XANAX) 1 MG tablet Take 1 mg by mouth 3 (three) times daily.  . Biotin 10 MG CAPS Take by mouth daily.  . Cholecalciferol (VITAMIN D-3) 25 MCG (1000 UT) CAPS Take by mouth daily.  . [employer]S ASPIRIN ADULT LOW DOSE 81 MG chewable tablet daily.  . Cyanocobalamin (VITAMIN B-12 CR PO) Take 6,000 mcg by mouth 2 (two) times a day.   . cyclobenzaprine (FLEXERIL) 10 MG tablet Take 1 tablet (10 mg total) by mouth at bedtime. As needed for pain  . famotidine (PEPCID) 20 MG tablet One after supper  . FLUoxetine (PROZAC) 20 MG capsule Take 40 mg by mouth daily.   . furosemide (LASIX) 20 MG tablet TAKE 1 TABLET BY MOUTH EVERY DAY AS NEEDED  . isosorbide mononitrate (IMDUR) 30 MG 24 hr tablet Take 1 tablet (30 mg total) by mouth daily.  Marland Kitchen levothyroxine (SYNTHROID) 50 MCG tablet Take 50 mcg by mouth daily before breakfast.  . nitrofurantoin, macrocrystal-monohydrate, (MACROBID) 100 MG capsule Take 100 mg by mouth 2 (two) times daily.  Marland Kitchen nystatin  (MYCOSTATIN) 100000 UNIT/ML suspension Take 5 mLs by mouth 2 (two) times a day.  . pantoprazole (PROTONIX) 40 MG tablet TAKE 1 TABLET (40 MG TOTAL) BY MOUTH DAILY. TAKE 30-60 MIN BEFORE FIRST MEAL OF THE DAY  . rosuvastatin (CRESTOR) 10 MG tablet Take 10 mg by mouth daily.  Marland Kitchen spironolactone (ALDACTONE) 50 MG tablet Take 1 tablet (50 mg total) by mouth daily.  . traMADol (ULTRAM) 50 MG tablet Take 1 tablet (50 mg total) by mouth 2 (two) times daily. (Patient taking differently: Take 50 mg by mouth 3 (three) times daily. )  . zolpidem (AMBIEN) 5 MG tablet Take 5 mg by mouth at bedtime as needed for sleep.   . [DISCONTINUED] atenolol (TENORMIN) 50 MG tablet Take 50 mg by mouth daily.  . [DISCONTINUED] CRESTOR 10 MG tablet Take 10 mg by mouth daily.     Cardiac Studies:   Coronary CT angiogram 26-Jun-2019: Coronary calcium score 291.  Moderate long plaque in the proximal LAD, 25 to 49% stenosis, CT FFR plus.  Minimal plaque in other vessels.  Mild calcification of the aortic valve.  Treadmill Stress 04/05/2014: Indications: Screening for CAD.  Conclusions: Negative for ischemia. Normal exercise tolerence. Symptoms: Dyspnea. THR met. Arrhythmia: None. Continue primary prevention.  The patient exercised according to the [facility] protocol, Total time recorded 8 Min. 2 sec.achieving a max heart rate of 147 which was 88% of MPHR for age and 9.2 METS of work. Baseline NIBP was 132/82. Peak NIBP was 170/74 MaxSysp was: 180 MaxDiasp was: 82. The baseline ECG showed NSR,Borderline LVH. No ischemia. During exercise there wasno ST-T changes of ischemia.  Assessment   1. Essential hypertension   2. Chronic diarrhea    EKG 11/06/2019: Normal sinus rhythm at rate of 78 bpm, borderline left atrial abnormality, otherwise normal EKG.   Recommendations:   ALISSAH REDMON  is a 59 y.o.  female  With  family history of  premature coronary artery disease in her father had CAD at 25 Y and died at 77 Years of age, tobacco  use quit in Dec 2019, 15-20 pack year history, hypertension and hyperlipidemia, mild coronary artery disease by coronary CTA on 06/08/2019 with mid LAD 50% stenosis presents here for follow-up of hypertension.    Since being on Aldactone, blood pressure is now very well controlled.  Also potassium levels have normalized.  She is taking furosemide every other day, advised her I will place her on hydrochlorothiazide 12.5 mg daily this way she does not take furosemide except occasionally on a as needed basis.  Due to chronic diarrhea, GI work-up so far has been unyielding, I suspect it could be contributed by atenolol.  Advised her to discontinue atenolol by slowly weaning herself off to see if her diarrhea would improve if not she has to resume atenolol that is being used for palpitations.  I like to set her up for virtual visit in 6 weeks.  If blood pressure is controlled and she remains stable, I will see her back on a as needed basis.  Adrian Prows, MD, [facility] 12/24/2019, 3:50 PM [facility]  CC: Juanita Craver, MD

## 2019-12-30 ENCOUNTER — Other Ambulatory Visit: Payer: Self-pay | Admitting: Internal Medicine

## 2019-12-30 DIAGNOSIS — J45991 Cough variant asthma: Secondary | ICD-10-CM

## 2020-01-23 ENCOUNTER — Other Ambulatory Visit: Payer: Self-pay | Admitting: Internal Medicine

## 2020-01-23 DIAGNOSIS — J45991 Cough variant asthma: Secondary | ICD-10-CM

## 2020-01-27 ENCOUNTER — Other Ambulatory Visit: Payer: Self-pay | Admitting: Cardiology

## 2020-01-27 DIAGNOSIS — I209 Angina pectoris, unspecified: Secondary | ICD-10-CM

## 2020-01-27 DIAGNOSIS — I10 Essential (primary) hypertension: Secondary | ICD-10-CM

## 2020-01-27 NOTE — Telephone Encounter (Signed)
refill 

## 2020-01-29 ENCOUNTER — Other Ambulatory Visit: Payer: Self-pay | Admitting: Cardiology

## 2020-01-29 DIAGNOSIS — I10 Essential (primary) hypertension: Secondary | ICD-10-CM

## 2020-02-05 ENCOUNTER — Encounter: Payer: Self-pay | Admitting: Cardiology

## 2020-02-05 ENCOUNTER — Telehealth: Payer: BC Managed Care – PPO | Admitting: Cardiology

## 2020-02-05 VITALS — BP 115/68 | HR 95 | Ht 65.0 in | Wt 160.0 lb

## 2020-02-05 DIAGNOSIS — I10 Essential (primary) hypertension: Secondary | ICD-10-CM | POA: Diagnosis not present

## 2020-02-05 DIAGNOSIS — R002 Palpitations: Secondary | ICD-10-CM

## 2020-02-05 DIAGNOSIS — K529 Noninfective gastroenteritis and colitis, unspecified: Secondary | ICD-10-CM

## 2020-02-05 DIAGNOSIS — R55 Syncope and collapse: Secondary | ICD-10-CM | POA: Diagnosis not present

## 2020-02-05 DIAGNOSIS — R42 Dizziness and giddiness: Secondary | ICD-10-CM

## 2020-02-05 NOTE — Progress Notes (Signed)
Virtual Visit via Telephone Note: Patient unable to use video assisted device.  This visit type was conducted due to national recommendations for restrictions regarding the COVID-19 Pandemic (e.g. social distancing).  This format is felt to be most appropriate for this patient at this time.  All issues noted in this document were discussed and addressed.  No physical exam was performed.  The patient has consented to conduct a Telehealth visit and understands insurance will be billed.   I connected with@, on 02/05/20 at  by TELEPHONE and verified that I am speaking with the correct person using two identifiers.   I discussed the limitations of evaluation and management by telemedicine and the availability of in person appointments. The patient expressed understanding and agreed to proceed.   I have discussed with patient regarding the safety during COVID Pandemic and steps and precautions to be taken including social distancing, frequent hand wash and use of detergent soap, gels with the patient. I asked the patient to avoid touching mouth, nose, eyes, ears with the hands. I encouraged regular walking around the neighborhood and exercise and regular diet, as long as social distancing can be maintained.   Primary Physician/Referring:  Deland Pretty, MD  Patient ID: Sharon Huffman, female    DOB: 01-17-1961, 59 y.o.   MRN: [id number]  Chief Complaint  Patient presents with  . Hypertension  . Diarrhea  . Follow-up    6 weeks    HPI: Sharon Huffman  is a 59 y.o. female  With  family history of premature coronary artery disease in her father had CAD at 26 Y and died at 75 Years of age, tobacco use quit in Dec 2019, 15-20 pack year history, hypertension and hyperlipidemia, mild coronary artery disease by coronary CTA on 06/08/2019 with mid LAD 50% stenosis presents here for follow-up of hypertension.    This is a virtual visit for follow-up of hypertension, palpitations.  Over the past few weeks has  noticed marked dizziness and near syncopal spells and has noticed her blood pressure to be low.  She has continued to have frequent diarrhea and also now has started to notice palpitations and headaches since discontinuing atenolol which was due to chronic diarrhea.  Since discontinuing atenolol diarrhea has improved some.  Past Medical History:  Diagnosis Date  . ADHD   . Anxiety   . Bronchitis, chronic ([place])   . Cervical cancer ([place])   . Depression   . Depression   . Hyperlipidemia   . Hypertension   . Insomnia disorder   . Lyme disease   . Migraine   . Ovarian cancer ([place])   . Palpitations     Past Surgical History:  Procedure Laterality Date  . ABDOMINAL HYSTERECTOMY  1994  . ANTERIOR CERVICAL DECOMP/DISCECTOMY FUSION  2000  . BREAST ENHANCEMENT SURGERY Bilateral   . EYE SURGERY  2016   PVD   Social History   Tobacco Use  . Smoking status: Former Smoker    Packs/day: 0.50    Years: 30.00    Pack years: 15.00    Types: Cigarettes    Start date: 12/18/1975    Quit date: 11/27/2018    Years since quitting: 1.1  . Smokeless tobacco: Never Used  Substance Use Topics  . Alcohol use: Yes    Alcohol/week: 7.0 standard drinks    Types: 7 Glasses of wine per week    Comment: occas    Family History  Problem Relation Age of Onset  .  Dementia Mother   . Hypertension Mother   . Hyperlipidemia Mother   . Heart attack Father        68  . Heart disease Father     Review of Systems  Constitution: Positive for weight loss. Negative for decreased appetite, malaise/fatigue and weight gain.  Cardiovascular: Positive for near-syncope and palpitations (occasional). Negative for dyspnea on exertion and syncope.  Respiratory: Positive for shortness of breath (better).   Endocrine: Negative for cold intolerance.  Hematologic/Lymphatic: Does not bruise/bleed easily.  Musculoskeletal: Positive for joint pain. Negative for joint swelling.  Gastrointestinal: Positive for diarrhea  and heartburn. Negative for abdominal pain, anorexia, change in bowel habit and melena.  Neurological: Positive for dizziness, headaches and loss of balance. Negative for light-headedness.  Psychiatric/Behavioral: Positive for memory loss. Negative for depression and substance abuse.  All other systems reviewed and are negative.  Objective  Blood pressure 115/68, pulse 95, height 5' 5"  (1.651 m), weight 160 lb (72.6 kg). Body mass index is 26.63 kg/m.   Vitals with BMI 02/05/2020 12/24/2019 11/06/2019  Height 5' 5"  5' 5"  5' 5"   Weight 160 lbs 162 lbs 167 lbs  BMI 26.63 71.69 67.89  Systolic 381 017 510  Diastolic 68 77 70  Pulse 95 - 87    Physical exam not performed or limited due to virtual visit.    Physical Exam  Constitutional: She appears well-developed and well-nourished. No distress.  HENT:  Head: Atraumatic.  Eyes: Conjunctivae are normal.  Neck: No JVD present. No thyromegaly present.  Cardiovascular: Normal rate, regular rhythm, normal heart sounds and intact distal pulses. Exam reveals no gallop.  No murmur heard. Bilateral leg edema  Pulmonary/Chest: Effort normal and breath sounds normal.  Abdominal: Soft. Bowel sounds are normal.  Musculoskeletal:        General: Normal range of motion.     Cervical back: Neck supple.  Neurological: She is alert.  Skin: Skin is warm and dry.  Psychiatric: She has a normal mood and affect.   Radiology: No results found.  Laboratory examination:   CMP Latest Ref Rng & Units 05/19/2019 09/15/2014  Glucose 65 - 99 mg/dL 114(H) 114(H)  BUN 6 - 24 mg/dL 15 9  Creatinine 0.57 - 1.00 mg/dL 0.78 0.70  Sodium 134 - 144 mmol/L 144 141  Potassium 3.5 - 5.2 mmol/L 4.2 3.6(L)  Chloride 96 - 106 mmol/L 102 97  CO2 20 - 29 mmol/L 27 29  Calcium 8.7 - 10.2 mg/dL 9.5 9.6   CBC Latest Ref Rng & Units 09/15/2014  WBC 4.0 - 10.5 K/uL 7.9  Hemoglobin 12.0 - 15.0 g/dL 13.3  Hematocrit 36.0 - 46.0 % 39.4  Platelets 150 - 400 K/uL 166   Labs  03/09/2014: Normal CBC, CMP. Total cholesterol 254, triglycerides 208, HDL 51, LDL 161. Non-HDL cholesterol 20 3.  Labs 07/28/2019: Total 154, triglycerides 227, HDL 39, LDL 70.  Non-HDL cholesterol 115.  Serum glucose 15 mg, BUN 13, creatinine 0.8, eGFR 75 mL, potassium 3.9. CMP otherwise normal.  CBC normal.  09/25/2019: Cholesterol, total 184.000 M 07/21/2018 HDL 39.000 07/28/2019 LDL N/D Triglycerides 227.000 07/28/2019  Hemoglobin N/D Creatinine, Serum 0.940 MG/ 10/01/2019 Potassium 4.200 05/19/2019 ALT (SGPT) 23.000 IU/ 07/21/2018  TSH 1.740 09/25/2019  PRN Meds:. Medications Discontinued During This Encounter  Medication Reason  . [facility] ASPIRIN ADULT LOW DOSE 81 MG chewable tablet Error  . Cyanocobalamin (VITAMIN B-12 CR PO) Error  . KLOR-CON M20 20 MEQ tablet Error  . furosemide (LASIX) 20  MG tablet Error  . hydrochlorothiazide (HYDRODIURIL) 12.5 MG tablet Change in therapy  . isosorbide mononitrate (IMDUR) 30 MG 24 hr tablet Discontinued by provider   Current Meds  Medication Sig  . ADDERALL XR 25 MG 24 hr capsule Take 25 mg by mouth every morning.   Marland Kitchen ALPRAZolam (XANAX) 1 MG tablet Take 1 mg by mouth 3 (three) times daily.  . Biotin 10 MG CAPS Take by mouth daily.  . Cholecalciferol (VITAMIN D-3) 25 MCG (1000 UT) CAPS Take by mouth daily.  . cyclobenzaprine (FLEXERIL) 10 MG tablet Take 1 tablet (10 mg total) by mouth at bedtime. As needed for pain  . famotidine (PEPCID) 20 MG tablet One after supper  . FLUoxetine (PROZAC) 20 MG capsule Take 40 mg by mouth daily.   Marland Kitchen levothyroxine (SYNTHROID) 50 MCG tablet Take 50 mcg by mouth daily before breakfast.  . nitrofurantoin, macrocrystal-monohydrate, (MACROBID) 100 MG capsule Take 100 mg by mouth 2 (two) times daily.  Marland Kitchen nystatin (MYCOSTATIN) 100000 UNIT/ML suspension Take 5 mLs by mouth 2 (two) times a day.  . pantoprazole (PROTONIX) 40 MG tablet TAKE 1 TABLET (40 MG TOTAL) BY MOUTH DAILY. TAKE 30-60 MIN BEFORE FIRST MEAL OF THE DAY  .  rosuvastatin (CRESTOR) 10 MG tablet Take 10 mg by mouth daily.  Marland Kitchen spironolactone (ALDACTONE) 50 MG tablet TAKE 1 TABLET BY MOUTH EVERY DAY  . traMADol (ULTRAM) 50 MG tablet Take 1 tablet (50 mg total) by mouth 2 (two) times daily. (Patient taking differently: Take 50 mg by mouth 3 (three) times daily. )  . zolpidem (AMBIEN) 5 MG tablet Take 5 mg by mouth at bedtime as needed for sleep.   . [DISCONTINUED] hydrochlorothiazide (HYDRODIURIL) 12.5 MG tablet Take 1 tablet (12.5 mg total) by mouth daily.  . [DISCONTINUED] isosorbide mononitrate (IMDUR) 30 MG 24 hr tablet TAKE 1 TABLET BY MOUTH EVERY DAY    Cardiac Studies:   Coronary CT angiogram 07-04-2019: Coronary calcium score 291.  Moderate long plaque in the proximal LAD, 25 to 49% stenosis, CT FFR plus.  Minimal plaque in other vessels.  Mild calcification of the aortic valve.  Treadmill Stress 04/05/2014: Indications: Screening for CAD.  Conclusions: Negative for ischemia. Normal exercise tolerence. Symptoms: Dyspnea. THR met. Arrhythmia: None. Continue primary prevention.  The patient exercised according to the Bruce protocol, Total time recorded 8 Min. 2 sec.achieving a max heart rate of 147 which was 88% of MPHR for age and 9.2 METS of work. Baseline NIBP was 132/82. Peak NIBP was 170/74 MaxSysp was: 180 MaxDiasp was: 82. The baseline ECG showed NSR,Borderline LVH. No ischemia. During exercise there wasno ST-T changes of ischemia.  Assessment   1. Near syncope   2. Dizziness   3. Palpitations   4. Essential hypertension   5. Chronic diarrhea    EKG 11/06/2019: Normal sinus rhythm at rate of 78 bpm, borderline left atrial abnormality, otherwise normal EKG.   Recommendations:    Sharon Huffman  is a 59 y.o.  female  With  family history of premature coronary artery disease in her father had CAD at 49 Y and died at 28 Years of age, tobacco use quit in Dec 2019, 15-20 pack year history, hypertension and hyperlipidemia, mild coronary artery  disease by coronary CTA on 04-Jul-2019 with mid LAD 50% stenosis presents here for follow-up of hypertension.    On her last office visit I discontinued atenolol due to frequent diarrhea, symptoms and has improved.  However she continues to have  very low blood pressure, has had near syncopal spells due to dizziness.  Suspect severe orthostasis.  She continues to lose weight and also complains of palpitation.  I reviewed her external labs, TSH has been normal.  Do not know the reason for abnormal weight loss.  I would like to her back in the office with orthostatic check and follow-up on hypertension and palpitations.  I may consider addition of verapamil although previously with amlodipine she had dropped severe leg edema and had to be careful.  We could also try a different beta-blocker.  I do not know the exact reason for chronic diarrhea.  She was also on Imdur 30 mg daily for exertional chest pain suggestive of angina pectoris, prior CTA heart had revealed moderate disease without high-grade stenosis.  Will discontinue Imdur in view of dizziness and near syncope.  Follow-up on chest pain as well.  Adrian Prows, MD, [facility] 02/05/2020, 2:26 PM [facility]

## 2020-02-18 ENCOUNTER — Encounter: Payer: Self-pay | Admitting: Cardiology

## 2020-02-18 ENCOUNTER — Ambulatory Visit (INDEPENDENT_AMBULATORY_CARE_PROVIDER_SITE_OTHER): Payer: BC Managed Care – PPO | Admitting: Cardiology

## 2020-02-18 ENCOUNTER — Other Ambulatory Visit: Payer: Self-pay

## 2020-02-18 VITALS — Temp 97.4°F | Resp 16 | Ht 65.0 in | Wt 151.5 lb

## 2020-02-18 DIAGNOSIS — R002 Palpitations: Secondary | ICD-10-CM | POA: Diagnosis not present

## 2020-02-18 DIAGNOSIS — I10 Essential (primary) hypertension: Secondary | ICD-10-CM

## 2020-02-18 DIAGNOSIS — R42 Dizziness and giddiness: Secondary | ICD-10-CM | POA: Diagnosis not present

## 2020-02-18 DIAGNOSIS — I951 Orthostatic hypotension: Secondary | ICD-10-CM

## 2020-02-18 DIAGNOSIS — I491 Atrial premature depolarization: Secondary | ICD-10-CM

## 2020-02-18 MED ORDER — PROPRANOLOL HCL 20 MG PO TABS: 20.0000 mg | ORAL_TABLET | Freq: Three times a day (TID) | ORAL | 2 refills | Status: DC

## 2020-02-18 NOTE — Progress Notes (Signed)
Primary Physician/Referring:  Deland Pretty, MD  Patient ID: Sharon Huffman, female    DOB: [birth year], 59 y.o.   MRN: [id number]  Chief Complaint  Patient presents with  . Hypertension  . Dizziness    HPI: Sharon Huffman  is a 59 y.o. female  female  With  family history of premature coronary artery disease in her father had CAD at 52 Y and died at 75 Years of age, tobacco use quit in Dec 2019, 15-20 pack year history, hypertension and hyperlipidemia, mild coronary artery disease by coronary CTA on 06/08/2019 with mid LAD 50% stenosis.  Since weight loss and blood pressure control, anginal symptoms are resolved.  I had seen her on a virtual visit a month ago where she had complained of near syncope and palpitations.  States that however symptoms are resolved 2 weeks ago except for persistent palpitations since discontinuing atenolol. However chronic diarrhea has resolved since discontinuing atenolol.   Past Medical History:  Diagnosis Date  . ADHD   . Anxiety   . Bronchitis, chronic ([place])   . Cervical cancer ([place])   . Depression   . Depression   . Hyperlipidemia   . Hypertension   . Insomnia disorder   . Lyme disease   . Migraine   . Ovarian cancer ([place])   . Palpitations     Past Surgical History:  Procedure Laterality Date  . ABDOMINAL HYSTERECTOMY  1994  . ANTERIOR CERVICAL DECOMP/DISCECTOMY FUSION  2000  . BREAST ENHANCEMENT SURGERY Bilateral   . EYE SURGERY  2016   PVD   Social History   Tobacco Use  . Smoking status: Former Smoker    Packs/day: 0.50    Years: 30.00    Pack years: 15.00    Types: Cigarettes    Start date: 12/18/1975    Quit date: 11/27/2018    Years since quitting: 1.2  . Smokeless tobacco: Never Used  Substance Use Topics  . Alcohol use: Yes    Alcohol/week: 7.0 standard drinks    Types: 7 Glasses of wine per week    Comment: occas    Family History  Problem Relation Age of Onset  . Dementia Mother   . Hypertension Mother   .  Hyperlipidemia Mother   . Heart attack Father        24  . Heart disease Father     Review of Systems  Constitution: Positive for weight loss. Negative for decreased appetite and weight gain.  Cardiovascular: Positive for palpitations (occasional). Negative for dyspnea on exertion and syncope.  Endocrine: Negative for cold intolerance.  Hematologic/Lymphatic: Does not bruise/bleed easily.  Musculoskeletal: Positive for joint pain.  Gastrointestinal: Positive for diarrhea and heartburn. Negative for abdominal pain, anorexia, change in bowel habit and melena.  Neurological: Positive for dizziness and loss of balance. Negative for light-headedness.  Psychiatric/Behavioral: Positive for memory loss.  All other systems reviewed and are negative.  Objective  Temperature (!) 97.4 F (36.3 C), temperature source Temporal, resp. rate 16, height 5' 5"  (1.651 m), weight 151 lb 8 oz (68.7 kg), SpO2 97 %. Body mass index is 25.21 kg/m.   Vitals with BMI 02/18/2020 02/05/2020 12/24/2019  Height 5' 5"  5' 5"  5' 5"   Weight 151 lbs 8 oz 160 lbs 162 lbs  BMI 25.21 00.76 22.63  Systolic - 335 456  Diastolic - 68 77  Pulse - 95 -   Orthostatic VS for the past 72 hrs (Last 3 readings):  Orthostatic BP  Patient Position BP Location Cuff Size Orthostatic Pulse  02/18/20 1501 116/87 Standing Left Arm Normal 88  02/18/20 1500 116/87 Sitting Left Arm Normal 86  02/18/20 1451 143/84 Supine Left Arm Normal 95    Physical Exam  Constitutional: She appears well-developed and well-nourished. No distress.  Neck: No JVD present.  Cardiovascular: Normal rate, regular rhythm, normal heart sounds and intact distal pulses.  Occasional extrasystoles are present. Exam reveals no gallop.  No murmur heard. No leg edema, no JVD  Pulmonary/Chest: Effort normal and breath sounds normal.  Abdominal: Soft. Bowel sounds are normal.  Musculoskeletal:        General: Normal range of motion.   Radiology: No results  found.  Laboratory examination:   CMP Latest Ref Rng & Units 05/19/2019 09/15/2014  Glucose 65 - 99 mg/dL 114(H) 114(H)  BUN 6 - 24 mg/dL 15 9  Creatinine 0.57 - 1.00 mg/dL 0.78 0.70  Sodium 134 - 144 mmol/L 144 141  Potassium 3.5 - 5.2 mmol/L 4.2 3.6(L)  Chloride 96 - 106 mmol/L 102 97  CO2 20 - 29 mmol/L 27 29  Calcium 8.7 - 10.2 mg/dL 9.5 9.6   CBC Latest Ref Rng & Units 09/15/2014  WBC 4.0 - 10.5 K/uL 7.9  Hemoglobin 12.0 - 15.0 g/dL 13.3  Hematocrit 36.0 - 46.0 % 39.4  Platelets 150 - 400 K/uL 166   Labs 03/09/2014: Normal CBC, CMP. Total cholesterol 254, triglycerides 208, HDL 51, LDL 161. Non-HDL cholesterol 20 3.  Labs 07/28/2019: Total 154, triglycerides 227, HDL 39, LDL 70.  Non-HDL cholesterol 115.  Serum glucose 15 mg, BUN 13, creatinine 0.8, eGFR 75 mL, potassium 3.9. CMP otherwise normal.  CBC normal.  09/25/2019: Cholesterol, total 184.000 M 07/21/2018 HDL 39.000 07/28/2019 LDL N/D Triglycerides 227.000 07/28/2019  Hemoglobin N/D Creatinine, Serum 0.940 MG/ 10/01/2019 Potassium 4.200 05/19/2019 ALT (SGPT) 23.000 IU/ 07/21/2018  TSH 1.740 09/25/2019  PRN Meds:. Medications Discontinued During This Encounter  Medication Reason  . nitrofurantoin, macrocrystal-monohydrate, (MACROBID) 100 MG capsule Error  . nystatin (MYCOSTATIN) 100000 UNIT/ML suspension Error  . isosorbide mononitrate (IMDUR) 30 MG 24 hr tablet Discontinued by provider   Current Meds  Medication Sig  . ADDERALL XR 25 MG 24 hr capsule Take 25 mg by mouth every morning.   Marland Kitchen ALPRAZolam (XANAX) 1 MG tablet Take 1 mg by mouth 3 (three) times daily.  . Biotin 10 MG CAPS Take by mouth daily.  . Cholecalciferol (VITAMIN D-3) 25 MCG (1000 UT) CAPS Take by mouth daily.  . cyclobenzaprine (FLEXERIL) 10 MG tablet Take 1 tablet (10 mg total) by mouth at bedtime. As needed for pain  . famotidine (PEPCID) 20 MG tablet One after supper  . FLUoxetine (PROZAC) 20 MG capsule Take 40 mg by mouth daily.   .  hydrochlorothiazide (HYDRODIURIL) 12.5 MG tablet Take 12.5 mg by mouth daily.  Marland Kitchen levothyroxine (SYNTHROID) 50 MCG tablet Take 50 mcg by mouth daily before breakfast.  . nitroGLYCERIN (NITROSTAT) 0.4 MG SL tablet Place 0.4 mg under the tongue every 5 (five) minutes as needed for chest pain.  . pantoprazole (PROTONIX) 40 MG tablet TAKE 1 TABLET (40 MG TOTAL) BY MOUTH DAILY. TAKE 30-60 MIN BEFORE FIRST MEAL OF THE DAY  . rosuvastatin (CRESTOR) 10 MG tablet Take 10 mg by mouth daily.  Marland Kitchen spironolactone (ALDACTONE) 50 MG tablet TAKE 1 TABLET BY MOUTH EVERY DAY  . traMADol (ULTRAM) 50 MG tablet Take 1 tablet (50 mg total) by mouth 2 (two) times daily. (Patient taking  differently: Take 50 mg by mouth 3 (three) times daily. )  . zolpidem (AMBIEN) 5 MG tablet Take 5 mg by mouth at bedtime as needed for sleep.   . [DISCONTINUED] isosorbide mononitrate (IMDUR) 30 MG 24 hr tablet Take 30 mg by mouth daily.   Cardiac Studies:   Treadmill Stress 04/05/2014: Indications: Screening for CAD.  Conclusions: Negative for ischemia. Normal exercise tolerence. Symptoms: Dyspnea. THR met. Arrhythmia: None. Continue primary prevention.  The patient exercised according to the Bruce protocol, Total time recorded 8 Min. 2 sec.achieving a max heart rate of 147 which was 88% of MPHR for age and 9.2 METS of work. Baseline NIBP was 132/82. Peak NIBP was 170/74 MaxSysp was: 180 MaxDiasp was: 82. The baseline ECG showed NSR,Borderline LVH. No ischemia. During exercise there wasno ST-T changes of ischemia.  Echocardiogram 06/01/2019:  Normal LV systolic function with EF 56%. Left ventricle cavity is normal  in size. Mild concentric hypertrophy of the left ventricle. Normal global  wall motion. Normal diastolic filling pattern. Calculated EF 56%.  Aneurysmal interatrial septum with possible PFO.  Mild (Grade I) mitral regurgitation.  IVC is dilated with respiratory variation.  No evidence of pulmonary hypertension.  Coronary  CT angiogram 06/08/2019: Coronary calcium score 291.  Moderate long plaque in the proximal LAD, 25 to 49% stenosis, CT FFR plus.  Minimal plaque in other vessels.  Mild calcification of the aortic valve.  EKG 02/18/2020: Normal sinus rhythm with rate of 81 bpm, left atrial abnormality, 3 beat run of atrial tachycardia.  No evidence of ischemia.   Compared to 11/06/2019, 3 beat atrial tachycardia new.    Assessment   1. Dizziness   2. Orthostatic hypotension   3. Palpitations   4. Essential hypertension   5. PAC (premature atrial contraction)    Recommendations:   Sharon Huffman  is a 59 y.o.  female  With  family history of premature coronary artery disease in her father had CAD at 80 Y and died at 26 Years of age, tobacco use quit in Dec 2019, 15-20 pack year history, hypertension and hyperlipidemia, mild coronary artery disease by coronary CTA on 06/08/2019 with mid LAD 50% stenosis.  Since weight loss and blood pressure control, anginal symptoms are resolved.  I had seen her on a virtual visit a month ago where she had complained of near syncope and palpitations.  States that however symptoms are resolved 2 weeks ago except for persistent palpitations.  She has continued to lose weight and blood pressure has remained stable, previously uncontrolled.  Diarrhea has resolved since stopping atenolol. Near syncopal spells she had complained about and dizziness could be due to orthostatic hypotension. She was orthostatic today.   Extremely vague and difficult and multiple somatic issues which I am not able to pinpoint.  Her palpitations are clearly related to PACs and 3 beat run of atrial tachycardia.  As she is severely intolerant to calcium channel blocker with leg edema development, I will challenge her with propranolol 20 mg p.o. 3 times daily and have discussed GI issues with the patient and to discontinue immediately if she has recurrence of diarrhea.  Not sure why she is losing weight.  Recent  TSH and labs reviewed and they are normal.  Patient also states that since she had chronic diarrhea she made lifestyle changes and has continued with being careful with food and she is pleased with weight loss.  Unless she continues to have new symptoms, I will see him back  in 3 months.   Adrian Prows, MD, [facility] 02/18/2020, 9:25 PM [facility]

## 2020-02-19 DIAGNOSIS — E119 Type 2 diabetes mellitus without complications: Secondary | ICD-10-CM | POA: Diagnosis not present

## 2020-02-24 DIAGNOSIS — R002 Palpitations: Secondary | ICD-10-CM

## 2020-02-24 MED ORDER — VERAPAMIL HCL 40 MG PO TABS: 80.0000 mg | ORAL_TABLET | Freq: Three times a day (TID) | ORAL | 1 refills | Status: DC | PRN

## 2020-02-24 NOTE — Telephone Encounter (Signed)
ICD-10-CM   1. Palpitations  R00.2 verapamil (CALAN) 40 MG tablet    Medications Discontinued During This Encounter  Medication Reason  . propranolol (INDERAL) 20 MG tablet Side effect (s)   Patient called [place] about abdominal discomfort, fatigue, did improve her palpitations.  We will switch her to verapamil and see how she does.

## 2020-03-04 ENCOUNTER — Other Ambulatory Visit: Payer: Self-pay | Admitting: Cardiology

## 2020-03-04 DIAGNOSIS — R002 Palpitations: Secondary | ICD-10-CM

## 2020-03-14 NOTE — Telephone Encounter (Signed)
From pt

## 2020-03-15 ENCOUNTER — Other Ambulatory Visit: Payer: Self-pay | Admitting: Internal Medicine

## 2020-03-15 ENCOUNTER — Other Ambulatory Visit: Payer: Self-pay | Admitting: Cardiology

## 2020-03-15 DIAGNOSIS — I10 Essential (primary) hypertension: Secondary | ICD-10-CM

## 2020-03-15 DIAGNOSIS — J45991 Cough variant asthma: Secondary | ICD-10-CM

## 2020-03-23 ENCOUNTER — Other Ambulatory Visit: Payer: Self-pay | Admitting: Cardiology

## 2020-03-23 DIAGNOSIS — R002 Palpitations: Secondary | ICD-10-CM

## 2020-03-30 ENCOUNTER — Telehealth: Payer: Self-pay

## 2020-03-30 DIAGNOSIS — I10 Essential (primary) hypertension: Secondary | ICD-10-CM

## 2020-03-30 MED ORDER — NEBIVOLOL HCL 10 MG PO TABS: 10.0000 mg | ORAL_TABLET | Freq: Every day | ORAL | 0 refills | Status: DC

## 2020-03-30 NOTE — Telephone Encounter (Signed)
Per Dr. Einar Gip, patient is to start BYSTOLIC 10mg  1xDay. Patient is aware. She asked if she is to continue VERAPMIL, per Dr. Einar Gip, she is to STOP the Verapamil while on the [facility], patient is aware. Samples are supplied and waiting at the front for patient to pick up.

## 2020-04-14 ENCOUNTER — Encounter: Payer: Self-pay | Admitting: Cardiology

## 2020-04-14 ENCOUNTER — Other Ambulatory Visit: Payer: Self-pay | Admitting: Cardiology

## 2020-04-14 ENCOUNTER — Telehealth: Payer: Self-pay

## 2020-04-14 DIAGNOSIS — R002 Palpitations: Secondary | ICD-10-CM

## 2020-04-14 DIAGNOSIS — I10 Essential (primary) hypertension: Secondary | ICD-10-CM

## 2020-04-14 MED ORDER — ACEBUTOLOL HCL 200 MG PO CAPS: 200.0000 mg | ORAL_CAPSULE | Freq: Every evening | ORAL | 1 refills | Status: DC

## 2020-04-14 NOTE — Telephone Encounter (Signed)
Bystolic is not working average bp is 98/50 heart rate of 80. Also it makes her sweat and get clammy, she is not sleeping and it is making her extremely hungry. Which she is not happy about.

## 2020-04-14 NOTE — Telephone Encounter (Signed)
Patient with multiple medication intolerances, will discontinue Bystolic and will try acebutolol 200 mg in the evening daily.  Previously on atenolol she had diarrhea and will keep a close eye on this.    ICD-10-CM   1. Palpitations  R00.2 acebutolol (SECTRAL) 200 MG capsule  2. Essential hypertension  I10 acebutolol (SECTRAL) 200 MG capsule   Adrian Prows, MD, [facility] 04/14/2020, 7:01 PM [facility] [address] Office: [phone]

## 2020-04-15 ENCOUNTER — Other Ambulatory Visit: Payer: Self-pay

## 2020-05-07 ENCOUNTER — Other Ambulatory Visit: Payer: Self-pay | Admitting: Cardiology

## 2020-05-07 DIAGNOSIS — R002 Palpitations: Secondary | ICD-10-CM

## 2020-05-07 DIAGNOSIS — I10 Essential (primary) hypertension: Secondary | ICD-10-CM

## 2020-05-19 NOTE — Progress Notes (Signed)
Primary Physician/Referring:  Deland Pretty, MD  Patient ID: Sharon Huffman, female    DOB: 1961/08/07, 59 y.o.   MRN: [id number]  Chief Complaint  Patient presents with  . Follow-up    3 months  . Palpitations  . Hypertension  . Coronary Artery Disease   HPI:    Sharon Huffman  is a 59 y.o. female with  family history of premature coronary artery disease in her father had CAD at 67 Y and died at 35 Years of age, tobacco use quit in Dec 2019, 15-20 pack year history, hypertension and hyperlipidemia, mild coronary artery disease by coronary CTA on 06/08/2019 with mid LAD 50% stenosis.  She now presents for a 73-monthoffice visit, states that for the past 2 months she has been having frequent episodes of chest pain with radiation to her jaw and/or back with heavy exertional activity.  She is also noticed shortness of breath.  Otherwise states that her blood pressure has been stable and she has not had any dizziness or palpitations.  Past Medical History:  Diagnosis Date  . ADHD   . Anxiety   . Bronchitis, chronic ([facility]   . Cervical cancer ([place]   . Depression   . Depression   . Hyperlipidemia   . Hypertension   . Insomnia disorder   . Lyme disease   . Migraine   . [place]varian cancer ([place]   . Palpitations    Past Surgical History:  Procedure Laterality Date  . ABDOMINAL HYSTERECTOMY  1994  . ANTERIOR CERVICAL DECOMP/DISCECTOMY FUSION  2000  . BREAST ENHANCEMENT SURGERY Bilateral   . EYE SURGERY  2016   PVD   Family History  Problem Relation Age of Onset  . Dementia Mother   . Hypertension Mother   . Hyperlipidemia Mother   . Heart attack Father        89 . Heart disease Father     Social History   Tobacco Use  . Smoking status: Former Smoker    Packs/day: 0.50    Years: 30.00    Pack years: 15.00    Types: Cigarettes    Start date: 12/18/1975    Quit date: 11/27/2018    Years since quitting: 1.4  . Smokeless tobacco: Never Used  Substance Use Topics  . Alcohol  use: Yes    Alcohol/week: 7.0 standard drinks    Types: 7 Glasses of wine per week    Comment: occas   Marital Status: Married  ROS  Review of Systems  Constitution: Positive for malaise/fatigue.  Cardiovascular: Positive for chest pain, dyspnea on exertion and palpitations (occasional). Negative for leg swelling and syncope.  Musculoskeletal: Positive for joint pain.  Gastrointestinal: Negative for diarrhea, heartburn and melena.  Neurological: Negative for dizziness and loss of balance.  Psychiatric/Behavioral: Negative for memory loss.  All other systems reviewed and are negative.  Objective  Blood pressure 99/68, pulse 84, resp. rate 16, height _0  (1.651 m), weight 141 lb (64 kg), SpO2 97 %.  Vitals with BMI 05/20/2020 02/18/2020 02/05/2020  Height _1  _2  _3   Weight 141 lbs 151 lbs 8 oz 160 lbs  BMI 23.46 [id number] Systolic 99 - 1208 Diastolic 68 - 68  Pulse 84 - 95     Physical Exam  Constitutional: She appears well-developed and well-nourished. No distress.  Cardiovascular: Normal rate, regular rhythm and intact distal pulses.  Occasional extrasystoles are present. Exam reveals no gallop.  No murmur  heard. No leg edema, no JVD.   Pulmonary/Chest: Effort normal and breath sounds normal. No accessory muscle usage.  Abdominal: Soft. Bowel sounds are normal.   Laboratory examination:   No results for input(s): NA, K, CL, CO2, GLUCOSE, BUN, CREATININE, CALCIUM, GFRNONAA, GFRAA in the last 8760 hours. CrCl cannot be calculated (Patient's most recent lab result is older than the maximum 21 days allowed.).  CMP Latest Ref Rng & Units 05/19/2019 09/15/2014  Glucose 65 - 99 mg/dL 114(H) 114(H)  BUN 6 - 24 mg/dL 15 9  Creatinine 0.57 - 1.00 mg/dL 0.78 0.70  Sodium 134 - 144 mmol/L 144 141  Potassium 3.5 - 5.2 mmol/L 4.2 3.6(L)  Chloride 96 - 106 mmol/L 102 97  CO2 20 - 29 mmol/L 27 29  Calcium 8.7 - 10.2 mg/dL 9.5 9.6   CBC Latest Ref Rng & Units 09/15/2014  WBC 4.0 -  10.5 K/uL 7.9  Hemoglobin 12.0 - 15.0 g/dL 13.3  Hematocrit 36.0 - 46.0 % 39.4  Platelets 150 - 400 K/uL 166   External labs:  09/25/2019: Cholesterol, total 184.000 M 07/21/2018 HDL 39.000 07/28/2019 Triglycerides 227.000 07/28/2019  Creatinine, Serum 0.940 MG/ 10/01/2019 Potassium 4.200 05/19/2019 ALT (SGPT) 23.000 IU/ 07/21/2018  TSH 1.740 09/25/2019  07/28/2019: Total 154, triglycerides 227, HDL 39, LDL 70.  Non-HDL cholesterol 115.  Serum glucose 15 mg, BUN 13, creatinine 0.8, eGFR 75 mL, potassium 3.9. CMP otherwise normal.  CBC normal.  03/09/2014: Normal CBC, CMP. Total cholesterol 254, triglycerides 208, HDL 51, LDL 161. Non-HDL cholesterol 20 3.  Medications and allergies   Allergies  Allergen Reactions  . Amlodipine Swelling and Other (See Comments)    Leg edema and fluid retention  . Bystolic [Nebivolol Hcl] Other (See Comments)    sweat and get clammy  . Sulfacetamide Nausea Only  . Erythromycin Nausea And Vomiting     Current Outpatient Medications  Medication Instructions  . acebutolol (SECTRAL) 200 mg, Oral, Every evening  . ADDERALL XR 25 MG 24 hr capsule 25 mg, Oral, BH-each morning  . ALPRAZolam (XANAX) 1 mg, Oral, 3 times daily  . Biotin 10 MG CAPS Oral, Daily  . Cholecalciferol (VITAMIN D-3) 25 MCG (1000 UT) CAPS Oral, Daily  . Cranberry-Cholecalciferol 4200-500 MG-UNIT CAPS 4,200 mg, Oral  . cyclobenzaprine (FLEXERIL) 10 mg, Oral, Daily at bedtime, As needed for pain  . famotidine (PEPCID) 20 MG tablet One after supper  . FLUoxetine (PROZAC) 40 mg, Oral, Daily  . hydrochlorothiazide (HYDRODIURIL) 12.5 MG tablet TAKE 1 TABLET BY MOUTH EVERY DAY  . ipratropium (ATROVENT) 0.06 % nasal spray 2 sprays, Each Nare, 3 times daily  . KLOR-CON M20 20 MEQ tablet 20 mEq, Oral, Daily  . levothyroxine (SYNTHROID) 50 mcg, Oral, Daily before breakfast  . nitroGLYCERIN (NITROSTAT) 0.4 mg, Sublingual, Every 5 min PRN  . pantoprazole (PROTONIX) 40 mg, Oral, Daily, Take 30-60  min before first meal of the day  . rosuvastatin (CRESTOR) 10 mg, Oral, Daily  . spironolactone (ALDACTONE) 50 MG tablet TAKE 1 TABLET BY MOUTH EVERY DAY  . traMADol (ULTRAM) 50 mg, Oral, 2 times daily  . zolpidem (AMBIEN) 5 mg, Oral, At bedtime PRN   There are no discontinued medications.  Radiology:   Coronary CT angiogram 06/08/2019: Coronary calcium score 291.  Moderate long plaque in the proximal LAD, 25 to 49% stenosis, CT FFR plus.  Minimal plaque in other vessels.  Mild calcification of the aortic valve.  Cardiac Studies:   Treadmill Stress 04/05/2014: Indications: Screening  for CAD. Conclusions: Negative for ischemia. Normal exercise tolerence. Symptoms: Dyspnea. THR met. Arrhythmia: None. Continue primary prevention.  The patient exercised according to the Bruce protocol, Total time recorded 8 Min. 2 sec.achieving a max heart rate of 147 which was 88% of MPHR for age and 9.2 METS of work. Baseline NIBP was 132/82. Peak NIBP was 170/74 MaxSysp was: 180 MaxDiasp was: 82. The baseline ECG showed NSR,Borderline LVH. No ischemia. During exercise there wasno ST-T changes of ischemia.  Echocardiogram 06/01/2019:  Normal LV systolic function with EF 56%. Left ventricle cavity is normal  in size. Mild concentric hypertrophy of the left ventricle. Normal global  wall motion. Normal diastolic filling pattern. Calculated EF 56%.  Aneurysmal interatrial septum with possible PFO.  Mild (Grade I) mitral regurgitation.  IVC is dilated with respiratory variation.  No evidence of pulmonary hypertension.  EKG  02/18/2020: Normal sinus rhythm with rate of 81 bpm, left atrial abnormality, 3 beat run of atrial tachycardia.  No evidence of ischemia.   Compared to 11/06/2019, 3 beat atrial tachycardia new.  Assessment     ICD-10-CM   1. Coronary artery disease of native artery of native heart with stable angina pectoris ([place])  I25.118 PCV MYOCARDIAL PERFUSION WO LEXISCAN  2. Essential  hypertension  I10   3. Palpitations  R00.2   4. Dyspnea on exertion  R06.00 PCV MYOCARDIAL PERFUSION WO LEXISCAN  5. Mixed hyperlipidemia  E78.2 Lipid Panel With LDL/HDL Ratio    LDL cholesterol, direct     Recommendations:   Sharon Huffman  is a 59 y.o. female  With  family history of premature coronary artery disease in her father had CAD at 79 Y and died at 5 Years of age, tobacco use quit in Dec 2019, 15-20 pack year history, hypertension and hyperlipidemia, mild coronary artery disease by coronary CTA on 06/08/2019 with mid LAD 50% stenosis.    She has had recurrence of angina pectoris, recently over the past 2 months she started having anginal symptoms again with marked dyspnea with exertion activity, chest tightness, about 2 weeks ago had used 2 sublingual nitroglycerin with chest pain radiating to her jaw and/or back.  I am concerned about her symptoms, I will set her up for a exercise nuclear stress test to evaluate for LAD significance and progression of coronary artery disease.  With regard to palpitations and hypertension, blood pressure is well controlled on minimal dose of hydrochlorothiazide.  She is also tolerating bisoprolol without any side effects.  Continue the beta-blocker.  Symptoms of palpitations are remained stable.  She is presently on Crestor for hyperlipidemia, I may have to reevaluate her lipids as she has lost weight and she has made lifestyle changes and if lipids are not at goal, may have to consider changing therapy.  I would like to see him back in 4 to 6 weeks for follow-up.  Adrian Prows, MD, [facility] 05/20/2020, 3:44 PM [facility] [place] Pager: [phone] Office: [phone]

## 2020-05-20 ENCOUNTER — Other Ambulatory Visit: Payer: Self-pay

## 2020-05-20 ENCOUNTER — Ambulatory Visit: Payer: BC Managed Care – PPO | Admitting: Cardiology

## 2020-05-20 ENCOUNTER — Encounter: Payer: Self-pay | Admitting: Cardiology

## 2020-05-20 VITALS — BP 99/68 | HR 84 | Resp 16 | Ht 65.0 in | Wt 141.0 lb

## 2020-05-20 DIAGNOSIS — I10 Essential (primary) hypertension: Secondary | ICD-10-CM | POA: Diagnosis not present

## 2020-05-20 DIAGNOSIS — I25118 Atherosclerotic heart disease of native coronary artery with other forms of angina pectoris: Secondary | ICD-10-CM | POA: Diagnosis not present

## 2020-05-20 DIAGNOSIS — R0609 Other forms of dyspnea: Secondary | ICD-10-CM | POA: Diagnosis not present

## 2020-05-20 DIAGNOSIS — E782 Mixed hyperlipidemia: Secondary | ICD-10-CM

## 2020-05-20 DIAGNOSIS — R0600 Dyspnea, unspecified: Secondary | ICD-10-CM

## 2020-05-20 DIAGNOSIS — R002 Palpitations: Secondary | ICD-10-CM

## 2020-05-26 ENCOUNTER — Other Ambulatory Visit: Payer: Self-pay | Admitting: Cardiology

## 2020-05-26 DIAGNOSIS — L309 Dermatitis, unspecified: Secondary | ICD-10-CM | POA: Diagnosis not present

## 2020-05-26 DIAGNOSIS — F902 Attention-deficit hyperactivity disorder, combined type: Secondary | ICD-10-CM | POA: Diagnosis not present

## 2020-05-26 DIAGNOSIS — I10 Essential (primary) hypertension: Secondary | ICD-10-CM

## 2020-06-06 ENCOUNTER — Other Ambulatory Visit: Payer: BC Managed Care – PPO

## 2020-06-15 ENCOUNTER — Other Ambulatory Visit: Payer: Self-pay

## 2020-06-15 ENCOUNTER — Ambulatory Visit: Payer: BC Managed Care – PPO

## 2020-06-15 DIAGNOSIS — R0600 Dyspnea, unspecified: Secondary | ICD-10-CM

## 2020-06-15 DIAGNOSIS — R0609 Other forms of dyspnea: Secondary | ICD-10-CM | POA: Diagnosis not present

## 2020-06-15 DIAGNOSIS — I25118 Atherosclerotic heart disease of native coronary artery with other forms of angina pectoris: Secondary | ICD-10-CM

## 2020-06-16 NOTE — Telephone Encounter (Signed)
From patient.

## 2020-06-21 DIAGNOSIS — R358 Other polyuria: Secondary | ICD-10-CM | POA: Diagnosis not present

## 2020-06-29 ENCOUNTER — Other Ambulatory Visit: Payer: Self-pay | Admitting: Cardiology

## 2020-06-29 DIAGNOSIS — I10 Essential (primary) hypertension: Secondary | ICD-10-CM

## 2020-07-01 ENCOUNTER — Encounter: Payer: Self-pay | Admitting: Cardiology

## 2020-07-01 ENCOUNTER — Other Ambulatory Visit: Payer: Self-pay

## 2020-07-01 ENCOUNTER — Ambulatory Visit: Payer: BC Managed Care – PPO | Admitting: Cardiology

## 2020-07-01 VITALS — BP 105/67 | HR 83 | Resp 15 | Ht 65.0 in | Wt 140.0 lb

## 2020-07-01 DIAGNOSIS — R232 Flushing: Secondary | ICD-10-CM

## 2020-07-01 DIAGNOSIS — I10 Essential (primary) hypertension: Secondary | ICD-10-CM | POA: Diagnosis not present

## 2020-07-01 NOTE — Progress Notes (Signed)
Primary Physician/Referring:  Deland Pretty, MD  Patient ID: Sharon Huffman, female    DOB: Oct 17, 1961, 59 y.o.   MRN: [id number]  Chief Complaint  Patient presents with  . Follow-up    6 week  . Hypertension  . Palpitations   HPI:    Sharon Huffman  is a 59 y.o. female with  family history of premature coronary artery disease in her father had CAD at 80 Y and died at 61 Years of age, tobacco use quit in Dec 2019, 15-20 pack year history, hypertension and hyperlipidemia, mild coronary artery disease by coronary CTA on 06/08/2019 with mid LAD 50% stenosis.  She now presents for a 30-monthoffice visit for follow-up of chest pain with radiation to her jaw and/or back with exertional activity, underwent nuclear stress test on 06/15/2020 which was normal.  She states that she has not had any further episodes of chest pain.  She is extremely emotional today and states that her life has become miserable.  She continues to complain of severe flushing symptoms, sensation of heat on her face, profound sweating followed by hypothermia episodes.  She has recorded some of these events.  She is wondering if the symptoms will affect her heart.  She continues to have dizziness that is chronic.  No syncope.     Past Medical History:  Diagnosis Date  . ADHD   . Anxiety   . Bronchitis, chronic ([place]   . Cervical cancer ([facility]   . Depression   . Depression   . Hyperlipidemia   . Hypertension   . Insomnia disorder   . Lyme disease   . Migraine   . Ovarian cancer ([facility]   . Palpitations    Past Surgical History:  Procedure Laterality Date  . ABDOMINAL HYSTERECTOMY  1994  . ANTERIOR CERVICAL DECOMP/DISCECTOMY FUSION  2000  . BREAST ENHANCEMENT SURGERY Bilateral   . EYE SURGERY  2016   PVD   Family History  Problem Relation Age of Onset  . Dementia Mother   . Hypertension Mother   . Hyperlipidemia Mother   . Heart attack Father        137 . Heart disease Father     Social History   Tobacco  Use  . Smoking status: Former Smoker    Packs/day: 0.50    Years: 30.00    Pack years: 15.00    Types: Cigarettes    Start date: 12/18/1975    Quit date: 11/27/2018    Years since quitting: 1.5  . Smokeless tobacco: Never Used  Substance Use Topics  . Alcohol use: Not Currently    Alcohol/week: 7.0 standard drinks    Types: 7 Glasses of wine per week    Comment: Quit 2020.   Marital Status: Married  ROS  Review of Systems  Constitutional: Positive for malaise/fatigue.  Cardiovascular: Positive for dyspnea on exertion and palpitations (occasional). Negative for chest pain, leg swelling and syncope.  Skin: Positive for flushing.  Musculoskeletal: Positive for joint pain.  Gastrointestinal: Negative for diarrhea, heartburn and melena.  Neurological: Negative for dizziness and loss of balance.  Psychiatric/Behavioral: Negative for memory loss.  All other systems reviewed and are negative.  Objective  Blood pressure 105/67, pulse 83, resp. rate 15, height _0  (1.651 m), weight 140 lb (63.5 kg), SpO2 97 %.  Vitals with BMI 07/01/2020 05/20/2020 02/18/2020  Height _1  _2  _3   Weight 140 lbs 141 lbs 151 lbs 8 oz  BMI  23.3 23.46 25.21  Systolic 105 99 -  Diastolic 67 68 -  Pulse 83 84 -     Physical Exam Constitutional:      General: She is not in acute distress.    Appearance: She is well-developed.  Cardiovascular:     Rate and Rhythm: Normal rate and regular rhythm. Occasional extrasystoles are present.    Pulses: Intact distal pulses.     Heart sounds: No murmur heard.  No gallop.      Comments: No leg edema, no JVD.  Pulmonary:     Effort: Pulmonary effort is normal. No accessory muscle usage.     Breath sounds: Normal breath sounds.  Abdominal:     General: Bowel sounds are normal.     Palpations: Abdomen is soft.    Laboratory examination:   No results for input(s): NA, K, CL, CO2, GLUCOSE, BUN, CREATININE, CALCIUM, GFRNONAA, GFRAA in the last 8760  hours. CrCl cannot be calculated (Patient's most recent lab result is older than the maximum 21 days allowed.).  CMP Latest Ref Rng & Units 05/19/2019 09/15/2014  Glucose 65 - 99 mg/dL 409(O) 502(H)  BUN 6 - 24 mg/dL 15 9  Creatinine 6.15 - 1.00 mg/dL 4.88 4.57  Sodium 334 - 144 mmol/L 144 141  Potassium 3.5 - 5.2 mmol/L 4.2 3.6(L)  Chloride 96 - 106 mmol/L 102 97  CO2 20 - 29 mmol/L 27 29  Calcium 8.7 - 10.2 mg/dL 9.5 9.6   CBC Latest Ref Rng & Units 09/15/2014  WBC 4.0 - 10.5 K/uL 7.9  Hemoglobin 12.0 - 15.0 g/dL 48.3  Hematocrit 36 - 46 % 39.4  Platelets 150 - 400 K/uL 166   External labs:  09/25/2019: Cholesterol, total 184.000 M 07/21/2018 HDL 39.000 07/28/2019 Triglycerides 227.000 07/28/2019  Creatinine, Serum 0.940 MG/ 10/01/2019 Potassium 4.200 05/19/2019 ALT (SGPT) 23.000 IU/ 07/21/2018  TSH 1.740 09/25/2019  07/28/2019: Total 154, triglycerides 227, HDL 39, LDL 70.  Non-HDL cholesterol 115.  Serum glucose 15 mg, BUN 13, creatinine 0.8, eGFR 75 mL, potassium 3.9. CMP otherwise normal.  CBC normal.  03/09/2014: Normal CBC, CMP. Total cholesterol 254, triglycerides 208, HDL 51, LDL 161. Non-HDL cholesterol 20 3.  Medications and allergies   Allergies  Allergen Reactions  . Amlodipine Swelling and Other (See Comments)    Leg edema and fluid retention  . Bystolic [Nebivolol Hcl] Other (See Comments)    sweat and get clammy  . Sulfacetamide Nausea Only  . Erythromycin Nausea And Vomiting     Current Outpatient Medications  Medication Instructions  . acebutolol (SECTRAL) 200 mg, Oral, Every evening  . ADDERALL XR 25 MG 24 hr capsule 25 mg, Oral, BH-each morning  . ALPRAZolam (XANAX) 1 mg, Oral, 3 times daily  . Biotin 10 MG CAPS Oral, Daily  . Cholecalciferol (VITAMIN D-3) 25 MCG (1000 UT) CAPS Oral, Daily  . Cranberry-Cholecalciferol 4200-500 MG-UNIT CAPS 4,200 mg, Oral  . cyclobenzaprine (FLEXERIL) 10 mg, Oral, Daily at bedtime, As needed for pain  . famotidine (PEPCID)  20 MG tablet One after supper  . FLUoxetine (PROZAC) 40 mg, Oral, Daily  . hydrochlorothiazide (HYDRODIURIL) 12.5 MG tablet TAKE 1 TABLET BY MOUTH EVERY DAY  . ipratropium (ATROVENT) 0.06 % nasal spray 2 sprays, Each Nare, 3 times daily  . KLOR-CON M20 20 MEQ tablet 20 mEq, Oral, Daily  . levothyroxine (SYNTHROID) 50 mcg, Oral, Daily before breakfast  . nitroGLYCERIN (NITROSTAT) 0.4 mg, Sublingual, Every 5 min PRN  . pantoprazole (PROTONIX) 40 mg,  Oral, Daily, Take 30-60 min before first meal of the day  . rosuvastatin (CRESTOR) 10 mg, Oral, Daily  . spironolactone (ALDACTONE) 50 MG tablet TAKE 1 TABLET BY MOUTH EVERY DAY  . traMADol (ULTRAM) 50 mg, Oral, 2 times daily  . zolpidem (AMBIEN) 5 mg, Oral, At bedtime PRN   There are no discontinued medications.  Radiology:   Coronary CT angiogram 06/08/2019: Coronary calcium score 291.  Moderate long plaque in the proximal LAD, 25 to 49% stenosis, CT FFR plus.  Minimal plaque in other vessels.  Mild calcification of the aortic valve.  Cardiac Studies:   Treadmill Stress 04/05/2014: Indications: Screening for CAD. Conclusions: Negative for ischemia. Normal exercise tolerence. Symptoms: Dyspnea. THR met. Arrhythmia: None. Continue primary prevention.  The patient exercised according to the Bruce protocol, Total time recorded 8 Min. 2 sec.achieving a max heart rate of 147 which was 88% of MPHR for age and 9.2 METS of work. Baseline NIBP was 132/82. Peak NIBP was 170/74 MaxSysp was: 180 MaxDiasp was: 82. The baseline ECG showed NSR,Borderline LVH. No ischemia. During exercise there wasno ST-T changes of ischemia.  Echocardiogram 06/01/2019:  Normal LV systolic function with EF 56%. Left ventricle cavity is normal  in size. Mild concentric hypertrophy of the left ventricle. Normal global  wall motion. Normal diastolic filling pattern. Calculated EF 56%.  Aneurysmal interatrial septum with possible PFO.  Mild (Grade I) mitral regurgitation.  IVC  is dilated with respiratory variation.  No evidence of pulmonary hypertension.  Lexiscan/modified Bruce Sestamibi stress test 06/15/2020: Lexiscan/modified Bruce nuclear stress test performed using 1-day protocol. protocol. Stress EKG is non-diagnostic, as this is pharmacological stress test. In addition, stress EKG at 74% MPHR showed sinus tachycardia, no ST-T abnormalities.  Normal myocardial perfusion. Stress LVEF 60%. Low risk study.  EKG  02/18/2020: Normal sinus rhythm with rate of 81 bpm, left atrial abnormality, 3 beat run of atrial tachycardia.  No evidence of ischemia.   Compared to 11/06/2019, 3 beat atrial tachycardia new.  Assessment   No diagnosis found.   Recommendations:   Sharon Huffman  is a 59 y.o. female  With  family history of premature coronary artery disease in her father had CAD at 50 Y and died at 32 Years of age, tobacco use quit in Dec 2019, 15-20 pack year history, hypertension and hyperlipidemia, mild coronary artery disease by coronary CTA on 06/08/2019 with mid LAD 50% stenosis.    She now presents for a 25-monthoffice visit for follow-up of chest pain with radiation to her jaw and/or back with exertional activity, underwent nuclear stress test on 06/15/2020 which was normal.  She states that she has not had any further episodes of chest pain.  I been seeing him for recurrent episodes of chest pain, hypertension and hyperlipidemia and palpitation management. Her main complaint being rapid onset of generalized flushing, feeling hot in her head and also face with profuse sweating.  She also has felt very dizzy and has had episodes of feeling extremely cold at the same time.  She is also recorded some of these events on her phone where she was sweating doing minimal activity and was hypothermic while sweating.  She has also started to feel generalized fatigue, feels there is an impending doom.  I am beginning to wonder if this is a neuroendocrine disorder, also some  form of weight connective tissue disease.  From cardiac standpoint, she is stable, I cannot explain any of her symptoms of hot and cold, dizziness  with blood pressure being well controlled and no hypotension.  I will see her back in 3 months but would await her neuroendocrine evaluation by Dr. Chalmers Cater and she is also seeing rheumatology soon.    Adrian Prows, MD, [facility] 07/01/2020, 3:33 PM Office: [phone]   CC: Hassan Buckler, MD; Dr. Lahoma Rocker ([place]).

## 2020-07-07 DIAGNOSIS — R002 Palpitations: Secondary | ICD-10-CM | POA: Diagnosis not present

## 2020-07-07 DIAGNOSIS — R21 Rash and other nonspecific skin eruption: Secondary | ICD-10-CM | POA: Diagnosis not present

## 2020-07-07 DIAGNOSIS — R5382 Chronic fatigue, unspecified: Secondary | ICD-10-CM | POA: Diagnosis not present

## 2020-07-07 DIAGNOSIS — M2550 Pain in unspecified joint: Secondary | ICD-10-CM | POA: Diagnosis not present

## 2020-07-07 DIAGNOSIS — E039 Hypothyroidism, unspecified: Secondary | ICD-10-CM | POA: Diagnosis not present

## 2020-07-07 DIAGNOSIS — R232 Flushing: Secondary | ICD-10-CM | POA: Diagnosis not present

## 2020-07-11 DIAGNOSIS — R002 Palpitations: Secondary | ICD-10-CM | POA: Diagnosis not present

## 2020-07-11 DIAGNOSIS — R232 Flushing: Secondary | ICD-10-CM | POA: Diagnosis not present

## 2020-07-25 DIAGNOSIS — M4319 Spondylolisthesis, multiple sites in spine: Secondary | ICD-10-CM | POA: Diagnosis not present

## 2020-07-25 DIAGNOSIS — M19031 Primary osteoarthritis, right wrist: Secondary | ICD-10-CM | POA: Diagnosis not present

## 2020-07-25 DIAGNOSIS — M47816 Spondylosis without myelopathy or radiculopathy, lumbar region: Secondary | ICD-10-CM | POA: Diagnosis not present

## 2020-07-25 DIAGNOSIS — M19071 Primary osteoarthritis, right ankle and foot: Secondary | ICD-10-CM | POA: Diagnosis not present

## 2020-07-25 DIAGNOSIS — M19072 Primary osteoarthritis, left ankle and foot: Secondary | ICD-10-CM | POA: Diagnosis not present

## 2020-07-25 DIAGNOSIS — M79671 Pain in right foot: Secondary | ICD-10-CM | POA: Diagnosis not present

## 2020-07-25 DIAGNOSIS — I700 Atherosclerosis of aorta: Secondary | ICD-10-CM | POA: Diagnosis not present

## 2020-07-25 DIAGNOSIS — M79672 Pain in left foot: Secondary | ICD-10-CM | POA: Diagnosis not present

## 2020-07-25 DIAGNOSIS — M79642 Pain in left hand: Secondary | ICD-10-CM | POA: Diagnosis not present

## 2020-07-25 DIAGNOSIS — M79643 Pain in unspecified hand: Secondary | ICD-10-CM | POA: Diagnosis not present

## 2020-07-25 DIAGNOSIS — M2550 Pain in unspecified joint: Secondary | ICD-10-CM | POA: Diagnosis not present

## 2020-07-25 DIAGNOSIS — M533 Sacrococcygeal disorders, not elsewhere classified: Secondary | ICD-10-CM | POA: Diagnosis not present

## 2020-07-25 DIAGNOSIS — M19042 Primary osteoarthritis, left hand: Secondary | ICD-10-CM | POA: Diagnosis not present

## 2020-07-25 DIAGNOSIS — M791 Myalgia, unspecified site: Secondary | ICD-10-CM | POA: Diagnosis not present

## 2020-07-25 DIAGNOSIS — R5383 Other fatigue: Secondary | ICD-10-CM | POA: Diagnosis not present

## 2020-07-25 DIAGNOSIS — M546 Pain in thoracic spine: Secondary | ICD-10-CM | POA: Diagnosis not present

## 2020-07-25 DIAGNOSIS — M5137 Other intervertebral disc degeneration, lumbosacral region: Secondary | ICD-10-CM | POA: Diagnosis not present

## 2020-07-25 DIAGNOSIS — M79641 Pain in right hand: Secondary | ICD-10-CM | POA: Diagnosis not present

## 2020-07-25 DIAGNOSIS — M19041 Primary osteoarthritis, right hand: Secondary | ICD-10-CM | POA: Diagnosis not present

## 2020-07-31 ENCOUNTER — Other Ambulatory Visit: Payer: Self-pay | Admitting: Cardiology

## 2020-07-31 DIAGNOSIS — I10 Essential (primary) hypertension: Secondary | ICD-10-CM

## 2020-08-01 DIAGNOSIS — E875 Hyperkalemia: Secondary | ICD-10-CM | POA: Diagnosis not present

## 2020-08-01 DIAGNOSIS — F3341 Major depressive disorder, recurrent, in partial remission: Secondary | ICD-10-CM | POA: Diagnosis not present

## 2020-08-01 DIAGNOSIS — E785 Hyperlipidemia, unspecified: Secondary | ICD-10-CM | POA: Diagnosis not present

## 2020-08-01 DIAGNOSIS — I700 Atherosclerosis of aorta: Secondary | ICD-10-CM | POA: Diagnosis not present

## 2020-08-01 DIAGNOSIS — Z0001 Encounter for general adult medical examination with abnormal findings: Secondary | ICD-10-CM | POA: Diagnosis not present

## 2020-08-01 DIAGNOSIS — N1831 Chronic kidney disease, stage 3a: Secondary | ICD-10-CM | POA: Diagnosis not present

## 2020-08-01 DIAGNOSIS — I10 Essential (primary) hypertension: Secondary | ICD-10-CM | POA: Diagnosis not present

## 2020-08-12 ENCOUNTER — Other Ambulatory Visit: Payer: Self-pay | Admitting: Internal Medicine

## 2020-08-12 ENCOUNTER — Other Ambulatory Visit (HOSPITAL_COMMUNITY): Payer: Self-pay | Admitting: Internal Medicine

## 2020-08-12 DIAGNOSIS — N63 Unspecified lump in unspecified breast: Secondary | ICD-10-CM

## 2020-08-12 DIAGNOSIS — D447 Neoplasm of uncertain behavior of aortic body and other paraganglia: Secondary | ICD-10-CM

## 2020-08-15 ENCOUNTER — Other Ambulatory Visit (HOSPITAL_COMMUNITY): Payer: Self-pay | Admitting: Internal Medicine

## 2020-08-15 DIAGNOSIS — D447 Neoplasm of uncertain behavior of aortic body and other paraganglia: Secondary | ICD-10-CM

## 2020-08-19 ENCOUNTER — Encounter (HOSPITAL_COMMUNITY): Payer: Self-pay

## 2020-08-19 ENCOUNTER — Other Ambulatory Visit (HOSPITAL_COMMUNITY): Payer: BC Managed Care – PPO

## 2020-08-31 ENCOUNTER — Other Ambulatory Visit: Payer: Self-pay | Admitting: Internal Medicine

## 2020-08-31 ENCOUNTER — Ambulatory Visit
Admission: RE | Admit: 2020-08-31 | Discharge: 2020-08-31 | Disposition: A | Discharge status: Home / Self Care | Payer: BC Managed Care – PPO | Source: Ambulatory Visit | Attending: Internal Medicine | Admitting: Internal Medicine

## 2020-08-31 ENCOUNTER — Other Ambulatory Visit: Payer: Self-pay

## 2020-08-31 DIAGNOSIS — N63 Unspecified lump in unspecified breast: Secondary | ICD-10-CM

## 2020-08-31 DIAGNOSIS — R928 Other abnormal and inconclusive findings on diagnostic imaging of breast: Secondary | ICD-10-CM | POA: Diagnosis not present

## 2020-08-31 DIAGNOSIS — N6320 Unspecified lump in the left breast, unspecified quadrant: Secondary | ICD-10-CM

## 2020-08-31 DIAGNOSIS — N6489 Other specified disorders of breast: Secondary | ICD-10-CM | POA: Diagnosis not present

## 2020-09-06 DIAGNOSIS — Z780 Asymptomatic menopausal state: Secondary | ICD-10-CM | POA: Diagnosis not present

## 2020-09-06 DIAGNOSIS — E559 Vitamin D deficiency, unspecified: Secondary | ICD-10-CM | POA: Diagnosis not present

## 2020-09-06 DIAGNOSIS — E875 Hyperkalemia: Secondary | ICD-10-CM | POA: Diagnosis not present

## 2020-09-06 DIAGNOSIS — Z13820 Encounter for screening for osteoporosis: Secondary | ICD-10-CM | POA: Diagnosis not present

## 2020-09-06 DIAGNOSIS — I10 Essential (primary) hypertension: Secondary | ICD-10-CM | POA: Diagnosis not present

## 2020-09-07 DIAGNOSIS — L308 Other specified dermatitis: Secondary | ICD-10-CM | POA: Diagnosis not present

## 2020-09-12 ENCOUNTER — Other Ambulatory Visit: Payer: Self-pay

## 2020-09-12 ENCOUNTER — Ambulatory Visit (HOSPITAL_COMMUNITY)
Admission: RE | Admit: 2020-09-12 | Discharge: 2020-09-12 | Disposition: A | Discharge status: Home / Self Care | Payer: BC Managed Care – PPO | Source: Ambulatory Visit | Attending: Internal Medicine | Admitting: Internal Medicine

## 2020-09-12 DIAGNOSIS — D447 Neoplasm of uncertain behavior of aortic body and other paraganglia: Secondary | ICD-10-CM | POA: Diagnosis not present

## 2020-09-12 DIAGNOSIS — I700 Atherosclerosis of aorta: Secondary | ICD-10-CM | POA: Diagnosis not present

## 2020-09-12 MED ORDER — COPPER CU 64 DOTATATE 1 MCI/ML IV SOLN
4.0000 | Freq: Once | INTRAVENOUS | Status: AC
  Administered 2020-09-12: 3.8 via INTRAVENOUS

## 2020-09-20 DIAGNOSIS — M79643 Pain in unspecified hand: Secondary | ICD-10-CM | POA: Diagnosis not present

## 2020-09-20 DIAGNOSIS — M2550 Pain in unspecified joint: Secondary | ICD-10-CM | POA: Diagnosis not present

## 2020-09-20 DIAGNOSIS — R5383 Other fatigue: Secondary | ICD-10-CM | POA: Diagnosis not present

## 2020-09-20 DIAGNOSIS — M791 Myalgia, unspecified site: Secondary | ICD-10-CM | POA: Diagnosis not present

## 2020-10-03 ENCOUNTER — Ambulatory Visit: Payer: BC Managed Care – PPO | Admitting: Cardiology

## 2020-10-06 ENCOUNTER — Encounter: Payer: Self-pay | Admitting: Cardiology

## 2020-10-06 ENCOUNTER — Ambulatory Visit: Payer: BC Managed Care – PPO | Admitting: Cardiology

## 2020-10-06 ENCOUNTER — Other Ambulatory Visit: Payer: Self-pay

## 2020-10-06 VITALS — BP 110/65 | HR 85 | Resp 16 | Ht 65.0 in | Wt 143.0 lb

## 2020-10-06 DIAGNOSIS — I25118 Atherosclerotic heart disease of native coronary artery with other forms of angina pectoris: Secondary | ICD-10-CM | POA: Diagnosis not present

## 2020-10-06 DIAGNOSIS — R42 Dizziness and giddiness: Secondary | ICD-10-CM | POA: Diagnosis not present

## 2020-10-06 DIAGNOSIS — R55 Syncope and collapse: Secondary | ICD-10-CM | POA: Diagnosis not present

## 2020-10-06 NOTE — Progress Notes (Signed)
Primary Physician/Referring:  Deland Pretty, MD  Patient ID: Sharon Huffman, female    DOB: [birth year], 59 y.o.   MRN: [id number]  Chief Complaint  Patient presents with  . Follow-up    3 month  . Hypertension   HPI:    Sharon Huffman  is a 59 y.o. female with  family history of premature coronary artery disease in her father had CAD at 85 Y and died at 28 Years of age, tobacco use quit in Dec 2019, 15-20 pack year history, hypertension and hyperlipidemia, mild coronary artery disease by coronary CTA on 06/08/2019 with mid LAD 50% stenosis.  She now presents for a 63-monthoffice visit for follow-up of chest pain with radiation to her jaw and/or back with exertional activity, underwent nuclear stress test on 06/15/2020 which was normal.  She states that she has not had any further episodes of chest pain.  She is extremely emotional today and states that her life has become miserable.  She continues to complain of severe flushing symptoms, sensation of heat on her face, profound sweating followed by hypothermia episodes.  She has recorded some of these events.  She is wondering if the symptoms will affect her heart.  She continues to have dizziness that is chronic.  No syncope.     Past Medical History:  Diagnosis Date  . ADHD   . Anxiety   . Bronchitis, chronic ([facility]   . [place] ([facility]   . Depression   . Depression   . Hyperlipidemia   . Hypertension   . Insomnia disorder   . Lyme disease   . Migraine   . [place] ([place]   . Palpitations    Past Surgical History:  Procedure Laterality Date  . ABDOMINAL HYSTERECTOMY  1994  . ANTERIOR CERVICAL DECOMP/DISCECTOMY FUSION  2000  . AUGMENTATION MAMMAPLASTY Bilateral 1998  . BREAST ENHANCEMENT SURGERY Bilateral   . EYE SURGERY  2016   PVD   Family History  Problem Relation Age of Onset  . Dementia Mother   . Hypertension Mother   . Hyperlipidemia Mother   . Heart attack Father        168 . Heart disease Father      Social History   Tobacco Use  . Smoking status: Former Smoker    Packs/day: 0.50    Years: 30.00    Pack years: 15.00    Types: Cigarettes    Start date: 12/18/1975    Quit date: 11/27/2018    Years since quitting: 1.8  . Smokeless tobacco: Never Used  Substance Use Topics  . Alcohol use: Not Currently    Alcohol/week: 7.0 standard drinks    Types: 7 Glasses of wine per week    Comment: Quit 2020.   Marital Status: Married  ROS  Review of Systems  Constitutional: Positive for malaise/fatigue.  Cardiovascular: Positive for dyspnea on exertion and palpitations (occasional). Negative for chest pain, leg swelling and syncope.  Skin: Positive for flushing.  Musculoskeletal: Positive for joint pain.  Gastrointestinal: Negative for diarrhea, heartburn and melena.  Neurological: Negative for dizziness and loss of balance.  Psychiatric/Behavioral: Negative for memory loss.  All other systems reviewed and are negative.  Objective  Blood pressure 110/65, pulse 85, resp. rate 16, height 5' 5" (1.651 m), weight 143 lb (64.9 kg), SpO2 98 %.  Vitals with BMI 10/06/2020 10/06/2020 07/01/2020  Height 5' 5" 5' 5" 5' 5"  Weight 143 lbs 143 lbs 140 lbs  BMI 23.8 54.6 56.8  Systolic - 127 517  Diastolic - 65 67  Pulse - 85 83     Physical Exam Constitutional:      General: She is not in acute distress.    Appearance: She is well-developed.  Cardiovascular:     Rate and Rhythm: Normal rate and regular rhythm. Occasional extrasystoles are present.    Pulses: Intact distal pulses.     Heart sounds: No murmur heard.  No gallop.      Comments: No leg edema, no JVD.  Pulmonary:     Effort: Pulmonary effort is normal. No accessory muscle usage.     Breath sounds: Normal breath sounds.  Abdominal:     General: Bowel sounds are normal.     Palpations: Abdomen is soft.    Laboratory examination:   No results for input(s): NA, K, CL, CO2, GLUCOSE, BUN, CREATININE, CALCIUM, GFRNONAA, GFRAA  in the last 8760 hours. CrCl cannot be calculated (Patient's most recent lab result is older than the maximum 21 days allowed.).  CMP Latest Ref Rng & Units 05/19/2019 09/15/2014  Glucose 65 - 99 mg/dL 114(H) 114(H)  BUN 6 - 24 mg/dL 15 9  Creatinine 0.57 - 1.00 mg/dL 0.78 0.70  Sodium 134 - 144 mmol/L 144 141  Potassium 3.5 - 5.2 mmol/L 4.2 3.6(L)  Chloride 96 - 106 mmol/L 102 97  CO2 20 - 29 mmol/L 27 29  Calcium 8.7 - 10.2 mg/dL 9.5 9.6   CBC Latest Ref Rng & Units 09/15/2014  WBC 4.0 - 10.5 K/uL 7.9  Hemoglobin 12.0 - 15.0 g/dL 13.3  Hematocrit 36 - 46 % 39.4  Platelets 150 - 400 K/uL 166   External labs:  09/25/2019: Cholesterol, total 184.000 M 07/21/2018 HDL 39.000 07/28/2019 Triglycerides 227.000 07/28/2019  Creatinine, Serum 0.940 MG/ 10/01/2019 Potassium 4.200 05/19/2019 ALT (SGPT) 23.000 IU/ 07/21/2018  TSH 1.740 09/25/2019  07/28/2019: Total 154, triglycerides 227, HDL 39, LDL 70.  Non-HDL cholesterol 115.  Serum glucose 15 mg, BUN 13, creatinine 0.8, eGFR 75 mL, potassium 3.9. CMP otherwise normal.  CBC normal.  03/09/2014: Normal CBC, CMP. Total cholesterol 254, triglycerides 208, HDL 51, LDL 161. Non-HDL cholesterol 20 3.  Medications and allergies   Allergies  Allergen Reactions  . Amlodipine Swelling and Other (See Comments)    Leg edema and fluid retention  . Bystolic [Nebivolol Hcl] Other (See Comments)    sweat and get clammy  . Sulfacetamide Nausea Only  . Erythromycin Nausea And Vomiting     Current Outpatient Medications  Medication Instructions  . acebutolol (SECTRAL) 200 mg, Oral, Every evening  . ADDERALL XR 25 MG 24 hr capsule 25 mg, Oral, BH-each morning  . ALPRAZolam (XANAX) 1 mg, Oral, 3 times daily  . Biotin 10 MG CAPS Oral, Daily  . Cholecalciferol (VITAMIN D-3) 25 MCG (1000 UT) CAPS Oral, Daily  . Cranberry-Cholecalciferol 4200-500 MG-UNIT CAPS 4,200 mg, Oral  . cyclobenzaprine (FLEXERIL) 10 mg, Oral, Daily at bedtime, As needed for pain  .  famotidine (PEPCID) 20 MG tablet One after supper  . FLUoxetine (PROZAC) 40 mg, Oral, Daily  . hydrochlorothiazide (HYDRODIURIL) 12.5 MG tablet TAKE 1 TABLET BY MOUTH EVERY DAY  . ipratropium (ATROVENT) 0.06 % nasal spray 2 sprays, Each Nare, 3 times daily  . levothyroxine (SYNTHROID) 50 mcg, Oral, Daily before breakfast  . midodrine (PROAMATINE) 5 mg, Oral, 3 times daily with meals, While awake. Do not lay down for 3 hours after last dose  . nitroGLYCERIN (  NITROSTAT) 0.4 mg, Sublingual, Every 5 min PRN  . pantoprazole (PROTONIX) 40 mg, Oral, Daily, Take 30-60 min before first meal of the day  . rosuvastatin (CRESTOR) 10 mg, Oral, Daily  . spironolactone (ALDACTONE) 50 MG tablet TAKE 1 TABLET BY MOUTH EVERY DAY  . traMADol (ULTRAM) 50 mg, Oral, 2 times daily  . vitamin B-12 (CYANOCOBALAMIN) 1,000 mcg, Oral, Daily, Pt takes up to 4 daily.   Marland Kitchen zolpidem (AMBIEN) 5 mg, Oral, At bedtime PRN   Medications Discontinued During This Encounter  Medication Reason  . KLOR-CON M20 20 MEQ tablet No longer needed (for PRN medications)  . gadopentetate dimeglumine (MAGNEVIST) injection 18 mL     Radiology:   Coronary CT angiogram 06/08/2019: Coronary calcium score 291.  Moderate long plaque in the proximal LAD, 25 to 49% stenosis, CT FFR plus.  Minimal plaque in other vessels.  Mild calcification of the aortic valve.  Cardiac Studies:   Treadmill Stress 04/05/2014: Indications: Screening for CAD. Conclusions: Negative for ischemia. Normal exercise tolerence. Symptoms: Dyspnea. THR met. Arrhythmia: None. Continue primary prevention.  The patient exercised according to the Bruce protocol, Total time recorded 8 Min. 2 sec.achieving a max heart rate of 147 which was 88% of MPHR for age and 9.2 METS of work. Baseline NIBP was 132/82. Peak NIBP was 170/74 MaxSysp was: 180 MaxDiasp was: 82. The baseline ECG showed NSR,Borderline LVH. No ischemia. During exercise there wasno ST-T changes of  ischemia.  Echocardiogram 06/01/2019:  Normal LV systolic function with EF 56%. Left ventricle cavity is normal  in size. Mild concentric hypertrophy of the left ventricle. Normal global  wall motion. Normal diastolic filling pattern. Calculated EF 56%.  Aneurysmal interatrial septum with possible PFO.  Mild (Grade I) mitral regurgitation.  IVC is dilated with respiratory variation.  No evidence of pulmonary hypertension.  Lexiscan/modified Bruce Sestamibi stress test 06/15/2020: Lexiscan/modified Bruce nuclear stress test performed using 1-day protocol. protocol. Stress EKG is non-diagnostic, as this is pharmacological stress test. In addition, stress EKG at 74% MPHR showed sinus tachycardia, no ST-T abnormalities.  Normal myocardial perfusion. Stress LVEF 60%. Low risk study.  EKG  02/18/2020: Normal sinus rhythm with rate of 81 bpm, left atrial abnormality, 3 beat run of atrial tachycardia.  No evidence of ischemia.   Compared to 11/06/2019, 3 beat atrial tachycardia new.  Assessment     ICD-10-CM   1. Vasodepressor syncope  R55 midodrine (PROAMATINE) 5 MG tablet  2. Dizziness  R42   3. Coronary artery disease of native artery of native heart with stable angina pectoris [facility])  I25.118      Recommendations:   Sharon Huffman  is a 59 y.o. female  With  family history of premature coronary artery disease in her father had CAD at 34 Y and died at 30 Years of age, tobacco use quit in Dec 2019, 15-20 pack year history, hypertension and hyperlipidemia, mild coronary artery disease by coronary CTA on 06/08/2019 with mid LAD 50% stenosis.     Patient continues to have episodes of marked sweating, dizziness brings in blood pressure recordings, she does drop her blood pressure with no change in heart rate.  I suspect that she probably has vasodepressor syncope. I would like to try midodrine 5 mg p.o. 3 times daily.  She has had extensive evaluation by rheumatology and also for neuroendocrine  tumor and all have been negative.   I will see her back in 3 months. Her neuroendocrine evaluation by Dr. Chalmers Cater and  Rheumatology Dr. Lahoma Rocker. Will keep them in the loop.    Adrian Prows, MD, [facility] 10/10/2020, 6:25 PM Office: [phone]   CC: Hassan Buckler, MD; Dr. Lahoma Rocker (Rheu).

## 2020-10-10 MED ORDER — MIDODRINE HCL 5 MG PO TABS: 5.0000 mg | ORAL_TABLET | Freq: Three times a day (TID) | ORAL | 2 refills | Status: DC

## 2020-10-12 DIAGNOSIS — Z23 Encounter for immunization: Secondary | ICD-10-CM | POA: Diagnosis not present

## 2020-10-12 DIAGNOSIS — E039 Hypothyroidism, unspecified: Secondary | ICD-10-CM | POA: Diagnosis not present

## 2020-10-26 DIAGNOSIS — F902 Attention-deficit hyperactivity disorder, combined type: Secondary | ICD-10-CM | POA: Diagnosis not present

## 2020-10-26 DIAGNOSIS — E875 Hyperkalemia: Secondary | ICD-10-CM | POA: Diagnosis not present

## 2020-11-02 ENCOUNTER — Other Ambulatory Visit: Payer: Self-pay | Admitting: Cardiology

## 2020-11-02 DIAGNOSIS — R002 Palpitations: Secondary | ICD-10-CM

## 2020-11-02 DIAGNOSIS — I10 Essential (primary) hypertension: Secondary | ICD-10-CM

## 2020-11-03 DIAGNOSIS — E875 Hyperkalemia: Secondary | ICD-10-CM | POA: Diagnosis not present

## 2020-11-03 DIAGNOSIS — N189 Chronic kidney disease, unspecified: Secondary | ICD-10-CM | POA: Diagnosis not present

## 2020-11-16 DIAGNOSIS — N183 Chronic kidney disease, stage 3 unspecified: Secondary | ICD-10-CM | POA: Diagnosis not present

## 2020-11-16 DIAGNOSIS — I129 Hypertensive chronic kidney disease with stage 1 through stage 4 chronic kidney disease, or unspecified chronic kidney disease: Secondary | ICD-10-CM | POA: Diagnosis not present

## 2020-11-16 DIAGNOSIS — E559 Vitamin D deficiency, unspecified: Secondary | ICD-10-CM | POA: Diagnosis not present

## 2020-11-16 DIAGNOSIS — I959 Hypotension, unspecified: Secondary | ICD-10-CM | POA: Diagnosis not present

## 2020-11-16 DIAGNOSIS — E875 Hyperkalemia: Secondary | ICD-10-CM | POA: Diagnosis not present

## 2020-11-18 DIAGNOSIS — I10 Essential (primary) hypertension: Secondary | ICD-10-CM

## 2020-11-18 DIAGNOSIS — R600 Localized edema: Secondary | ICD-10-CM

## 2020-11-18 MED ORDER — SPIRONOLACTONE 50 MG PO TABS: 25.0000 mg | ORAL_TABLET | Freq: Every day | ORAL | 1 refills | Status: DC

## 2020-11-18 MED ORDER — FUROSEMIDE 20 MG PO TABS: 20.0000 mg | ORAL_TABLET | Freq: Every day | ORAL | 3 refills | Status: DC | PRN

## 2020-11-18 NOTE — Telephone Encounter (Signed)
From pt

## 2020-11-18 NOTE — Telephone Encounter (Signed)
Patient had developed worsening renal function, hence HCTZ has been discontinued by nephrology.  Spironolactone has been reduced from 50 mg to 25 mg daily.  She was advised to take Lasix on a as needed basis for leg edema.  I will refill the prescription.  Discussed with the patient today verbally.    ICD-10-CM   1. Bilateral leg edema  R60.0 furosemide (LASIX) 20 MG tablet  2. Essential hypertension  I10 spironolactone (ALDACTONE) 50 MG tablet    Medications Discontinued During This Encounter  Medication Reason  . hydrochlorothiazide (HYDRODIURIL) 12.5 MG tablet Discontinued by provider  . spironolactone (ALDACTONE) 50 MG tablet     Meds ordered this encounter  Medications  . spironolactone (ALDACTONE) 50 MG tablet    Sig: Take 0.5 tablets (25 mg total) by mouth daily.    Dispense:  90 tablet    Refill:  1  . furosemide (LASIX) 20 MG tablet    Sig: Take 1 tablet (20 mg total) by mouth daily as needed for edema.    Dispense:  90 tablet    Refill:  3    Adrian Prows, MD, [facility] 11/18/2020, 4:36 PM Office: [phone] Pager: [phone]

## 2020-11-22 NOTE — Telephone Encounter (Signed)
From pt

## 2020-12-07 ENCOUNTER — Telehealth: Payer: Self-pay | Admitting: Cardiology

## 2020-12-07 NOTE — Telephone Encounter (Signed)
While speaking with pt to reschedule her appt, she stated she has restarted her Spirolactone and is experiencing swelling and puffiness.  She has made an appointment to see Lawerance Cruel 12/27

## 2020-12-11 NOTE — Progress Notes (Deleted)
Primary Physician/Referring:  Deland Pretty, MD  Patient ID: Sharon Huffman, female    DOB: 11/03/1961, 59 y.o.   MRN: [id number]  No chief complaint on file.  HPI:    VIVION ROMANO  is a 59 y.o. female with  family history of premature coronary artery disease in her father had CAD at 24 Y and died at 49 Years of age, tobacco use quit in Dec 2019, 15-20 pack year history, hypertension and hyperlipidemia, mild coronary artery disease by coronary CTA on 06/08/2019 with mid LAD 50% stenosis.  She now presents for a 40-monthoffice visit for follow-up of chest pain with radiation to her jaw and/or back with exertional activity, underwent nuclear stress test on 06/15/2020 which was normal.  She states that she has not had any further episodes of chest pain.  She is extremely emotional today and states that her life has become miserable.  She continues to complain of severe flushing symptoms, sensation of heat on her face, profound sweating followed by hypothermia episodes.  She has recorded some of these events.  She is wondering if the symptoms will affect her heart.  She continues to have dizziness that is chronic.  No syncope.     ***Patient presents for 3 month follow up. At last visit started midodrine 5 mg 3 times daily for vasodepressor syncope.   Past Medical History:  Diagnosis Date  . ADHD   . Anxiety   . [place]chitis, chronic ([place]   . [place]ervical cancer ([place]   . Depression   . Depression   . Hyperlipidemia   . Hypertension   . Insomnia disorder   . Lyme disease   . Migraine   . Ovarian cancer ([facility]   . Palpitations    Past Surgical History:  Procedure Laterality Date  . ABDOMINAL HYSTERECTOMY  1994  . ANTERIOR CERVICAL DECOMP/DISCECTOMY FUSION  2000  . AUGMENTATION MAMMAPLASTY Bilateral 1998  . BREAST ENHANCEMENT SURGERY Bilateral   . EYE SURGERY  2016   PVD   Family History  Problem Relation Age of Onset  . Dementia Mother   . Hypertension Mother   . Hyperlipidemia Mother    . Heart attack Father        129 . Heart disease Father     Social History   Tobacco Use  . Smoking status: Former Smoker    Packs/day: 0.50    Years: 30.00    Pack years: 15.00    Types: Cigarettes    Start date: 12/18/1975    Quit date: 11/27/2018    Years since quitting: 2.0  . Smokeless tobacco: Never Used  Substance Use Topics  . Alcohol use: Not Currently    Alcohol/week: 7.0 standard drinks    Types: 7 Glasses of wine per week    Comment: Quit 2020.   Marital Status: Married  ROS  Review of Systems  Constitutional: Positive for malaise/fatigue.  Cardiovascular: Positive for dyspnea on exertion and palpitations (occasional). Negative for chest pain, leg swelling and syncope.  Skin: Positive for flushing.  Musculoskeletal: Positive for joint pain.  Gastrointestinal: Negative for diarrhea, heartburn and melena.  Neurological: Negative for dizziness and loss of balance.  Psychiatric/Behavioral: Negative for memory loss.  All other systems reviewed and are negative.  Objective  There were no vitals taken for this visit.  Vitals with BMI 10/06/2020 10/06/2020 07/01/2020  Height 5' 5"  5' 5"  5' 5"   Weight 143 lbs 143 lbs 140 lbs  BMI 23.8 23.8 23.3  Systolic - 458 099  Diastolic - 65 67  Pulse - 85 83     Physical Exam Constitutional:      General: She is not in acute distress.    Appearance: She is well-developed.  Cardiovascular:     Rate and Rhythm: Normal rate and regular rhythm. Occasional extrasystoles are present.    Pulses: Intact distal pulses.     Heart sounds: No murmur heard. No gallop.      Comments: No leg edema, no JVD.  Pulmonary:     Effort: Pulmonary effort is normal. No accessory muscle usage.     Breath sounds: Normal breath sounds.  Abdominal:     General: Bowel sounds are normal.     Palpations: Abdomen is soft.    Laboratory examination:   No results for input(s): NA, K, CL, CO2, GLUCOSE, BUN, CREATININE, CALCIUM, GFRNONAA, GFRAA  in the last 8760 hours. CrCl cannot be calculated (Patient's most recent lab result is older than the maximum 21 days allowed.).  CMP Latest Ref Rng & Units 05/19/2019 09/15/2014  Glucose 65 - 99 mg/dL 114(H) 114(H)  BUN 6 - 24 mg/dL 15 9  Creatinine 0.57 - 1.00 mg/dL 0.78 0.70  Sodium 134 - 144 mmol/L 144 141  Potassium 3.5 - 5.2 mmol/L 4.2 3.6(L)  Chloride 96 - 106 mmol/L 102 97  CO2 20 - 29 mmol/L 27 29  Calcium 8.7 - 10.2 mg/dL 9.5 9.6   CBC Latest Ref Rng & Units 09/15/2014  WBC 4.0 - 10.5 K/uL 7.9  Hemoglobin 12.0 - 15.0 g/dL 13.3  Hematocrit 36.0 - 46.0 % 39.4  Platelets 150 - 400 K/uL 166   External labs:   08/01/2020: HDL 56, LDL 78, triglycerides 226  09/25/2019: Cholesterol, total 184.000 M 07/21/2018 HDL 39.000 07/28/2019 Triglycerides 227.000 07/28/2019  Creatinine, Serum 0.940 MG/ 10/01/2019 Potassium 4.200 05/19/2019 ALT (SGPT) 23.000 IU/ 07/21/2018  TSH 1.740 09/25/2019  07/28/2019: Total 154, triglycerides 227, HDL 39, LDL 70.  Non-HDL cholesterol 115.  Serum glucose 15 mg, BUN 13, creatinine 0.8, eGFR 75 mL, potassium 3.9. CMP otherwise normal.  CBC normal.  03/09/2014: Normal CBC, CMP. Total cholesterol 254, triglycerides 208, HDL 51, LDL 161. Non-HDL cholesterol 20 3.  Medications and allergies   Allergies  Allergen Reactions  . Amlodipine Swelling and Other (See Comments)    Leg edema and fluid retention  . Bystolic [Nebivolol Hcl] Other (See Comments)    sweat and get clammy  . Sulfacetamide Nausea Only  . Erythromycin Nausea And Vomiting     Current Outpatient Medications  Medication Instructions  . acebutolol (SECTRAL) 200 mg, Oral, Every evening  . ADDERALL XR 25 MG 24 hr capsule 25 mg, Oral, BH-each morning  . ALPRAZolam (XANAX) 1 mg, Oral, 3 times daily  . Biotin 10 MG CAPS Oral, Daily  . Cholecalciferol (VITAMIN D-3) 25 MCG (1000 UT) CAPS Oral, Daily  . Cranberry-Cholecalciferol 4200-500 MG-UNIT CAPS 4,200 mg, Oral  . cyclobenzaprine (FLEXERIL) 10  mg, Oral, Daily at bedtime, As needed for pain  . famotidine (PEPCID) 20 MG tablet One after supper  . FLUoxetine (PROZAC) 40 mg, Oral, Daily  . furosemide (LASIX) 20 mg, Oral, Daily PRN  . ipratropium (ATROVENT) 0.06 % nasal spray 2 sprays, Each Nare, 3 times daily  . levothyroxine (SYNTHROID) 50 mcg, Oral, Daily before breakfast  . midodrine (PROAMATINE) 5 mg, Oral, 3 times daily with meals, While awake. Do not lay down for 3 hours after last dose  . nitroGLYCERIN (NITROSTAT) 0.4  mg, Sublingual, Every 5 min PRN  . pantoprazole (PROTONIX) 40 mg, Oral, Daily, Take 30-60 min before first meal of the day  . rosuvastatin (CRESTOR) 10 mg, Oral, Daily  . traMADol (ULTRAM) 50 mg, Oral, 2 times daily  . vitamin B-12 (CYANOCOBALAMIN) 1,000 mcg, Oral, Daily, Pt takes up to 4 daily.   Marland Kitchen zolpidem (AMBIEN) 5 mg, Oral, At bedtime PRN   There are no discontinued medications.  Radiology:   Coronary CT angiogram 06/08/2019: Coronary calcium score 291.  Moderate long plaque in the proximal LAD, 25 to 49% stenosis, CT FFR plus.  Minimal plaque in other vessels.  Mild calcification of the aortic valve.  Cardiac Studies:   Treadmill Stress 04/05/2014: Indications: Screening for CAD. Conclusions: Negative for ischemia. Normal exercise tolerence. Symptoms: Dyspnea. THR met. Arrhythmia: None. Continue primary prevention.  The patient exercised according to the Bruce protocol, Total time recorded 8 Min. 2 sec.achieving a max heart rate of 147 which was 88% of MPHR for age and 9.2 METS of work. Baseline NIBP was 132/82. Peak NIBP was 170/74 MaxSysp was: 180 MaxDiasp was: 82. The baseline ECG showed NSR,Borderline LVH. No ischemia. During exercise there wasno ST-T changes of ischemia.  Echocardiogram 06/01/2019:  Normal LV systolic function with EF 56%. Left ventricle cavity is normal  in size. Mild concentric hypertrophy of the left ventricle. Normal global  wall motion. Normal diastolic filling pattern.  Calculated EF 56%.  Aneurysmal interatrial septum with possible PFO.  Mild (Grade I) mitral regurgitation.  IVC is dilated with respiratory variation.  No evidence of pulmonary hypertension.  Lexiscan/modified Bruce Sestamibi stress test 06/15/2020: Lexiscan/modified Bruce nuclear stress test performed using 1-day protocol. protocol. Stress EKG is non-diagnostic, as this is pharmacological stress test. In addition, stress EKG at 74% MPHR showed sinus tachycardia, no ST-T abnormalities.  Normal myocardial perfusion. Stress LVEF 60%. Low risk study.  EKG   02/18/2020: Normal sinus rhythm with rate of 81 bpm, left atrial abnormality, 3 beat run of atrial tachycardia.  No evidence of ischemia.   Compared to 11/06/2019, 3 beat atrial tachycardia new.  Assessment     ICD-10-CM   1. Essential hypertension  I10   2. Vasodepressor syncope  R55   3. Bilateral leg edema  R60.0     No orders of the defined types were placed in this encounter.  There are no discontinued medications.  Recommendations:   DORTHULA BIER  is a 59 y.o. female  With  family history of premature coronary artery disease in her father had CAD at 76 Y and died at 12 Years of age, tobacco use quit in Dec 2019, 15-20 pack year history, hypertension and hyperlipidemia, mild coronary artery disease by coronary CTA on 06/08/2019 with mid LAD 50% stenosis.     Patient continues to have episodes of marked sweating, dizziness brings in blood pressure recordings, she does drop her blood pressure with no change in heart rate.  I suspect that she probably has vasodepressor syncope. I would like to try midodrine 5 mg p.o. 3 times daily.  She has had extensive evaluation by rheumatology and also for neuroendocrine tumor and all have been negative.   I will see her back in 3 months. Her neuroendocrine evaluation by Dr. Chalmers Cater and  Rheumatology Dr. Lahoma Rocker. Will keep them in the loop.   ***  ***Patient had developed worsening  renal function, hence HCTZ has been discontinued by nephrology.  Spironolactone has been reduced from 50 mg to 25 mg daily.  She was advised to take Lasix on a as needed basis for leg edema.  I will refill the prescription.  Discussed with the patient today verbally.   ***While speaking with pt to reschedule her appt, she stated she has restarted her Spirolactone and is experiencing swelling and puffiness.  She has made an appointment to see Lawerance Cruel 12/27

## 2020-12-12 ENCOUNTER — Telehealth: Payer: BC Managed Care – PPO | Admitting: Student

## 2020-12-12 NOTE — Telephone Encounter (Signed)
As covid test is negative, please see if patient can come in later this week for follow up.

## 2020-12-12 NOTE — Telephone Encounter (Signed)
From patient.

## 2020-12-13 NOTE — Telephone Encounter (Signed)
From [facility]

## 2020-12-28 DIAGNOSIS — M542 Cervicalgia: Secondary | ICD-10-CM | POA: Diagnosis not present

## 2020-12-30 ENCOUNTER — Other Ambulatory Visit: Payer: Self-pay | Admitting: Cardiology

## 2020-12-30 DIAGNOSIS — R55 Syncope and collapse: Secondary | ICD-10-CM

## 2021-01-02 NOTE — Progress Notes (Signed)
Primary Physician/Referring:  Deland Pretty, MD  Patient ID: Sharon Huffman, female    DOB: May 08, 1961, 60 y.o.   MRN: [id number]  Chief Complaint  Patient presents with   Loss of Consciousness   Follow-up   Coronary Artery Disease   HPI:    Sharon Huffman  is a 60 y.o. female with  family history of premature coronary artery disease in her father had CAD at 25 Y and died at 76 Years of age, tobacco use quit in Dec 2019, 15-20 pack year history, hypertension and hyperlipidemia, mild coronary artery disease by coronary CTA on 06/08/2019 with mid LAD 50% stenosis. She underwent nuclear stress test on 06/15/2020 which was normal.    Patient presents for 3 month follow up. At last visit started midodrine 5 mg 3 times daily for vasodepressor syncope. Patient reports midodrine has only minimally improved symptomatic hypotension. Patient continues to have both exertional and non-exertional episodes of sudden onset flushing, sensation of warmth in her face, sweating, and hypothermia. She denies chest pain, palpitations, syncope, near syncope, dyspnea.   Patient was recently seen by her nephrologist who recommended stopping spironalctone and furosemide as patient was euvolemic and her renal function was worsening. However, patient felt she had increased swelling and therefore restarted both diuretic medications. She is presently taking spironolactone 25 mg daily and furosemide 20 mg daily despite recommendation from Dr. Einar Gip (per MyChart messages) and nephrologist to stop diuretic therapy.   Past Medical History:  Diagnosis Date   ADHD    Anxiety    Bronchitis, chronic (HCC)    Cervical cancer (HCC)    Depression    Depression    Hyperlipidemia    Hypertension    Insomnia disorder    Lyme disease    Migraine    Ovarian cancer ([place])    Palpitations    Past Surgical History:  Procedure Laterality Date   ABDOMINAL HYSTERECTOMY  1994   ANTERIOR CERVICAL DECOMP/DISCECTOMY FUSION   2000   AUGMENTATION MAMMAPLASTY Bilateral 1998   BREAST ENHANCEMENT SURGERY Bilateral    EYE SURGERY  2016   PVD   Family History  Problem Relation Age of Onset   Dementia Mother    Hypertension Mother    Hyperlipidemia Mother    Heart attack Father        58   Heart disease Father     Social History   Tobacco Use   Smoking status: Former Smoker    Packs/day: 0.50    Years: 30.00    Pack years: 15.00    Types: Cigarettes    Start date: 12/18/1975    Quit date: 11/27/2018    Years since quitting: 2.1   Smokeless tobacco: Never Used  Substance Use Topics   Alcohol use: Not Currently    Alcohol/week: 7.0 standard drinks    Types: 7 Glasses of wine per week    Comment: Quit 2020.   Marital Status: Married  ROS  Review of Systems  Constitutional: Positive for diaphoresis and malaise/fatigue. Negative for weight gain.  Cardiovascular: Negative for chest pain, claudication, dyspnea on exertion, leg swelling, near-syncope, orthopnea, palpitations, paroxysmal nocturnal dyspnea and syncope.  Respiratory: Negative for shortness of breath.   Hematologic/Lymphatic: Does not bruise/bleed easily.  Skin: Positive for flushing.  Musculoskeletal: Positive for joint pain.  Gastrointestinal: Negative for diarrhea, heartburn and melena.  Neurological: Negative for dizziness, loss of balance and weakness.  Psychiatric/Behavioral: Negative for memory loss.  All other systems reviewed and are negative.  Objective  Blood pressure 100/62, pulse 85, temperature 98.2 F (36.8 C), height 5' 5"  (1.651 m), weight 150 lb (68 kg), SpO2 98 %.  Vitals with BMI 01/05/2021 10/06/2020 10/06/2020  Height 5' 5"  5' 5"  5' 5"   Weight 150 lbs 143 lbs 143 lbs  BMI 24.96 09.3 81.8  Systolic 299 - 371  Diastolic 62 - 65  Pulse 85 - 85    Orthostatic VS for the past 72 hrs (Last 3 readings):  Orthostatic BP Patient Position BP Location Cuff Size Orthostatic Pulse  01/05/21 1529 104/69 Standing  Left Arm Large 84  01/05/21 1528 -- Sitting Left Arm Large --  01/05/21 1520 110/63 Supine Left Arm Large 82     Physical Exam Vitals reviewed.  Constitutional:      General: She is not in acute distress.    Appearance: She is well-developed.  HENT:     Head: Normocephalic and atraumatic.  Cardiovascular:     Rate and Rhythm: Normal rate and regular rhythm.  No extrasystoles are present.    Pulses: Intact distal pulses.     Heart sounds: S1 normal and S2 normal. No murmur heard. No gallop.      Comments: No leg edema, no JVD.  Pulmonary:     Effort: Pulmonary effort is normal. No accessory muscle usage or respiratory distress.     Breath sounds: Normal breath sounds. No wheezing, rhonchi or rales.  Abdominal:     General: Bowel sounds are normal.     Palpations: Abdomen is soft.  Musculoskeletal:     Right lower leg: No edema.     Left lower leg: No edema.  Neurological:     Mental Status: She is alert.    Laboratory examination:   No results for input(s): NA, K, CL, CO2, GLUCOSE, BUN, CREATININE, CALCIUM, GFRNONAA, GFRAA in the last 8760 hours. CrCl cannot be calculated (Patient's most recent lab result is older than the maximum 21 days allowed.).  CMP Latest Ref Rng & Units 05/19/2019 09/15/2014  Glucose 65 - 99 mg/dL 114(H) 114(H)  BUN 6 - 24 mg/dL 15 9  Creatinine 0.57 - 1.00 mg/dL 0.78 0.70  Sodium 134 - 144 mmol/L 144 141  Potassium 3.5 - 5.2 mmol/L 4.2 3.6(L)  Chloride 96 - 106 mmol/L 102 97  CO2 20 - 29 mmol/L 27 29  Calcium 8.7 - 10.2 mg/dL 9.5 9.6   CBC Latest Ref Rng & Units 09/15/2014  WBC 4.0 - 10.5 K/uL 7.9  Hemoglobin 12.0 - 15.0 g/dL 13.3  Hematocrit 36.0 - 46.0 % 39.4  Platelets 150 - 400 K/uL 166   External labs:   08/01/2020: HDL 56, LDL 78, triglycerides 226  09/25/2019: Cholesterol, total 184.000 M 07/21/2018 HDL 39.000 07/28/2019 Triglycerides 227.000 07/28/2019  Creatinine, Serum 0.940 MG/ 10/01/2019 Potassium 4.200 05/19/2019 ALT (SGPT)  23.000 IU/ 07/21/2018  TSH 1.740 09/25/2019  07/28/2019: Total 154, triglycerides 227, HDL 39, LDL 70.  Non-HDL cholesterol 115.  Serum glucose 15 mg, BUN 13, creatinine 0.8, eGFR 75 mL, potassium 3.9. CMP otherwise normal.  CBC normal.  03/09/2014: Normal CBC, CMP. Total cholesterol 254, triglycerides 208, HDL 51, LDL 161. Non-HDL cholesterol 20 3.  Medications and allergies   Allergies  Allergen Reactions   Amlodipine Swelling and Other (See Comments)    Leg edema and fluid retention   Bystolic [Nebivolol Hcl] Other (See Comments)    sweat and get clammy   Sulfacetamide Nausea Only   Erythromycin Nausea And Vomiting    Current  Outpatient Medications on File Prior to Visit  Medication Sig Dispense Refill   acebutolol (SECTRAL) 200 MG capsule TAKE 1 CAPSULE (200 MG TOTAL) BY MOUTH EVERY EVENING. 90 capsule 1   amphetamine-dextroamphetamine (ADDERALL XR) 30 MG 24 hr capsule Take 30 mg by mouth at bedtime.     Biotin 10 MG CAPS Take by mouth daily.     cyclobenzaprine (FLEXERIL) 10 MG tablet Take 1 tablet (10 mg total) by mouth at bedtime. As needed for pain 60 tablet 0   famotidine (PEPCID) 20 MG tablet One after supper 30 tablet 11   FLUoxetine (PROZAC) 40 MG capsule Take 40 mg by mouth at bedtime.     ipratropium (ATROVENT) 0.06 % nasal spray Place 2 sprays into both nostrils 3 (three) times daily.     levothyroxine (SYNTHROID) 50 MCG tablet Take 50 mcg by mouth daily before breakfast.     midodrine (PROAMATINE) 5 MG tablet PLEASE SEE ATTACHED FOR DETAILED DIRECTIONS 270 tablet 0   nitroGLYCERIN (NITROSTAT) 0.4 MG SL tablet Place 0.4 mg under the tongue every 5 (five) minutes as needed for chest pain.     pantoprazole (PROTONIX) 40 MG tablet TAKE 1 TABLET (40 MG TOTAL) BY MOUTH DAILY. TAKE 30-60 MIN BEFORE FIRST MEAL OF THE DAY 30 tablet 0   rosuvastatin (CRESTOR) 10 MG tablet Take 10 mg by mouth daily.     traMADol (ULTRAM) 50 MG tablet Take 1 tablet (50 mg total) by  mouth 2 (two) times daily. (Patient taking differently: Take 50 mg by mouth 3 (three) times daily.) 30 tablet 0   zolpidem (AMBIEN) 5 MG tablet Take 5 mg by mouth at bedtime as needed for sleep.      ALPRAZolam (XANAX) 1 MG tablet Take 1 mg by mouth 3 (three) times daily.     No current facility-administered medications on file prior to visit.    Medications Discontinued During This Encounter  Medication Reason   ADDERALL XR 25 MG 24 hr capsule Dose change   Cholecalciferol (VITAMIN D-3) 25 MCG (1000 UT) CAPS Completed Course   Cranberry-Cholecalciferol 4200-500 MG-UNIT CAPS Completed Course   FLUoxetine (PROZAC) 20 MG capsule Duplicate   vitamin Z-61 (CYANOCOBALAMIN) 1000 MCG tablet Completed Course   furosemide (LASIX) 20 MG tablet Side effect (s)   spironolactone (ALDACTONE) 50 MG tablet Side effect (s)    Radiology:   Coronary CT angiogram 06/08/2019: Coronary calcium score 291.  Moderate long plaque in the proximal LAD, 25 to 49% stenosis, CT FFR plus.  Minimal plaque in other vessels.  Mild calcification of the aortic valve.  Cardiac Studies:   Treadmill Stress 04/05/2014: Indications: Screening for CAD. Conclusions: Negative for ischemia. Normal exercise tolerence. Symptoms: Dyspnea. THR met. Arrhythmia: None. Continue primary prevention.  The patient exercised according to the Bruce protocol, Total time recorded 8 Min. 2 sec.achieving a max heart rate of 147 which was 88% of MPHR for age and 9.2 METS of work. Baseline NIBP was 132/82. Peak NIBP was 170/74 MaxSysp was: 180 MaxDiasp was: 82. The baseline ECG showed NSR,Borderline LVH. No ischemia. During exercise there wasno ST-T changes of ischemia.  Echocardiogram 06/01/2019:  Normal LV systolic function with EF 56%. Left ventricle cavity is normal  in size. Mild concentric hypertrophy of the left ventricle. Normal global  wall motion. Normal diastolic filling pattern. Calculated EF 56%.  Aneurysmal interatrial septum  with possible PFO.  Mild (Grade I) mitral regurgitation.  IVC is dilated with respiratory variation.  No evidence of pulmonary  hypertension.  Lexiscan/modified Bruce Sestamibi stress test 06/15/2020: Lexiscan/modified Bruce nuclear stress test performed using 1-day protocol. protocol. Stress EKG is non-diagnostic, as this is pharmacological stress test. In addition, stress EKG at 74% MPHR showed sinus tachycardia, no ST-T abnormalities.  Normal myocardial perfusion. Stress LVEF 60%. Low risk study.  EKG   01/05/2021: Sinus rhythm at a rate of 82 bpm.  Left atrial enlargement.  Normal axis.  No evidence of ischemia or injury pattern.  Compared to EKG 02/18/2020, no atrial tachycardia noted.   02/18/2020: Normal sinus rhythm with rate of 81 bpm, left atrial abnormality, 3 beat run of atrial tachycardia.  No evidence of ischemia.   Compared to 11/06/2019, 3 beat atrial tachycardia new.  Assessment     ICD-10-CM   1. Essential hypertension  I10   2. Vasodepressor syncope  R55 EKG 12-Lead  3. Coronary artery disease of native artery of native heart with stable angina pectoris ([place])  I25.118 EKG 12-Lead  4. Dizziness  R42 Ambulatory referral to Neurology  5. Flushing reaction  R23.2 Ambulatory referral to Neurology    No orders of the defined types were placed in this encounter.  Medications Discontinued During This Encounter  Medication Reason   ADDERALL XR 25 MG 24 hr capsule Dose change   Cholecalciferol (VITAMIN D-3) 25 MCG (1000 UT) CAPS Completed Course   Cranberry-Cholecalciferol 4200-500 MG-UNIT CAPS Completed Course   FLUoxetine (PROZAC) 20 MG capsule Duplicate   vitamin K-56 (CYANOCOBALAMIN) 1000 MCG tablet Completed Course   furosemide (LASIX) 20 MG tablet Side effect (s)   spironolactone (ALDACTONE) 50 MG tablet Side effect (s)    Recommendations:   Sharon Huffman  is a 60 y.o. female  With  family history of premature coronary artery disease in her father had CAD at 48  Y and died at 67 Years of age, tobacco use quit in Dec 2019, 15-20 pack year history, hypertension and hyperlipidemia, mild coronary artery disease by coronary CTA on 06/08/2019 with mid LAD 50% stenosis.     Patient presents for 92-monthfollow-up.  Patient reports symptoms have only marginally improved with addition of midodrine 3 times daily.  Previously suspected episodes of dizziness, sweating, flushing may have been related to vasodepressor syncope, however suspect likely other underlying etiology.  Patient has had work-up by rheumatology and neuroendocrine evaluation, which were both negative.  In view of multiple autonomic symptoms including sudden onset diaphoresis, flushing, facial warmth, and dizziness both exertional and nonexertional without underlying cardiac etiology, recommend evaluation by neurology at this time.  In regard to diuretic therapy, discussed at length with patient that she is euvolemic on exam and that diuretics are harming her kidney function.  Patient verbalized understanding and now agrees to discontinue diuretic therapy, patient will stop spironolactone and furosemide.  Patient will notify our office if she experiences increased swelling when she stops these medications, however at this point there is no indication for her to be taking these and she would be doing harm to her kidneys if she continues.  Follow up in 6 months for CAD, hyperlipidemia, and hypertension.   Patient was seen in collaboration with Dr. GEinar Gip He also reviewed patient's chart and Dr. GEinar Gipis in agreement of the plan.    This was a 35-minute encounter with face-to-face counseling, medical records review, coordination of care, explanation of complex medical issues, complex medical decision making.     CAlethia Berthold PA-C 01/05/2021, 7:59 PM Office: [phone]

## 2021-01-05 ENCOUNTER — Encounter: Payer: Self-pay | Admitting: Student

## 2021-01-05 ENCOUNTER — Ambulatory Visit: Payer: BC Managed Care – PPO | Admitting: Student

## 2021-01-05 VITALS — BP 100/62 | HR 85 | Temp 98.2°F | Ht 65.0 in | Wt 150.0 lb

## 2021-01-05 DIAGNOSIS — I10 Essential (primary) hypertension: Secondary | ICD-10-CM | POA: Diagnosis not present

## 2021-01-05 DIAGNOSIS — R232 Flushing: Secondary | ICD-10-CM

## 2021-01-05 DIAGNOSIS — R55 Syncope and collapse: Secondary | ICD-10-CM | POA: Diagnosis not present

## 2021-01-05 DIAGNOSIS — I25118 Atherosclerotic heart disease of native coronary artery with other forms of angina pectoris: Secondary | ICD-10-CM

## 2021-01-05 DIAGNOSIS — R42 Dizziness and giddiness: Secondary | ICD-10-CM

## 2021-01-06 ENCOUNTER — Ambulatory Visit: Payer: BC Managed Care – PPO | Admitting: Cardiology

## 2021-01-26 DIAGNOSIS — I10 Essential (primary) hypertension: Secondary | ICD-10-CM | POA: Diagnosis not present

## 2021-01-26 DIAGNOSIS — G909 Disorder of the autonomic nervous system, unspecified: Secondary | ICD-10-CM | POA: Diagnosis not present

## 2021-01-26 DIAGNOSIS — F5104 Psychophysiologic insomnia: Secondary | ICD-10-CM | POA: Diagnosis not present

## 2021-01-26 DIAGNOSIS — F411 Generalized anxiety disorder: Secondary | ICD-10-CM | POA: Diagnosis not present

## 2021-02-07 ENCOUNTER — Other Ambulatory Visit: Payer: Self-pay | Admitting: Cardiology

## 2021-02-07 DIAGNOSIS — I10 Essential (primary) hypertension: Secondary | ICD-10-CM

## 2021-02-08 DIAGNOSIS — I10 Essential (primary) hypertension: Secondary | ICD-10-CM

## 2021-02-08 MED ORDER — SPIRONOLACTONE 25 MG PO TABS: 25.0000 mg | ORAL_TABLET | Freq: Every day | ORAL | 3 refills | Status: DC

## 2021-02-08 NOTE — Telephone Encounter (Signed)
from pt

## 2021-02-08 NOTE — Telephone Encounter (Signed)
ICD-10-CM   1. Essential hypertension  I10 spironolactone (ALDACTONE) 25 MG tablet   Nephrology okay in using Aldactone 25 mg.   Meds ordered this encounter  Medications  . spironolactone (ALDACTONE) 25 MG tablet    Sig: Take 1 tablet (25 mg total) by mouth daily.    Dispense:  30 tablet    Refill:  3     Adrian Prows, MD, [facility] 02/08/2021, [place] PM Office: [phone] Pager: [phone]

## 2021-02-20 DIAGNOSIS — M79643 Pain in unspecified hand: Secondary | ICD-10-CM | POA: Diagnosis not present

## 2021-02-20 DIAGNOSIS — M797 Fibromyalgia: Secondary | ICD-10-CM | POA: Diagnosis not present

## 2021-02-20 DIAGNOSIS — M151 Heberden's nodes (with arthropathy): Secondary | ICD-10-CM | POA: Diagnosis not present

## 2021-02-20 DIAGNOSIS — M1990 Unspecified osteoarthritis, unspecified site: Secondary | ICD-10-CM | POA: Diagnosis not present

## 2021-02-23 DIAGNOSIS — N183 Chronic kidney disease, stage 3 unspecified: Secondary | ICD-10-CM | POA: Diagnosis not present

## 2021-03-07 IMAGING — DX CHEST - 2 VIEW
2 series · 2 of 2 positions shown · non-contrast
Comparison: None.

CLINICAL DATA: Dry cough for "years" with worsening x 2 months.
Quit smoking [DATE]. Bilateral midline chest (pt states feels like
a band) intermittent tightness. Pt states worse at the end of the
day.

EXAM:
CHEST - 2 VIEW

[chest pa]
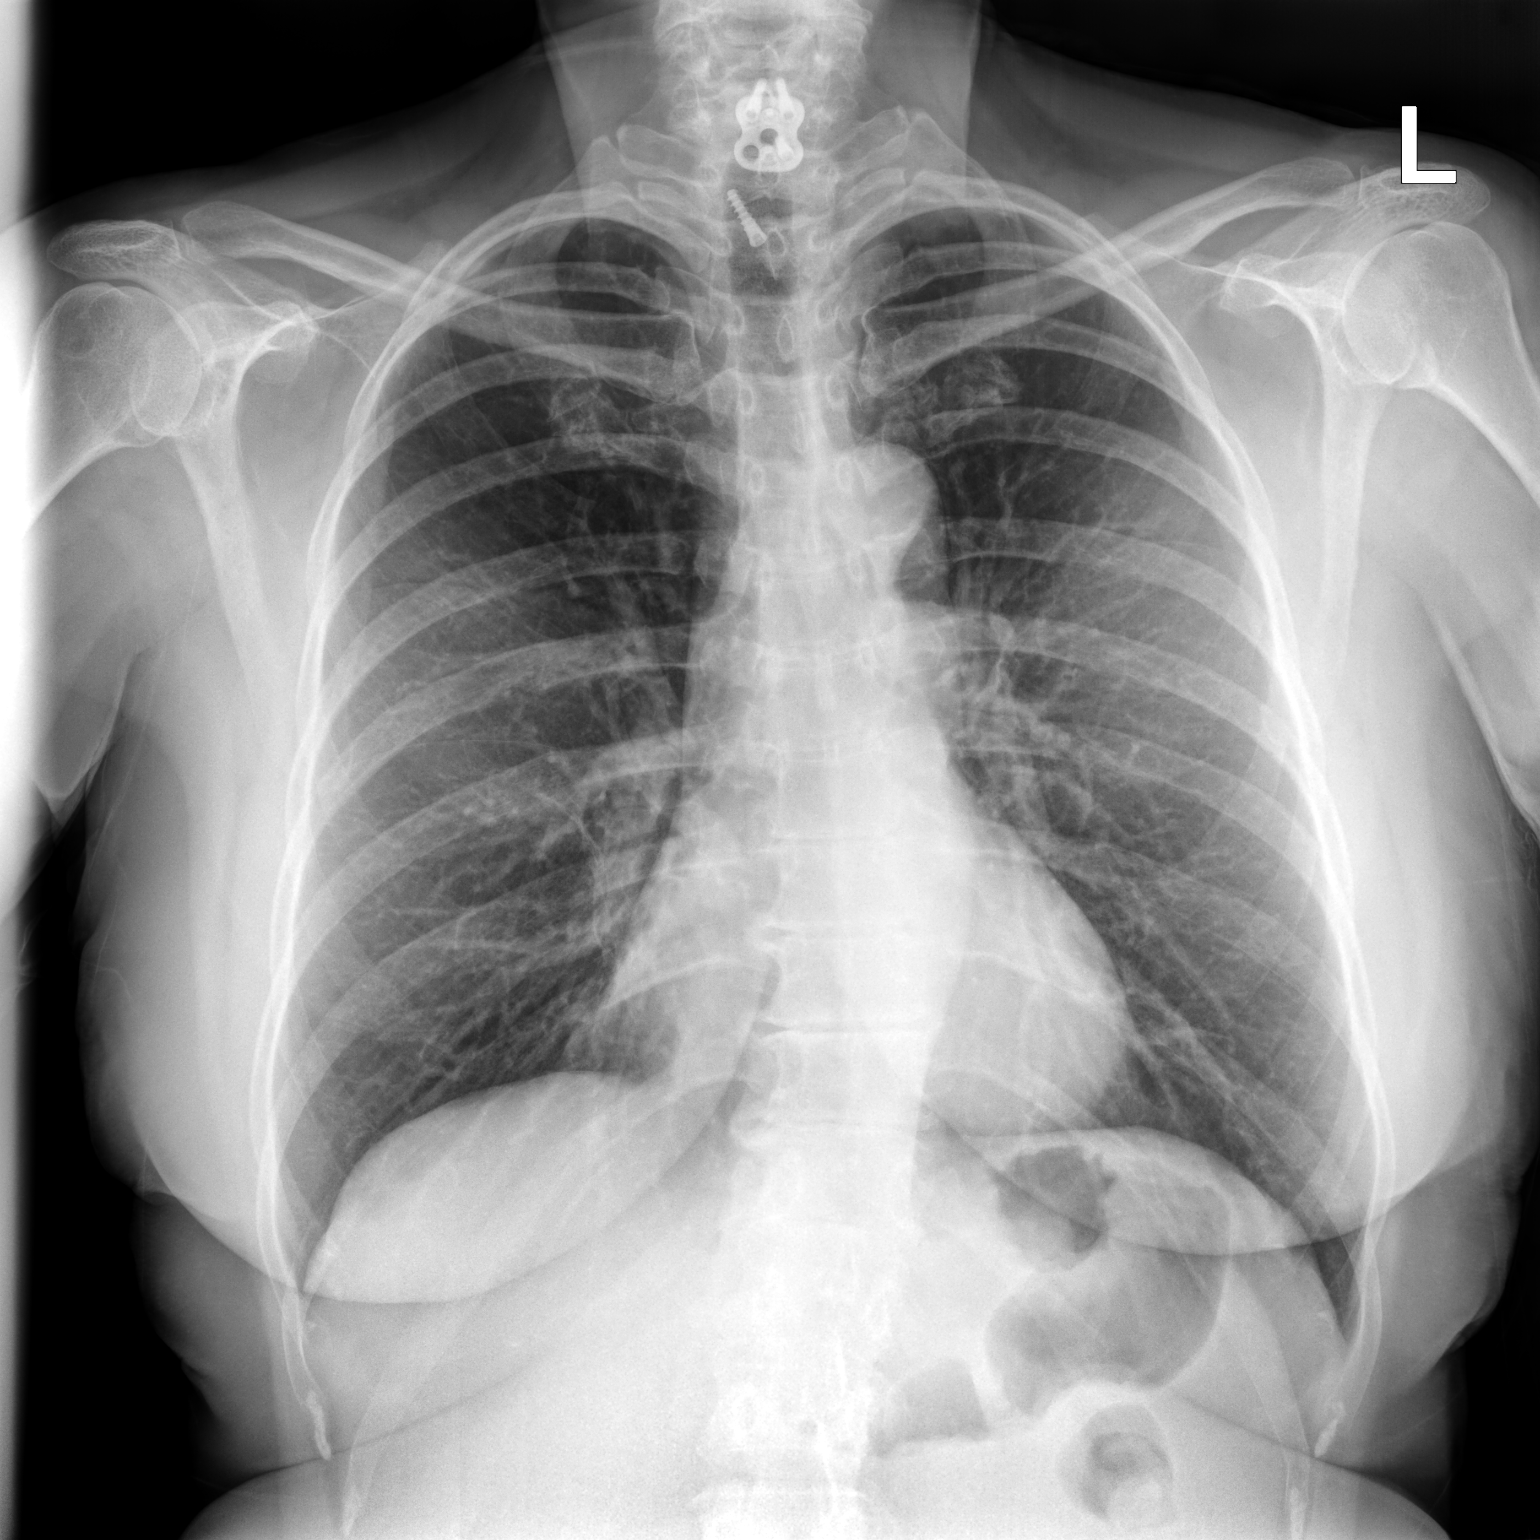

[chest lat]
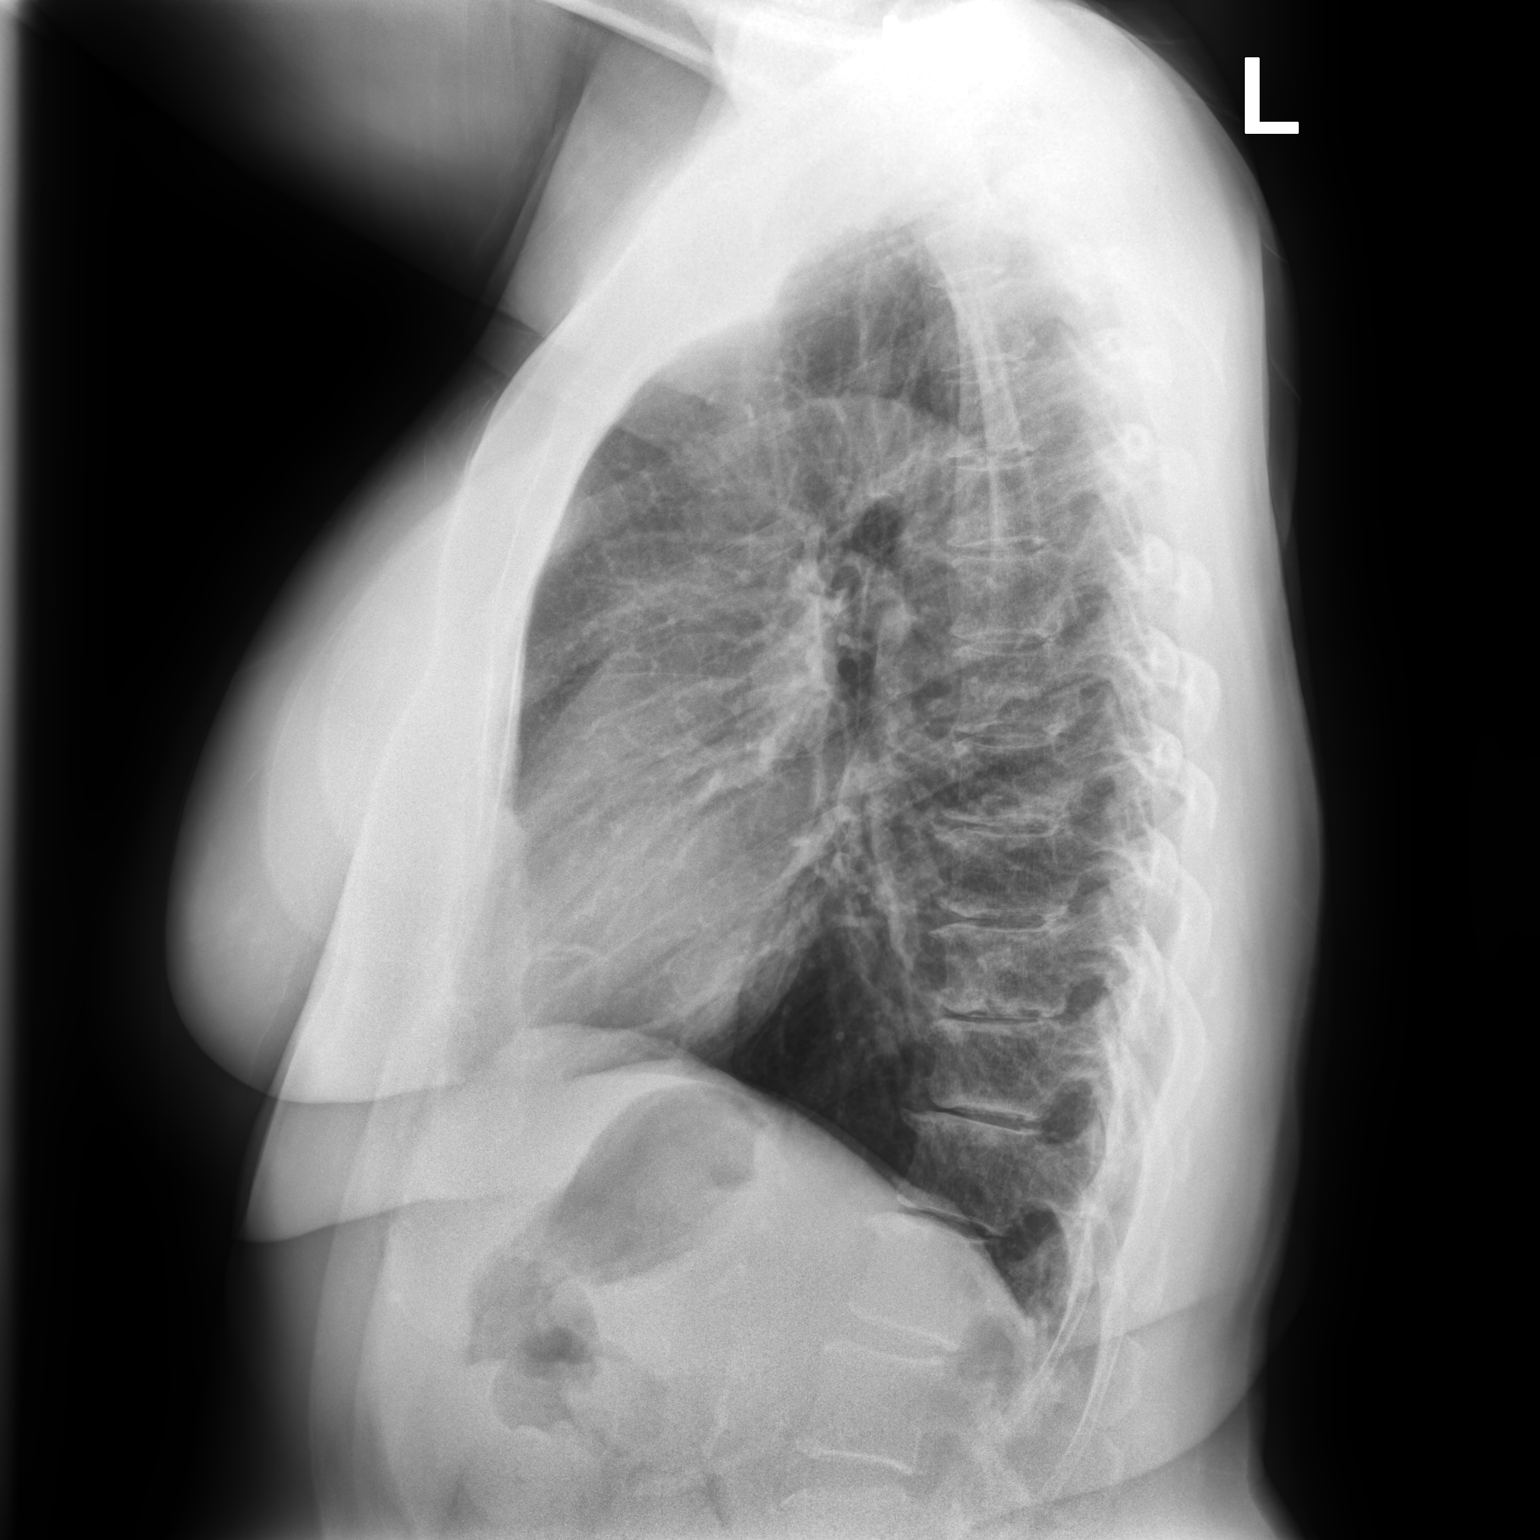

[2 of 2 positions shown; findings below may reference images not displayed]

FINDINGS: The cardiac silhouette is normal in size. No mediastinal or hilar
masses. There is no evidence of adenopathy.

Lungs are hyperexpanded but clear. No pleural effusion or
pneumothorax.

There changes from a prior anterior cervical spine fusion. Skeletal
structures are intact.
IMPRESSION: No active cardiopulmonary disease.

## 2021-03-23 ENCOUNTER — Other Ambulatory Visit: Payer: Self-pay | Admitting: Cardiology

## 2021-03-23 DIAGNOSIS — R55 Syncope and collapse: Secondary | ICD-10-CM

## 2021-03-28 DIAGNOSIS — M797 Fibromyalgia: Secondary | ICD-10-CM | POA: Diagnosis not present

## 2021-03-28 DIAGNOSIS — R519 Headache, unspecified: Secondary | ICD-10-CM | POA: Diagnosis not present

## 2021-03-28 DIAGNOSIS — M1990 Unspecified osteoarthritis, unspecified site: Secondary | ICD-10-CM | POA: Diagnosis not present

## 2021-03-28 DIAGNOSIS — F439 Reaction to severe stress, unspecified: Secondary | ICD-10-CM | POA: Diagnosis not present

## 2021-04-30 ENCOUNTER — Other Ambulatory Visit: Payer: Self-pay | Admitting: Cardiology

## 2021-04-30 DIAGNOSIS — R002 Palpitations: Secondary | ICD-10-CM

## 2021-04-30 DIAGNOSIS — I10 Essential (primary) hypertension: Secondary | ICD-10-CM

## 2021-05-13 ENCOUNTER — Other Ambulatory Visit: Payer: Self-pay | Admitting: Cardiology

## 2021-05-13 DIAGNOSIS — I10 Essential (primary) hypertension: Secondary | ICD-10-CM

## 2021-06-06 DIAGNOSIS — M1990 Unspecified osteoarthritis, unspecified site: Secondary | ICD-10-CM | POA: Diagnosis not present

## 2021-06-06 DIAGNOSIS — M064 Inflammatory polyarthropathy: Secondary | ICD-10-CM | POA: Diagnosis not present

## 2021-06-06 DIAGNOSIS — G909 Disorder of the autonomic nervous system, unspecified: Secondary | ICD-10-CM | POA: Diagnosis not present

## 2021-06-06 DIAGNOSIS — M25511 Pain in right shoulder: Secondary | ICD-10-CM | POA: Diagnosis not present

## 2021-06-24 ENCOUNTER — Other Ambulatory Visit: Payer: Self-pay | Admitting: Student

## 2021-06-24 DIAGNOSIS — R55 Syncope and collapse: Secondary | ICD-10-CM

## 2021-06-28 DIAGNOSIS — Z8739 Personal history of other diseases of the musculoskeletal system and connective tissue: Secondary | ICD-10-CM | POA: Diagnosis not present

## 2021-06-28 DIAGNOSIS — M25519 Pain in unspecified shoulder: Secondary | ICD-10-CM | POA: Diagnosis not present

## 2021-06-28 DIAGNOSIS — Z79891 Long term (current) use of opiate analgesic: Secondary | ICD-10-CM | POA: Diagnosis not present

## 2021-06-28 DIAGNOSIS — G894 Chronic pain syndrome: Secondary | ICD-10-CM | POA: Diagnosis not present

## 2021-06-28 DIAGNOSIS — Z79899 Other long term (current) drug therapy: Secondary | ICD-10-CM | POA: Diagnosis not present

## 2021-07-05 ENCOUNTER — Other Ambulatory Visit: Payer: Self-pay | Admitting: Physician Assistant

## 2021-07-05 ENCOUNTER — Ambulatory Visit: Payer: BC Managed Care – PPO | Admitting: Student

## 2021-07-05 DIAGNOSIS — M25511 Pain in right shoulder: Secondary | ICD-10-CM

## 2021-07-19 ENCOUNTER — Encounter: Payer: Self-pay | Admitting: Cardiology

## 2021-07-19 ENCOUNTER — Ambulatory Visit: Payer: BC Managed Care – PPO | Admitting: Cardiology

## 2021-07-19 ENCOUNTER — Other Ambulatory Visit: Payer: Self-pay

## 2021-07-19 VITALS — BP 101/68 | HR 90 | Temp 97.3°F | Resp 17 | Ht 65.0 in | Wt 173.6 lb

## 2021-07-19 DIAGNOSIS — R55 Syncope and collapse: Secondary | ICD-10-CM | POA: Diagnosis not present

## 2021-07-19 DIAGNOSIS — R5382 Chronic fatigue, unspecified: Secondary | ICD-10-CM

## 2021-07-19 DIAGNOSIS — I951 Orthostatic hypotension: Secondary | ICD-10-CM | POA: Diagnosis not present

## 2021-07-19 DIAGNOSIS — I25118 Atherosclerotic heart disease of native coronary artery with other forms of angina pectoris: Secondary | ICD-10-CM | POA: Diagnosis not present

## 2021-07-19 DIAGNOSIS — I10 Essential (primary) hypertension: Secondary | ICD-10-CM | POA: Diagnosis not present

## 2021-07-19 NOTE — Progress Notes (Signed)
Primary Physician/Referring:  Deland Pretty, MD  Patient ID: Sharon Huffman, female    DOB: [birth year], 60 y.o.   MRN: [id number]  Chief Complaint  Patient presents with  . Coronary Artery Disease    6 month  . flushing  . Dizziness   HPI:    Sharon Huffman  is a 60 y.o.  female with  family history of premature coronary artery disease in her father had CAD at 55 Y and died at 29 Years of age, tobacco use quit in Dec 2019, 15-20 pack year history, hypertension and hyperlipidemia, mild coronary artery disease by coronary CTA on 06/08/2019 with mid LAD 50% stenosis, FFR negative for significant stenosis.  She also has history of   Patient presents for 87-monthfollow-up.  With a complex presentation with episodes of dysautonomia, hypothermia, hyperthermia, excessive sweating that unknown times, weight gain and weight loss, orthostatic hypotension, palpitations, fatigue and arthritis.  He has not had any further episodes of syncope or near syncope, leg edema has remained stable, no further deterioration in renal function.  Denies chest pain or palpitations.  Continues to have dysautonomic symptoms.  The past and was teary.   Past Medical History:  Diagnosis Date  . ADHD   . Anxiety   . [place]   . [place]   . Depression   . Depression   . Hyperlipidemia   . Hypertension   . Insomnia disorder   . Lyme disease   . Migraine   . [place] cancer ([place]   . Palpitations    Past Surgical History:  Procedure Laterality Date  . ABDOMINAL HYSTERECTOMY  1994  . ANTERIOR CERVICAL DECOMP/DISCECTOMY FUSION  2000  . AUGMENTATION MAMMAPLASTY Bilateral 1998  . BREAST ENHANCEMENT SURGERY Bilateral   . EYE SURGERY  2016   PVD   Family History  Problem Relation Age of Onset  . Dementia Mother   . Hypertension Mother   . Hyperlipidemia Mother   . Heart attack Father        160 . Heart disease Father     Social History   Tobacco Use  . Smoking status: Former     Packs/day: 0.50    Years: 30.00    Pack years: 15.00    Types: Cigarettes    Start date: 12/18/1975    Quit date: 11/27/2018    Years since quitting: 2.6  . Smokeless tobacco: Never  Substance Use Topics  . Alcohol use: Not Currently    Alcohol/week: 7.0 standard drinks    Types: 7 Glasses of wine per week    Comment: Quit 2020.   Marital Status: Married  ROS  Review of Systems  Constitutional: Positive for malaise/fatigue.  Cardiovascular:  Negative for chest pain, dyspnea on exertion and leg swelling.  Musculoskeletal:  Positive for arthritis.  Gastrointestinal:  Negative for melena.  Psychiatric/Behavioral:  Positive for depression. Negative for suicidal ideas.   Objective  Blood pressure 101/68, pulse 90, temperature (!) 97.3 F (36.3 C), temperature source Temporal, resp. rate 17, height _0  (1.651 m), weight 173 lb 9.6 oz (78.7 kg), SpO2 97 %.  Vitals with BMI 07/19/2021 01/05/2021 10/06/2020  Height _1  _2  _3   Weight 173 lbs 10 oz 150 lbs 143 lbs  BMI 28.89 [phone] Systolic [id number]  Diastolic 68 62 -  Pulse 90 85 -    Orthostatic VS for the past 72 hrs (Last 3 readings):  Orthostatic BP  Patient Position BP Location Cuff Size Orthostatic Pulse  07/19/21 1556 122/72 Standing Left Arm Normal 88  07/19/21 1555 111/77 Sitting Left Arm Normal 79  07/19/21 1554 119/67 Supine Left Arm Normal 86     Physical Exam Vitals reviewed.  Constitutional:      General: She is not in acute distress.    Appearance: She is well-developed.  Cardiovascular:     Rate and Rhythm: Normal rate and regular rhythm. No extrasystoles are present.    Pulses: Intact distal pulses.     Heart sounds: S1 normal and S2 normal. No murmur heard.   No gallop.     Comments: No leg edema, no JVD.  Pulmonary:     Effort: Pulmonary effort is normal. No accessory muscle usage or respiratory distress.     Breath sounds: Normal breath sounds. No wheezing, rhonchi or rales.  Abdominal:      General: Bowel sounds are normal.     Palpations: Abdomen is soft.  Musculoskeletal:     Right lower leg: No edema.     Left lower leg: No edema.   Laboratory examination:   No results for input(s): NA, K, CL, CO2, GLUCOSE, BUN, CREATININE, CALCIUM, GFRNONAA, GFRAA in the last 8760 hours. CrCl cannot be calculated (Patient's most recent lab result is older than the maximum 21 days allowed.).  CMP Latest Ref Rng & Units 05/19/2019 09/15/2014  Glucose 65 - 99 mg/dL 114(H) 114(H)  BUN 6 - 24 mg/dL 15 9  Creatinine 0.57 - 1.00 mg/dL 0.78 0.70  Sodium 134 - 144 mmol/L 144 141  Potassium 3.5 - 5.2 mmol/L 4.2 3.6(L)  Chloride 96 - 106 mmol/L 102 97  CO2 20 - 29 mmol/L 27 29  Calcium 8.7 - 10.2 mg/dL 9.5 9.6   CBC Latest Ref Rng & Units 09/15/2014  WBC 4.0 - 10.5 K/uL 7.9  Hemoglobin 12.0 - 15.0 g/dL 13.3  Hematocrit 36.0 - 46.0 % 39.4  Platelets 150 - 400 K/uL 166   External labs:   08/01/2020: HDL 56, LDL 78, triglycerides 226  09/25/2019: Cholesterol, total 184.000 M 07/21/2018 HDL 39.000 07/28/2019 Triglycerides 227.000 07/28/2019  Creatinine, Serum 0.940 MG/ 10/01/2019 Potassium 4.200 05/19/2019 ALT (SGPT) 23.000 IU/ 07/21/2018  TSH 1.740 09/25/2019  07/28/2019: Total 154, triglycerides 227, HDL 39, LDL 70.  Non-HDL cholesterol 115.  Serum glucose 15 mg, BUN 13, creatinine 0.8, eGFR 75 mL, potassium 3.9. CMP otherwise normal.  CBC normal.  03/09/2014: Normal CBC, CMP. Total cholesterol 254, triglycerides 208, HDL 51, LDL 161. Non-HDL cholesterol 20 3.  Medications and allergies   Allergies  Allergen Reactions  . Amlodipine Swelling and Other (See Comments)    Leg edema and fluid retention  . Amoxicillin-Pot Clavulanate Nausea Only  . Atorvastatin     Other reaction(s): back ache  . Bystolic [Nebivolol Hcl] Other (See Comments)    sweat and get clammy  . Carbamazepine     Other reaction(s): rash  . Mirtazapine     Other reaction(s): edema, vivid nightmares  . Sulfacetamide  Nausea Only  . Erythromycin Nausea And Vomiting    Current Outpatient Medications on File Prior to Visit  Medication Sig Dispense Refill  . acebutolol (SECTRAL) 200 MG capsule TAKE 1 CAPSULE (200 MG TOTAL) BY MOUTH EVERY EVENING. 90 capsule 1  . ALPRAZolam (XANAX) 1 MG tablet Take 1 mg by mouth 3 (three) times daily.    Marland Kitchen amphetamine-dextroamphetamine (ADDERALL XR) 30 MG 24 hr capsule Take 30 mg by mouth at bedtime.    Marland Kitchen  Biotin 10 MG CAPS Take by mouth daily.    . famotidine (PEPCID) 20 MG tablet One after supper 30 tablet 11  . FLUoxetine (PROZAC) 40 MG capsule Take 40 mg by mouth at bedtime.    . furosemide (LASIX) 20 MG tablet Take 20 mg by mouth daily as needed.    . gabapentin (NEURONTIN) 100 MG capsule Take 100 mg by mouth 3 (three) times daily.    Marland Kitchen HYDROcodone-acetaminophen (NORCO) 7.5-325 MG tablet Take 1 tablet by mouth 4 (four) times daily as needed.    . hydroxychloroquine (PLAQUENIL) 200 MG tablet Take 400 mg by mouth daily.    Marland Kitchen ipratropium (ATROVENT) 0.06 % nasal spray Place 2 sprays into both nostrils as needed.    Marland Kitchen levothyroxine (SYNTHROID) 50 MCG tablet Take 50 mcg by mouth daily before breakfast.    . midodrine (PROAMATINE) 5 MG tablet USE AS DIRECTED 270 tablet 0  . nitroGLYCERIN (NITROSTAT) 0.4 MG SL tablet Place 0.4 mg under the tongue every 5 (five) minutes as needed for chest pain.    . pantoprazole (PROTONIX) 40 MG tablet TAKE 1 TABLET (40 MG TOTAL) BY MOUTH DAILY. TAKE 30-60 MIN BEFORE FIRST MEAL OF THE DAY 30 tablet 0  . rosuvastatin (CRESTOR) 10 MG tablet Take 10 mg by mouth daily.    Marland Kitchen spironolactone (ALDACTONE) 25 MG tablet TAKE 1 TABLET (25 MG TOTAL) BY MOUTH DAILY. 90 tablet 1  . traMADol (ULTRAM) 50 MG tablet Take 1 tablet (50 mg total) by mouth 2 (two) times daily. (Patient taking differently: Take 50 mg by mouth as needed.) 30 tablet 0  . zolpidem (AMBIEN) 5 MG tablet Take 5 mg by mouth at bedtime as needed for sleep.      No current facility-administered  medications on file prior to visit.    Medications Discontinued During This Encounter  Medication Reason  . cyclobenzaprine (FLEXERIL) 10 MG tablet Error   Medications after today's encounter  Current Outpatient Medications  Medication Instructions  . acebutolol (SECTRAL) 200 mg, Oral, Every evening  . ALPRAZolam (XANAX) 1 mg, Oral, 3 times daily  . amphetamine-dextroamphetamine (ADDERALL XR) 30 MG 24 hr capsule 30 mg, Oral, Daily at bedtime  . Biotin 10 MG CAPS Oral, Daily  . famotidine (PEPCID) 20 MG tablet One after supper  . FLUoxetine (PROZAC) 40 mg, Oral, Daily at bedtime  . furosemide (LASIX) 20 mg, Oral, Daily PRN  . gabapentin (NEURONTIN) 100 mg, Oral, 3 times daily  . HYDROcodone-acetaminophen (NORCO) 7.5-325 MG tablet 1 tablet, Oral, 4 times daily PRN  . hydroxychloroquine (PLAQUENIL) 400 mg, Oral, Daily  . ipratropium (ATROVENT) 0.06 % nasal spray 2 sprays, Each Nare, As needed  . levothyroxine (SYNTHROID) 50 mcg, Oral, Daily before breakfast  . midodrine (PROAMATINE) 5 MG tablet USE AS DIRECTED  . nitroGLYCERIN (NITROSTAT) 0.4 mg, Sublingual, Every 5 min PRN  . pantoprazole (PROTONIX) 40 mg, Oral, Daily, Take 30-60 min before first meal of the day  . rosuvastatin (CRESTOR) 10 mg, Oral, Daily  . spironolactone (ALDACTONE) 25 mg, Oral, Daily  . traMADol (ULTRAM) 50 mg, Oral, 2 times daily  . zolpidem (AMBIEN) 5 mg, Oral, At bedtime PRN    Radiology:   Coronary CT angiogram 06/08/2019: Coronary calcium score 291.  Moderate long plaque in the proximal LAD, 25 to 49% stenosis, CT FFR plus.  Minimal plaque in other vessels.  Mild calcification of the aortic valve. CT FFR analysis didn't show any significant stenosis.   Cardiac Studies:   Treadmill  Stress 04/05/2014: Indications: Screening for CAD. Conclusions: Negative for ischemia. Normal exercise tolerence. Symptoms: Dyspnea. THR met. Arrhythmia: None. Continue primary prevention.  The patient exercised according to  the Bruce protocol, Total time recorded 8 Min. 2 sec.achieving a max heart rate of 147 which was 88% of MPHR for age and 9.2 METS of work. Baseline NIBP was 132/82. Peak NIBP was 170/74 MaxSysp was: 180 MaxDiasp was: 82. The baseline ECG showed NSR,Borderline LVH. No ischemia. During exercise there wasno ST-T changes of ischemia.  Echocardiogram 06/01/2019:  Normal LV systolic function with EF 56%. Left ventricle cavity is normal  in size. Mild concentric hypertrophy of the left ventricle. Normal global  wall motion. Normal diastolic filling pattern. Calculated EF 56%.  Aneurysmal interatrial septum with possible PFO.  Mild (Grade I) mitral regurgitation.  IVC is dilated with respiratory variation.  No evidence of pulmonary hypertension.  Lexiscan/modified Bruce Sestamibi stress test 06/15/2020: Lexiscan/modified Bruce nuclear stress test performed using 1-day protocol. protocol. Stress EKG is non-diagnostic, as this is pharmacological stress test. In addition, stress EKG at 74% MPHR showed sinus tachycardia, no ST-T abnormalities.  Normal myocardial perfusion. Stress LVEF 60%. Low risk study.  EKG   EKG 07/19/2021: Normal sinus rhythm at rate of 88 bpm, left atrial enlargement, normal axis.  No evidence of ischemia, otherwise normal EKG. No significant change from 01/05/2021:  Assessment     ICD-10-CM   1. Coronary artery disease of native artery of native heart with stable angina pectoris ([place])  I25.118     2. Essential hypertension  I10 EKG 12-Lead    3. Vasodepressor syncope  R55     4. Orthostatic hypotension  I95.1       No orders of the defined types were placed in this encounter.  Medications Discontinued During This Encounter  Medication Reason  . cyclobenzaprine (FLEXERIL) 10 MG tablet Error    Recommendations:   ADALEA HANDLER  is a 60 y.o. female with  family history of premature coronary artery disease in her father had CAD at 41 Y and died at 65 Years of age, tobacco  use quit in Dec 2019, 15-20 pack year history, hypertension and hyperlipidemia, mild coronary artery disease by coronary CTA on 06/08/2019 with mid LAD 50% stenosis, FFR negative for significant stenosis.  She also has history of   Patient presents for 21-monthfollow-up.  With a complex presentation with episodes of dysautonomia, hypothermia, hyperthermia, excessive sweating that unknown times, weight gain and weight loss, orthostatic hypotension, palpitations, fatigue and arthritis.  Fortunately from cardiac standpoint she has not had any further episodes of syncope or near syncope, weight has remained stable, no PND or orthopnea.  With addition of present medications, orthostatic hypotension symptoms have been stable and supine hypertension has been stable as well.  [facility]  Patient states that she is unable to take days off as she takes care of her mother at home who is elderly.  He has had extensive evaluation from endocrinology including evaluation for paracrine and endocrine evaluation including paraneoplastic syndrome and all have been inconclusive. I did not make changes to her meds. I spent time counseling her about rare disease evaluation centers.   This was a 35-minute encounter with face-to-face counseling, medical records review, coordination of care, explanation of complex medical issues, complex medical decision making.     JAdrian Prows PA-C 07/19/2021, 4:34 PM Office: [phone]

## 2021-07-26 DIAGNOSIS — M25519 Pain in unspecified shoulder: Secondary | ICD-10-CM | POA: Diagnosis not present

## 2021-07-26 DIAGNOSIS — G894 Chronic pain syndrome: Secondary | ICD-10-CM | POA: Diagnosis not present

## 2021-08-08 DIAGNOSIS — E039 Hypothyroidism, unspecified: Secondary | ICD-10-CM | POA: Diagnosis not present

## 2021-08-08 DIAGNOSIS — I10 Essential (primary) hypertension: Secondary | ICD-10-CM | POA: Diagnosis not present

## 2021-08-08 DIAGNOSIS — F339 Major depressive disorder, recurrent, unspecified: Secondary | ICD-10-CM | POA: Diagnosis not present

## 2021-08-08 DIAGNOSIS — Z0000 Encounter for general adult medical examination without abnormal findings: Secondary | ICD-10-CM | POA: Diagnosis not present

## 2021-08-08 DIAGNOSIS — E785 Hyperlipidemia, unspecified: Secondary | ICD-10-CM | POA: Diagnosis not present

## 2021-08-30 DIAGNOSIS — G894 Chronic pain syndrome: Secondary | ICD-10-CM | POA: Diagnosis not present

## 2021-08-30 DIAGNOSIS — M25519 Pain in unspecified shoulder: Secondary | ICD-10-CM | POA: Diagnosis not present

## 2021-09-27 ENCOUNTER — Other Ambulatory Visit: Payer: Self-pay | Admitting: Student

## 2021-09-27 DIAGNOSIS — R55 Syncope and collapse: Secondary | ICD-10-CM

## 2021-10-05 DIAGNOSIS — E039 Hypothyroidism, unspecified: Secondary | ICD-10-CM | POA: Diagnosis not present

## 2021-10-05 DIAGNOSIS — M25519 Pain in unspecified shoulder: Secondary | ICD-10-CM | POA: Diagnosis not present

## 2021-10-05 DIAGNOSIS — G894 Chronic pain syndrome: Secondary | ICD-10-CM | POA: Diagnosis not present

## 2021-10-12 DIAGNOSIS — E039 Hypothyroidism, unspecified: Secondary | ICD-10-CM | POA: Diagnosis not present

## 2021-10-26 DIAGNOSIS — L918 Other hypertrophic disorders of the skin: Secondary | ICD-10-CM | POA: Diagnosis not present

## 2021-10-26 DIAGNOSIS — L821 Other seborrheic keratosis: Secondary | ICD-10-CM | POA: Diagnosis not present

## 2021-10-29 ENCOUNTER — Other Ambulatory Visit: Payer: Self-pay | Admitting: Cardiology

## 2021-10-29 DIAGNOSIS — I10 Essential (primary) hypertension: Secondary | ICD-10-CM

## 2021-10-29 DIAGNOSIS — R002 Palpitations: Secondary | ICD-10-CM

## 2021-11-02 DIAGNOSIS — G894 Chronic pain syndrome: Secondary | ICD-10-CM | POA: Diagnosis not present

## 2021-11-02 DIAGNOSIS — Z79899 Other long term (current) drug therapy: Secondary | ICD-10-CM | POA: Diagnosis not present

## 2021-11-02 DIAGNOSIS — Z8739 Personal history of other diseases of the musculoskeletal system and connective tissue: Secondary | ICD-10-CM | POA: Diagnosis not present

## 2021-11-02 DIAGNOSIS — Z79891 Long term (current) use of opiate analgesic: Secondary | ICD-10-CM | POA: Diagnosis not present

## 2021-11-06 ENCOUNTER — Other Ambulatory Visit: Payer: Self-pay

## 2021-11-06 DIAGNOSIS — I10 Essential (primary) hypertension: Secondary | ICD-10-CM

## 2021-11-06 DIAGNOSIS — R002 Palpitations: Secondary | ICD-10-CM

## 2021-11-06 MED ORDER — ACEBUTOLOL HCL 200 MG PO CAPS: 200.0000 mg | ORAL_CAPSULE | Freq: Every day | ORAL | 2 refills | Status: DC

## 2021-11-08 DIAGNOSIS — F988 Other specified behavioral and emotional disorders with onset usually occurring in childhood and adolescence: Secondary | ICD-10-CM | POA: Diagnosis not present

## 2021-11-08 DIAGNOSIS — E039 Hypothyroidism, unspecified: Secondary | ICD-10-CM | POA: Diagnosis not present

## 2021-11-08 DIAGNOSIS — B370 Candidal stomatitis: Secondary | ICD-10-CM | POA: Diagnosis not present

## 2021-11-21 ENCOUNTER — Other Ambulatory Visit: Payer: Self-pay | Admitting: Cardiology

## 2021-11-21 DIAGNOSIS — I10 Essential (primary) hypertension: Secondary | ICD-10-CM

## 2021-11-25 ENCOUNTER — Other Ambulatory Visit: Payer: Self-pay | Admitting: Cardiology

## 2021-11-25 DIAGNOSIS — R600 Localized edema: Secondary | ICD-10-CM

## 2021-12-07 ENCOUNTER — Other Ambulatory Visit: Payer: Self-pay | Admitting: Student

## 2021-12-07 DIAGNOSIS — Z8739 Personal history of other diseases of the musculoskeletal system and connective tissue: Secondary | ICD-10-CM | POA: Diagnosis not present

## 2021-12-07 DIAGNOSIS — M25519 Pain in unspecified shoulder: Secondary | ICD-10-CM | POA: Diagnosis not present

## 2021-12-07 DIAGNOSIS — Z981 Arthrodesis status: Secondary | ICD-10-CM | POA: Diagnosis not present

## 2021-12-07 DIAGNOSIS — R55 Syncope and collapse: Secondary | ICD-10-CM

## 2021-12-12 ENCOUNTER — Other Ambulatory Visit: Payer: Self-pay | Admitting: Physician Assistant

## 2021-12-12 DIAGNOSIS — M542 Cervicalgia: Secondary | ICD-10-CM

## 2021-12-21 DIAGNOSIS — J0100 Acute maxillary sinusitis, unspecified: Secondary | ICD-10-CM | POA: Diagnosis not present

## 2021-12-21 DIAGNOSIS — H6691 Otitis media, unspecified, right ear: Secondary | ICD-10-CM | POA: Diagnosis not present

## 2022-01-05 ENCOUNTER — Other Ambulatory Visit: Payer: Self-pay | Admitting: Cardiology

## 2022-01-05 ENCOUNTER — Other Ambulatory Visit: Payer: BC Managed Care – PPO

## 2022-01-05 DIAGNOSIS — R55 Syncope and collapse: Secondary | ICD-10-CM

## 2022-01-18 DIAGNOSIS — M25519 Pain in unspecified shoulder: Secondary | ICD-10-CM | POA: Diagnosis not present

## 2022-01-18 DIAGNOSIS — G894 Chronic pain syndrome: Secondary | ICD-10-CM | POA: Diagnosis not present

## 2022-01-18 DIAGNOSIS — Z981 Arthrodesis status: Secondary | ICD-10-CM | POA: Diagnosis not present

## 2022-01-25 DIAGNOSIS — B370 Candidal stomatitis: Secondary | ICD-10-CM | POA: Diagnosis not present

## 2022-01-25 DIAGNOSIS — B3781 Candidal esophagitis: Secondary | ICD-10-CM | POA: Diagnosis not present

## 2022-02-06 DIAGNOSIS — R200 Anesthesia of skin: Secondary | ICD-10-CM | POA: Diagnosis not present

## 2022-02-06 DIAGNOSIS — M79609 Pain in unspecified limb: Secondary | ICD-10-CM | POA: Diagnosis not present

## 2022-02-08 DIAGNOSIS — F900 Attention-deficit hyperactivity disorder, predominantly inattentive type: Secondary | ICD-10-CM | POA: Diagnosis not present

## 2022-02-15 DIAGNOSIS — Z79891 Long term (current) use of opiate analgesic: Secondary | ICD-10-CM | POA: Diagnosis not present

## 2022-03-30 DIAGNOSIS — Z79891 Long term (current) use of opiate analgesic: Secondary | ICD-10-CM | POA: Diagnosis not present

## 2022-04-13 ENCOUNTER — Other Ambulatory Visit: Payer: Self-pay | Admitting: Cardiology

## 2022-04-13 DIAGNOSIS — R55 Syncope and collapse: Secondary | ICD-10-CM

## 2022-04-23 DIAGNOSIS — M5136 Other intervertebral disc degeneration, lumbar region: Secondary | ICD-10-CM | POA: Diagnosis not present

## 2022-04-23 DIAGNOSIS — G8929 Other chronic pain: Secondary | ICD-10-CM | POA: Diagnosis not present

## 2022-04-23 DIAGNOSIS — Z79899 Other long term (current) drug therapy: Secondary | ICD-10-CM | POA: Diagnosis not present

## 2022-04-23 DIAGNOSIS — I10 Essential (primary) hypertension: Secondary | ICD-10-CM | POA: Diagnosis not present

## 2022-04-23 DIAGNOSIS — M549 Dorsalgia, unspecified: Secondary | ICD-10-CM | POA: Diagnosis not present

## 2022-04-26 DIAGNOSIS — Z79899 Other long term (current) drug therapy: Secondary | ICD-10-CM | POA: Diagnosis not present

## 2022-05-21 ENCOUNTER — Other Ambulatory Visit: Payer: Self-pay | Admitting: Cardiology

## 2022-05-21 DIAGNOSIS — M549 Dorsalgia, unspecified: Secondary | ICD-10-CM | POA: Diagnosis not present

## 2022-05-21 DIAGNOSIS — M5136 Other intervertebral disc degeneration, lumbar region: Secondary | ICD-10-CM | POA: Diagnosis not present

## 2022-05-21 DIAGNOSIS — G8929 Other chronic pain: Secondary | ICD-10-CM | POA: Diagnosis not present

## 2022-05-21 DIAGNOSIS — Z79899 Other long term (current) drug therapy: Secondary | ICD-10-CM | POA: Diagnosis not present

## 2022-05-21 DIAGNOSIS — I10 Essential (primary) hypertension: Secondary | ICD-10-CM

## 2022-05-25 DIAGNOSIS — Z79899 Other long term (current) drug therapy: Secondary | ICD-10-CM | POA: Diagnosis not present

## 2022-06-12 DIAGNOSIS — G8929 Other chronic pain: Secondary | ICD-10-CM | POA: Diagnosis not present

## 2022-06-12 DIAGNOSIS — M5136 Other intervertebral disc degeneration, lumbar region: Secondary | ICD-10-CM | POA: Diagnosis not present

## 2022-06-12 DIAGNOSIS — M549 Dorsalgia, unspecified: Secondary | ICD-10-CM | POA: Diagnosis not present

## 2022-06-12 DIAGNOSIS — E7800 Pure hypercholesterolemia, unspecified: Secondary | ICD-10-CM | POA: Diagnosis not present

## 2022-06-12 DIAGNOSIS — Z79899 Other long term (current) drug therapy: Secondary | ICD-10-CM | POA: Diagnosis not present

## 2022-06-13 DIAGNOSIS — F439 Reaction to severe stress, unspecified: Secondary | ICD-10-CM | POA: Diagnosis not present

## 2022-06-13 DIAGNOSIS — Z79899 Other long term (current) drug therapy: Secondary | ICD-10-CM | POA: Diagnosis not present

## 2022-06-13 DIAGNOSIS — F902 Attention-deficit hyperactivity disorder, combined type: Secondary | ICD-10-CM | POA: Diagnosis not present

## 2022-06-13 DIAGNOSIS — F411 Generalized anxiety disorder: Secondary | ICD-10-CM | POA: Diagnosis not present

## 2022-06-14 DIAGNOSIS — Z79899 Other long term (current) drug therapy: Secondary | ICD-10-CM | POA: Diagnosis not present

## 2022-07-04 DIAGNOSIS — L309 Dermatitis, unspecified: Secondary | ICD-10-CM | POA: Diagnosis not present

## 2022-07-05 DIAGNOSIS — L308 Other specified dermatitis: Secondary | ICD-10-CM | POA: Diagnosis not present

## 2022-07-14 ENCOUNTER — Other Ambulatory Visit: Payer: Self-pay | Admitting: Cardiology

## 2022-07-14 DIAGNOSIS — R55 Syncope and collapse: Secondary | ICD-10-CM

## 2022-07-17 DIAGNOSIS — M3390 Dermatopolymyositis, unspecified, organ involvement unspecified: Secondary | ICD-10-CM | POA: Diagnosis not present

## 2022-07-17 DIAGNOSIS — G8929 Other chronic pain: Secondary | ICD-10-CM | POA: Diagnosis not present

## 2022-07-17 DIAGNOSIS — M549 Dorsalgia, unspecified: Secondary | ICD-10-CM | POA: Diagnosis not present

## 2022-07-17 DIAGNOSIS — M5136 Other intervertebral disc degeneration, lumbar region: Secondary | ICD-10-CM | POA: Diagnosis not present

## 2022-07-17 DIAGNOSIS — Z79899 Other long term (current) drug therapy: Secondary | ICD-10-CM | POA: Diagnosis not present

## 2022-07-19 ENCOUNTER — Ambulatory Visit: Payer: BC Managed Care – PPO | Admitting: Cardiology

## 2022-07-20 DIAGNOSIS — M79643 Pain in unspecified hand: Secondary | ICD-10-CM | POA: Diagnosis not present

## 2022-07-20 DIAGNOSIS — M1990 Unspecified osteoarthritis, unspecified site: Secondary | ICD-10-CM | POA: Diagnosis not present

## 2022-07-20 DIAGNOSIS — M797 Fibromyalgia: Secondary | ICD-10-CM | POA: Diagnosis not present

## 2022-07-20 DIAGNOSIS — M151 Heberden's nodes (with arthropathy): Secondary | ICD-10-CM | POA: Diagnosis not present

## 2022-07-20 DIAGNOSIS — M2550 Pain in unspecified joint: Secondary | ICD-10-CM | POA: Diagnosis not present

## 2022-08-02 DIAGNOSIS — M3390 Dermatopolymyositis, unspecified, organ involvement unspecified: Secondary | ICD-10-CM | POA: Diagnosis not present

## 2022-08-02 DIAGNOSIS — Z79899 Other long term (current) drug therapy: Secondary | ICD-10-CM | POA: Diagnosis not present

## 2022-08-02 DIAGNOSIS — R21 Rash and other nonspecific skin eruption: Secondary | ICD-10-CM | POA: Diagnosis not present

## 2022-08-02 DIAGNOSIS — Z0000 Encounter for general adult medical examination without abnormal findings: Secondary | ICD-10-CM | POA: Diagnosis not present

## 2022-08-02 DIAGNOSIS — M797 Fibromyalgia: Secondary | ICD-10-CM | POA: Diagnosis not present

## 2022-08-02 DIAGNOSIS — M1990 Unspecified osteoarthritis, unspecified site: Secondary | ICD-10-CM | POA: Diagnosis not present

## 2022-08-07 ENCOUNTER — Other Ambulatory Visit: Payer: Self-pay | Admitting: Rheumatology

## 2022-08-07 DIAGNOSIS — M3390 Dermatopolymyositis, unspecified, organ involvement unspecified: Secondary | ICD-10-CM

## 2022-08-19 IMAGING — US US BREAST*R* LIMITED INC AXILLA
1 series · 2 of 2 positions shown · non-contrast
Comparison: None.

CLINICAL DATA: Patient complains of a palpable right breast mass.

EXAM:
DIGITAL DIAGNOSTIC BILATERAL MAMMOGRAM WITH IMPLANTS, CAD AND TOMO
ULTRASOUND BILATERAL BREAST
The patient has retropectoral implants. Standard and implant
displaced views were performed.

[Series 1: us breast*right* limited inc axilla · 0.07mm/px · 2 of 2 slices shown]
[im 1/2]
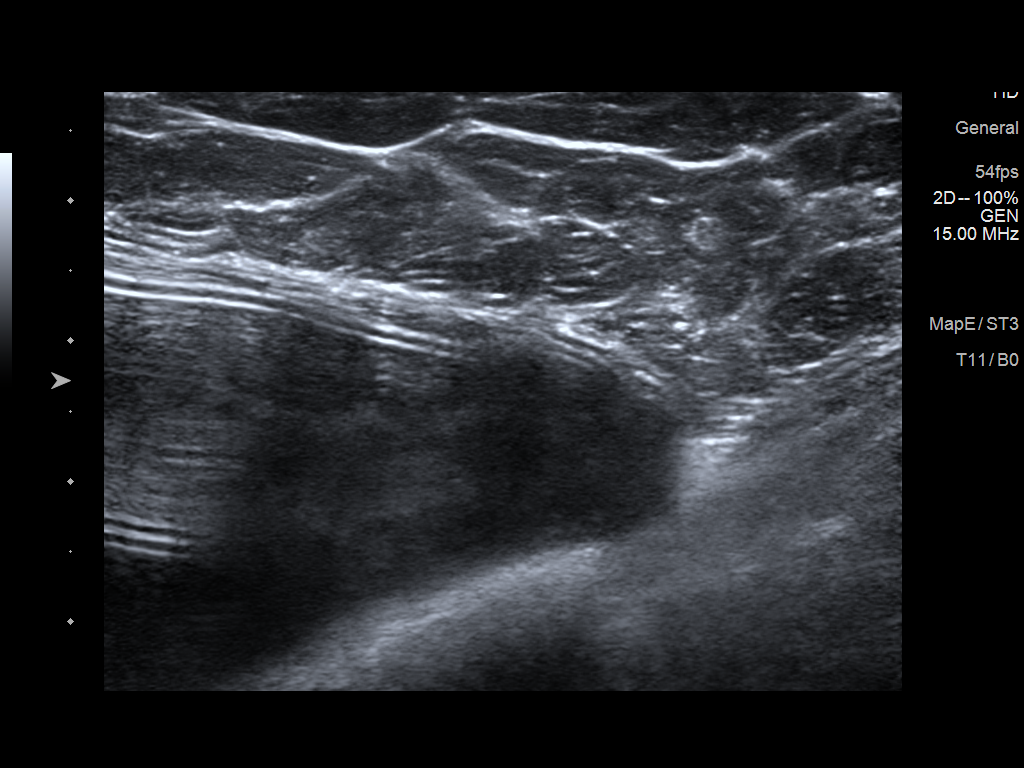
[im 2/2]
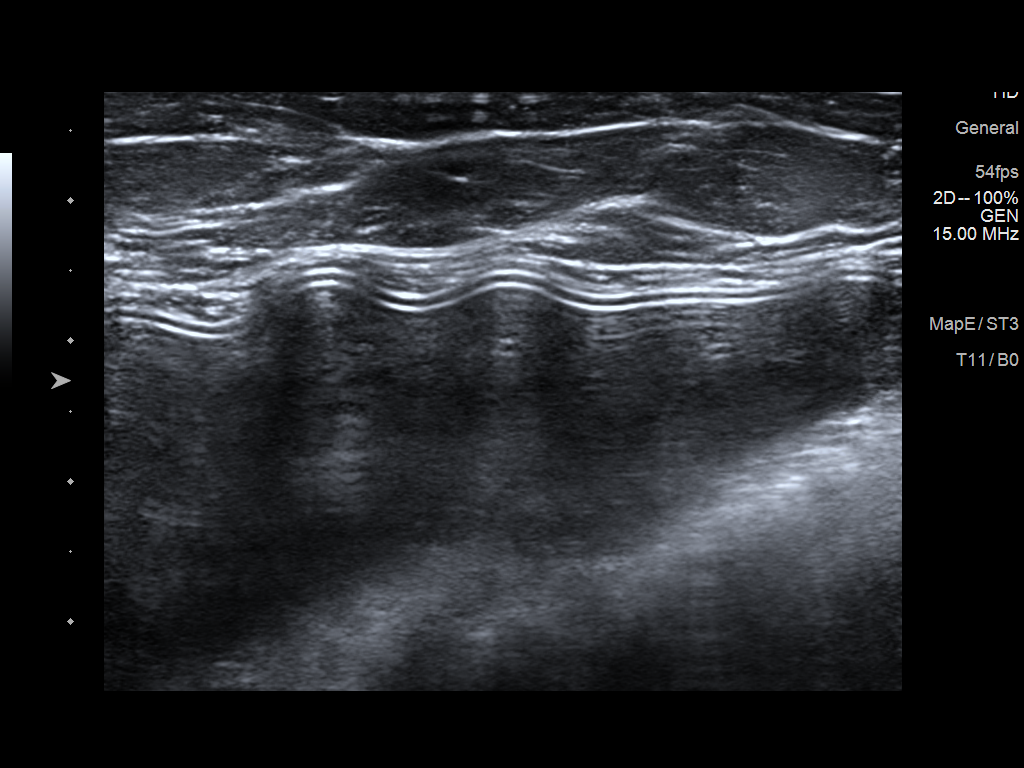

[2 of 2 positions shown; findings below may reference images not displayed]

ACR Breast Density Category b: There are scattered areas of
fibroglandular density.
FINDINGS: No suspicious mass or malignant type microcalcifications identified
in the right breast. Spot tangential view of the area of clinical
concern shows normal fibroglandular tissue. Asymmetry in the
superior aspect of the left breast disperses on additional imaging.
No suspicious mass or malignant type microcalcifications identified
in the left breast.

Mammographic images were processed with CAD.

On physical exam, I do not palpate a mass in the area of clinical
concern in the 4 o'clock region of the right breast. I do not
palpate a mass in the upper retroareolar region of the left breast.

Targeted ultrasound is performed, showing normal tissue in the right
breast at 4 o'clock 3 cm from the nipple. Normal tissue is seen in
the upper-outer quadrant of the left breast and retroareolar region.
IMPRESSION: No evidence of malignancy in either breast.

RECOMMENDATION:
Bilateral screening mammogram in 1 year is recommended.

I have discussed the findings and recommendations with the patient.
If applicable, a reminder letter will be sent to the patient
regarding the next appointment.

BI-RADS CATEGORY  1: Negative.

## 2022-08-19 IMAGING — MG MM  DIGITAL DIAGNOSTIC BREAST BILAT IMPLANT W/ TOMO W/ CAD
8 of 17 series · 8 of 40 positions shown · non-contrast
Comparison: None.

CLINICAL DATA: Patient complains of a palpable right breast mass.

EXAM:
DIGITAL DIAGNOSTIC BILATERAL MAMMOGRAM WITH IMPLANTS, CAD AND TOMO
ULTRASOUND BILATERAL BREAST
The patient has retropectoral implants. Standard and implant
displaced views were performed.

[L CC]
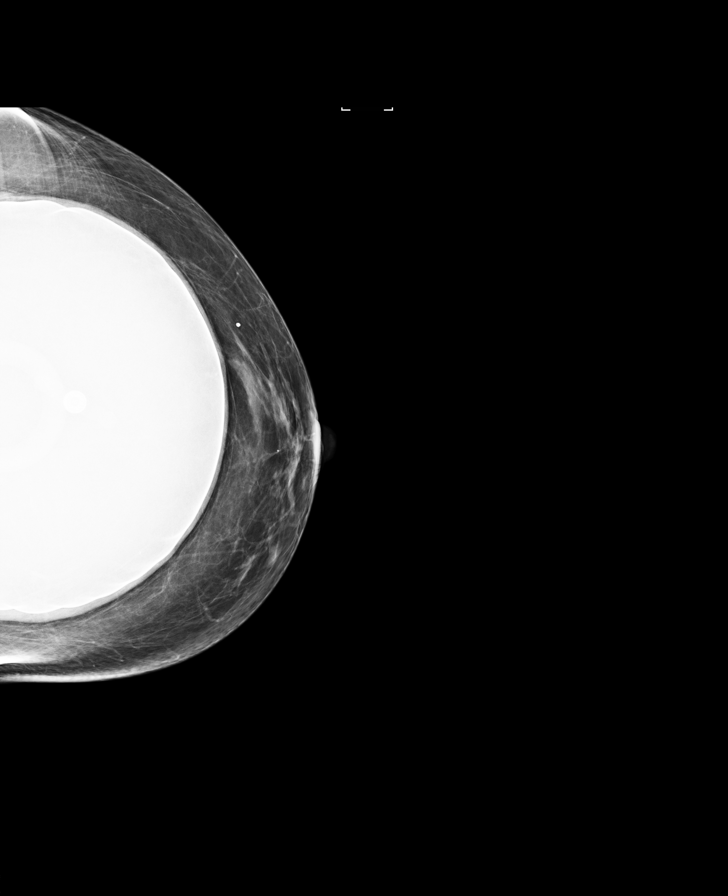

[R MLO]
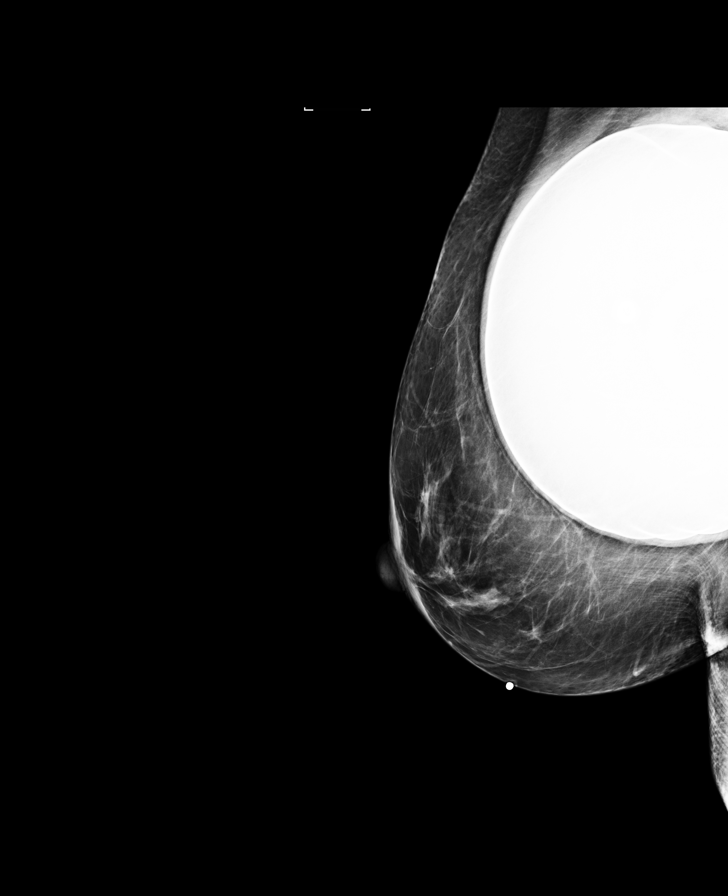

[L MLO]
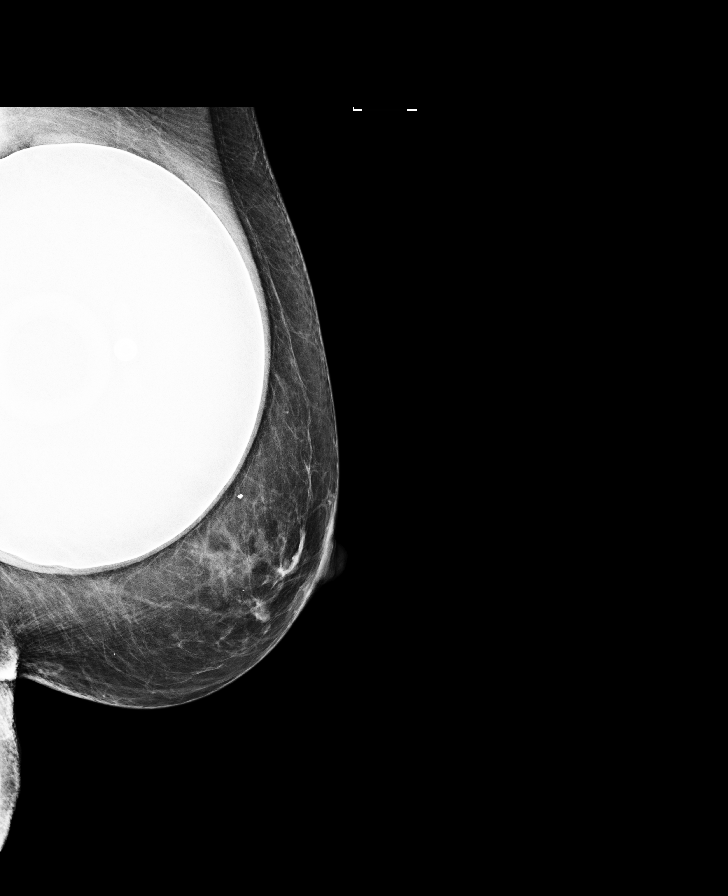

[R CC]
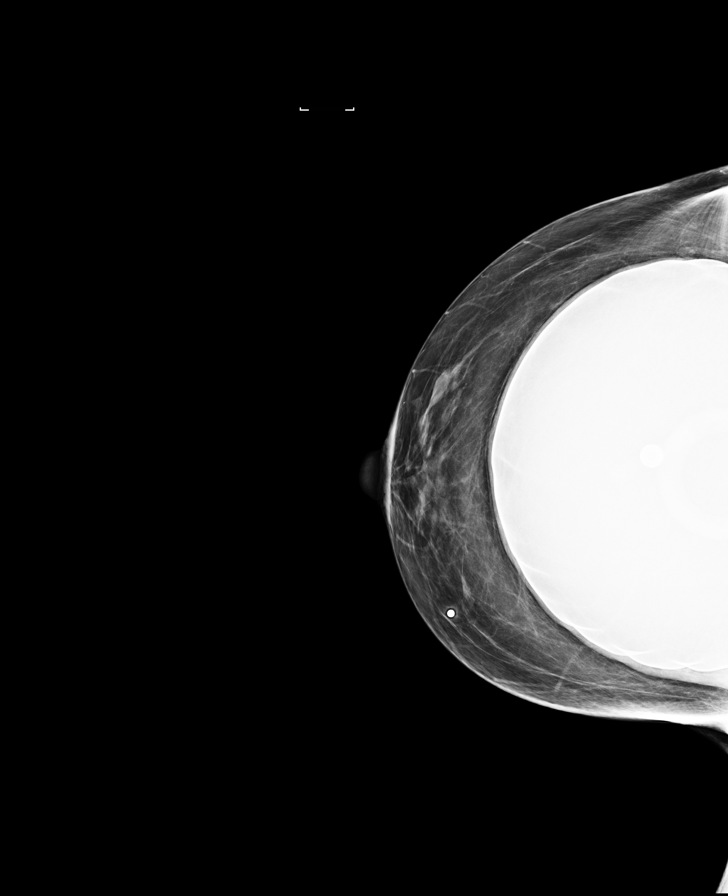

[R TAN synth-2D]
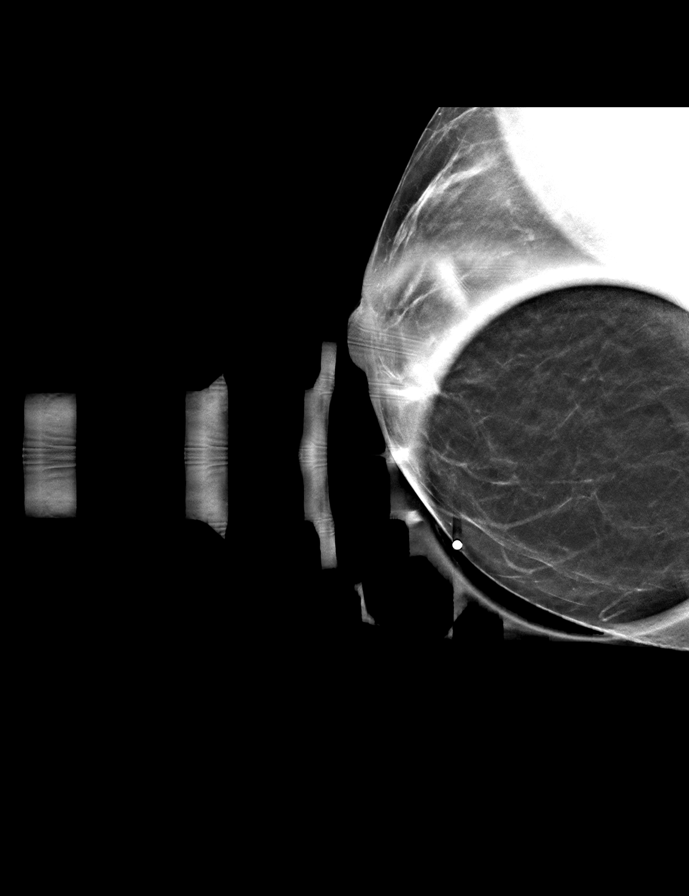

[L CC synth-2D]
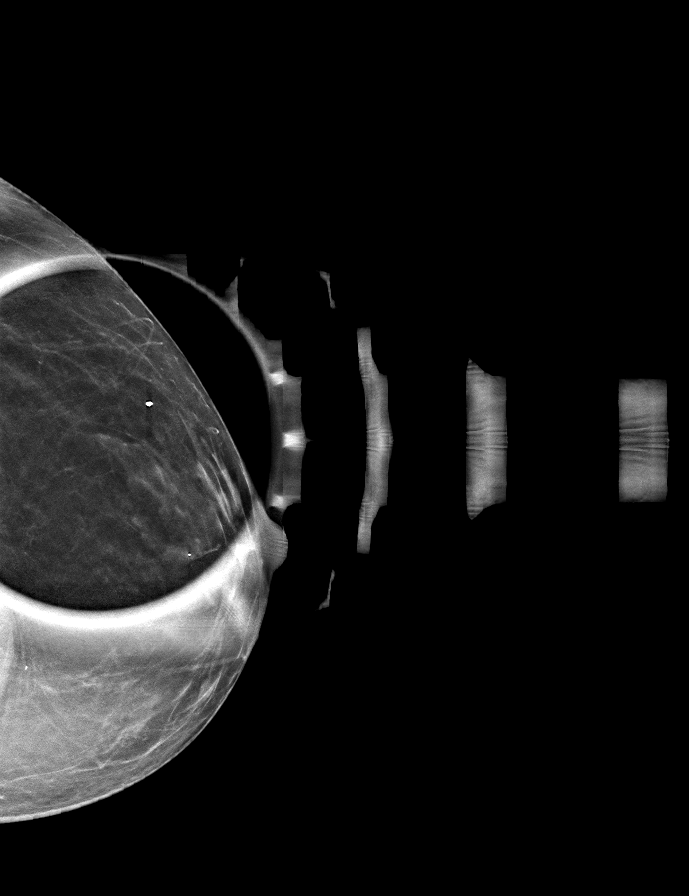

[L MLO synth-2D]
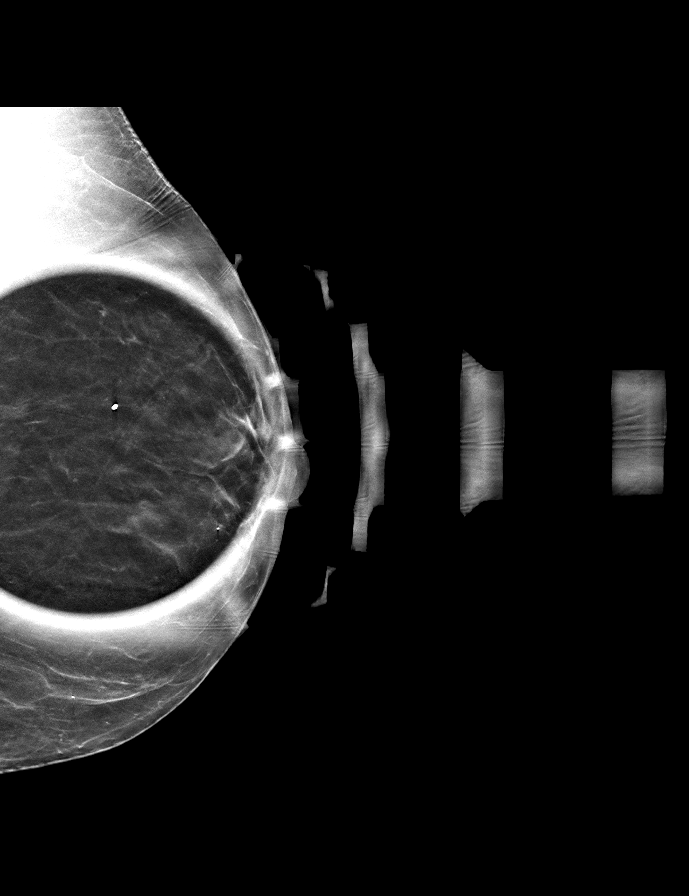

[R MLO synth-2D]
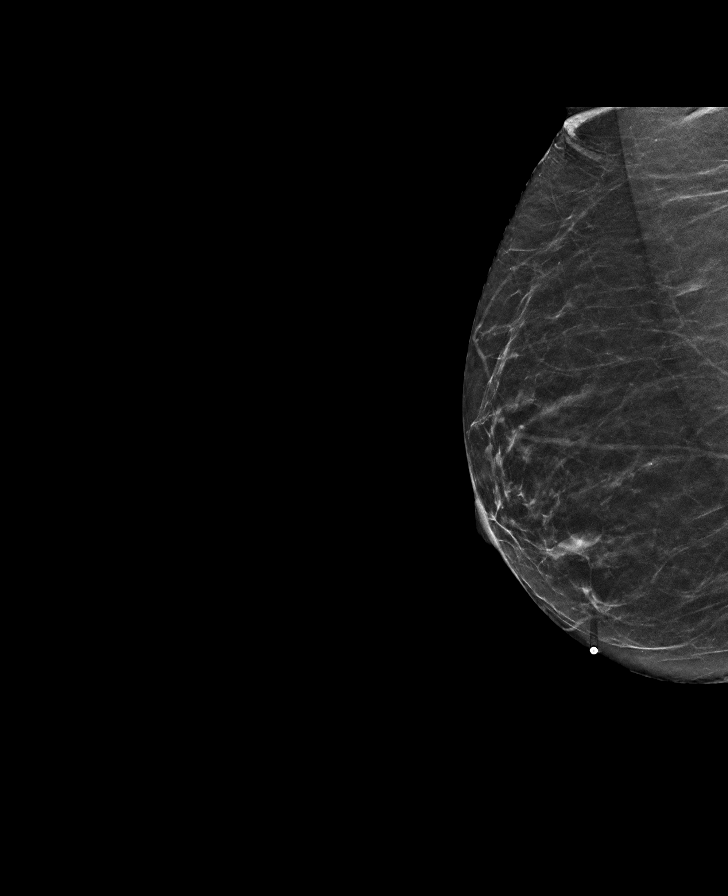

[8 of 40 positions shown; findings below may reference images not displayed]

ACR Breast Density Category b: There are scattered areas of
fibroglandular density.
FINDINGS: No suspicious mass or malignant type microcalcifications identified
in the right breast. Spot tangential view of the area of clinical
concern shows normal fibroglandular tissue. Asymmetry in the
superior aspect of the left breast disperses on additional imaging.
No suspicious mass or malignant type microcalcifications identified
in the left breast.

Mammographic images were processed with CAD.

On physical exam, I do not palpate a mass in the area of clinical
concern in the 4 o'clock region of the right breast. I do not
palpate a mass in the upper retroareolar region of the left breast.

Targeted ultrasound is performed, showing normal tissue in the right
breast at 4 o'clock 3 cm from the nipple. Normal tissue is seen in
the upper-outer quadrant of the left breast and retroareolar region.
IMPRESSION: No evidence of malignancy in either breast.

RECOMMENDATION:
Bilateral screening mammogram in 1 year is recommended.

I have discussed the findings and recommendations with the patient.
If applicable, a reminder letter will be sent to the patient
regarding the next appointment.

BI-RADS CATEGORY  1: Negative.

## 2022-08-24 DIAGNOSIS — M549 Dorsalgia, unspecified: Secondary | ICD-10-CM | POA: Diagnosis not present

## 2022-08-24 DIAGNOSIS — M5136 Other intervertebral disc degeneration, lumbar region: Secondary | ICD-10-CM | POA: Diagnosis not present

## 2022-08-24 DIAGNOSIS — M546 Pain in thoracic spine: Secondary | ICD-10-CM | POA: Diagnosis not present

## 2022-08-24 DIAGNOSIS — Z79899 Other long term (current) drug therapy: Secondary | ICD-10-CM | POA: Diagnosis not present

## 2022-08-27 DIAGNOSIS — E039 Hypothyroidism, unspecified: Secondary | ICD-10-CM | POA: Diagnosis not present

## 2022-08-27 DIAGNOSIS — E559 Vitamin D deficiency, unspecified: Secondary | ICD-10-CM | POA: Diagnosis not present

## 2022-08-27 DIAGNOSIS — Z0000 Encounter for general adult medical examination without abnormal findings: Secondary | ICD-10-CM | POA: Diagnosis not present

## 2022-08-28 DIAGNOSIS — M546 Pain in thoracic spine: Secondary | ICD-10-CM | POA: Diagnosis not present

## 2022-08-28 DIAGNOSIS — M549 Dorsalgia, unspecified: Secondary | ICD-10-CM | POA: Diagnosis not present

## 2022-08-29 ENCOUNTER — Ambulatory Visit: Payer: BC Managed Care – PPO | Admitting: Cardiology

## 2022-08-29 ENCOUNTER — Encounter: Payer: Self-pay | Admitting: Cardiology

## 2022-08-29 VITALS — BP 140/76 | HR 75 | Temp 97.2°F | Resp 16 | Ht 65.0 in | Wt 144.0 lb

## 2022-08-29 DIAGNOSIS — R600 Localized edema: Secondary | ICD-10-CM

## 2022-08-29 DIAGNOSIS — G901 Familial dysautonomia [Riley-Day]: Secondary | ICD-10-CM | POA: Diagnosis not present

## 2022-08-29 DIAGNOSIS — I10 Essential (primary) hypertension: Secondary | ICD-10-CM | POA: Diagnosis not present

## 2022-08-29 DIAGNOSIS — I951 Orthostatic hypotension: Secondary | ICD-10-CM | POA: Diagnosis not present

## 2022-08-29 DIAGNOSIS — Z79899 Other long term (current) drug therapy: Secondary | ICD-10-CM | POA: Diagnosis not present

## 2022-08-29 MED ORDER — SPIRONOLACTONE 50 MG PO TABS: 50.0000 mg | ORAL_TABLET | Freq: Every day | ORAL | 3 refills | Status: DC

## 2022-08-29 NOTE — Progress Notes (Signed)
Primary Physician/Referring:  Deland Pretty, MD  Patient ID: Sharon Huffman, female    DOB: 10-30-1961, 61 y.o.   MRN: [id number]  Chief Complaint  Patient presents with   orthostatic hypotension   supine hypotension   Follow-up    1 year   HPI:    Sharon Huffman  is a 61 y.o.   female with  family history of premature coronary artery disease in her father had CAD at 50 Y and died at 37 Years of age, tobacco use quit in Dec 2019, 15-20 pack year history, hypertension and hyperlipidemia, mild coronary artery disease by coronary CTA on 06/08/2019 with mid LAD 50% stenosis CT FFR negative for significant stenosis.  She has now been diagnosed with dermatomyositis and is being followed by rheumatology.  She follows Dr. Lahoma Rocker.   She also has dysautonomia with episodes of dysautonomia, hypothermia, hyperthermia, excessive sweating that unknown times, weight gain and weight loss, orthostatic hypotension, palpitations, fatigue and arthritis.  Her main complaint today is mild generalized weakness, fatigue and generalized body aches.  She is now on Imuran and also has been started on MS Contin for pain.  Past Medical History:  Diagnosis Date   ADHD    Anxiety    Bronchitis, chronic (HCC)    Cervical cancer (HCC)    Depression    Depression    Hyperlipidemia    Hypertension    Insomnia disorder    Lyme disease    Migraine    Ovarian cancer ([place])    Palpitations      Social History   Tobacco Use   Smoking status: Former    Packs/day: 0.50    Years: 30.00    Total pack years: 15.00    Types: Cigarettes    Start date: 12/18/1975    Quit date: 11/27/2018    Years since quitting: 3.7   Smokeless tobacco: Never  Substance Use Topics   Alcohol use: Not Currently    Alcohol/week: 7.0 standard drinks of alcohol    Types: 7 Glasses of wine per week    Comment: Quit 2020.   Marital Status: Married  ROS  Review of Systems  Constitutional: Positive for malaise/fatigue.   Cardiovascular:  Negative for chest pain, dyspnea on exertion and leg swelling.  Musculoskeletal:  Positive for arthritis.  Gastrointestinal:  Negative for melena.  Psychiatric/Behavioral:  Positive for depression. Negative for suicidal ideas.    Objective  Blood pressure (!) 140/76, pulse 75, temperature (!) 97.2 F (36.2 C), temperature source Temporal, resp. rate 16, height 5' 5"  (1.651 m), weight 144 lb (65.3 kg), SpO2 99 %.     08/29/2022    2:55 PM 07/19/2021    3:41 PM 01/05/2021    3:28 PM  Vitals with BMI  Height 5' 5"  5' 5"    Weight 144 lbs 173 lbs 10 oz   BMI 62.13 08.65   Systolic 784 696 295  Diastolic 76 68 62  Pulse 75 90 85    Orthostatic VS for the past 72 hrs (Last 3 readings):  Orthostatic BP Patient Position BP Location Cuff Size Orthostatic Pulse  08/29/22 1505 132/77 Standing Left Arm Normal 78  08/29/22 1504 134/75 Sitting Left Arm Normal 70  08/29/22 1503 137/80 Supine Left Arm Normal 75     Physical Exam Vitals reviewed.  Constitutional:      General: She is not in acute distress.    Appearance: She is well-developed.  Cardiovascular:     Rate  and Rhythm: Normal rate and regular rhythm. No extrasystoles are present.    Pulses: Intact distal pulses.     Heart sounds: S1 normal and S2 normal. No murmur heard.    No gallop.     Comments: No leg edema, no JVD.  Pulmonary:     Effort: Pulmonary effort is normal. No accessory muscle usage or respiratory distress.     Breath sounds: Normal breath sounds. No wheezing, rhonchi or rales.  Abdominal:     General: Bowel sounds are normal.     Palpations: Abdomen is soft.  Musculoskeletal:     Right lower leg: No edema.     Left lower leg: No edema.    Laboratory examination:   External labs:   Labs 08/02/2022:  Total cholesterol 173, triglycerides 110, HDL 81, LDL 70.  Hb 13.3/HCT 40.4, platelets 250, normal indicis.  BUN 20, creatinine 0.88, EGFR 70 mL, potassium 3.6.  LFTs normal.  Total CK2  127.  CRP 0.1.  03/09/2014: Normal CBC, CMP. Total cholesterol 254, triglycerides 208, HDL 51, LDL 161. Non-HDL cholesterol 20 3.  Medications and allergies   Allergies  Allergen Reactions   Amlodipine Swelling and Other (See Comments)    Leg edema and fluid retention   Amoxicillin-Pot Clavulanate Nausea Only   Atorvastatin     Other reaction(s): back ache   Bystolic [Nebivolol Hcl] Other (See Comments)    sweat and get clammy   Carbamazepine     Other reaction(s): rash   Mirtazapine     Other reaction(s): edema, vivid nightmares   Sulfacetamide Nausea Only   Erythromycin Nausea And Vomiting     Medications after today's encounter  Current Outpatient Medications:    acebutolol (SECTRAL) 200 MG capsule, Take 1 capsule (200 mg total) by mouth daily., Disp: 90 capsule, Rfl: 2   ALPRAZolam (XANAX) 1 MG tablet, Take 1 mg by mouth at bedtime., Disp: , Rfl:    amphetamine-dextroamphetamine (ADDERALL XR) 30 MG 24 hr capsule, Take 30 mg by mouth at bedtime., Disp: , Rfl:    azaTHIOprine (IMURAN) 50 MG tablet, Take 50 mg by mouth daily., Disp: , Rfl:    Biotin 10 MG CAPS, Take by mouth daily., Disp: , Rfl:    clobetasol cream (TEMOVATE) 0.05 %, Apply topically., Disp: , Rfl:    desonide (DESOWEN) 0.05 % cream, Apply topically 2 (two) times daily as needed., Disp: , Rfl:    famotidine (PEPCID) 20 MG tablet, One after supper, Disp: 30 tablet, Rfl: 11   FLUoxetine (PROZAC) 40 MG capsule, Take 40 mg by mouth at bedtime., Disp: , Rfl:    HYDROcodone-acetaminophen (NORCO) 7.5-325 MG tablet, Take 1 tablet by mouth 4 (four) times daily as needed., Disp: , Rfl:    hydrOXYzine (ATARAX) 25 MG tablet, Take 25 mg by mouth daily., Disp: , Rfl:    ipratropium (ATROVENT) 0.06 % nasal spray, Place 2 sprays into both nostrils as needed., Disp: , Rfl:    KLOR-CON M20 20 MEQ tablet, Take 20 mEq by mouth daily as needed. Take with furosemide only, Disp: , Rfl:    levothyroxine (SYNTHROID) 50 MCG tablet, Take  50 mcg by mouth daily before breakfast., Disp: , Rfl:    methylPREDNISolone (MEDROL) 4 MG tablet, Take 2 tablets by mouth daily., Disp: , Rfl:    midodrine (PROAMATINE) 5 MG tablet, USE AS DIRECTED, Disp: 270 tablet, Rfl: 0   morphine (MS CONTIN) 15 MG 12 hr tablet, Take 15 mg by mouth 2 (two) times  daily., Disp: , Rfl:    pantoprazole (PROTONIX) 40 MG tablet, TAKE 1 TABLET (40 MG TOTAL) BY MOUTH DAILY. TAKE 30-60 MIN BEFORE FIRST MEAL OF THE DAY, Disp: 30 tablet, Rfl: 0   rosuvastatin (CRESTOR) 10 MG tablet, Take 10 mg by mouth daily., Disp: , Rfl:    tiZANidine (ZANAFLEX) 2 MG tablet, Take 2 mg by mouth daily as needed., Disp: , Rfl:    triamcinolone cream (KENALOG) 0.1 %, SMARTSIG:Topical 1-2 Times Daily PRN, Disp: , Rfl:    furosemide (LASIX) 20 MG tablet, Take 1 tablet (20 mg total) by mouth daily as needed for edema or fluid., Disp: 90 tablet, Rfl: 3   spironolactone (ALDACTONE) 50 MG tablet, Take 1 tablet (50 mg total) by mouth daily., Disp: 90 tablet, Rfl: 3    Radiology:   Coronary CT angiogram 06/08/2019: Coronary calcium score 291.  Moderate long plaque in the proximal LAD, 25 to 49% stenosis, CT FFR plus.  Minimal plaque in other vessels.  Mild calcification of the aortic valve. CT FFR analysis didn't show any significant stenosis.   Cardiac Studies:   Echocardiogram 06/01/2019:  Normal LV systolic function with EF 56%. Left ventricle cavity is normal  in size. Mild concentric hypertrophy of the left ventricle. Normal global  wall motion. Normal diastolic filling pattern. Calculated EF 56%.  Aneurysmal interatrial septum with possible PFO.  Mild (Grade I) mitral regurgitation.  IVC is dilated with respiratory variation.  No evidence of pulmonary hypertension.  Lexiscan/modified Bruce Sestamibi stress test 06/15/2020: Lexiscan/modified Bruce nuclear stress test performed using 1-day protocol. protocol. Stress EKG is non-diagnostic, as this is pharmacological stress test. In  addition, stress EKG at 74% MPHR showed sinus tachycardia, no ST-T abnormalities.  Normal myocardial perfusion. Stress LVEF 60%. Low risk study.  EKG   EKG 08/29/2022: Normal sinus rhythm at rate of 68 bpm, left atrial enlargement, normal axis.  No evidence of ischemia, normal EKG. No significant change from 07/19/2021. xis.  No evidence of ischemia, otherwise normal EKG. No significant change from 01/05/2021:  Assessment     ICD-10-CM   1. Orthostatic hypotension  I95.1 EKG 12-Lead    2. Supine hypertension  I10 spironolactone (ALDACTONE) 50 MG tablet    3. Dysautonomia ([place])  G90.1     4. Bilateral leg edema  R60.0 spironolactone (ALDACTONE) 50 MG tablet    furosemide (LASIX) 20 MG tablet      Meds ordered this encounter  Medications   spironolactone (ALDACTONE) 50 MG tablet    Sig: Take 1 tablet (50 mg total) by mouth daily.    Dispense:  90 tablet    Refill:  3   Medications Discontinued During This Encounter  Medication Reason   gabapentin (NEURONTIN) 100 MG capsule    hydroxychloroquine (PLAQUENIL) 200 MG tablet    nitroGLYCERIN (NITROSTAT) 0.4 MG SL tablet    traMADol (ULTRAM) 50 MG tablet    zolpidem (AMBIEN) 5 MG tablet    spironolactone (ALDACTONE) 25 MG tablet Reorder   furosemide (LASIX) 20 MG tablet      Recommendations:   CHANEQUA SPEES  is a 61 y.o. female with  family history of premature coronary artery disease in her father had CAD at 68 Y and died at 19 Years of age, tobacco use quit in Dec 2019, 15-20 pack year history, hypertension and hyperlipidemia, mild coronary artery disease by coronary CTA on 06/08/2019 with mid LAD 50% stenosis CT FFR negative for significant stenosis.  She has now been diagnosed  with dermatomyositis and is being followed by rheumatology.  She follows Dr. Lahoma Rocker.   She also has dysautonomia with episodes of dysautonomia, hypothermia, hyperthermia, excessive sweating that unknown times, weight gain and weight loss, orthostatic  hypotension, palpitations, fatigue and arthritis.  Fortunately from cardiac standpoint she has not had any further episodes of syncope or near syncope, weight has remained stable, no PND or orthopnea.  With addition of present medications, orthostatic hypotension symptoms have been stable and supine hypertension has been stable as well.  She continues to have episodes where she suddenly swells up and takes furosemide.  She also continues to have low potassium levels.  She is having difficulty in swallowing potassium pills.  I have increased the dose of spironolactone to 50 mg daily, advised her to take Lasix only on a as needed basis along with potassium only on a as needed basis for marked leg edema or excess weight gain.  She has already been scheduled for labs tomorrow but I advised her to do the labs to follow-up on potassium levels in about 10 days to 2 weeks.  Otherwise stable from cardiac standpoint I will see her back on annual basis.    Adrian Prows, PA-C 08/29/2022, 3:41 PM Office: [phone]

## 2022-09-02 ENCOUNTER — Other Ambulatory Visit: Payer: Self-pay | Admitting: Cardiology

## 2022-09-02 DIAGNOSIS — I10 Essential (primary) hypertension: Secondary | ICD-10-CM

## 2022-09-05 ENCOUNTER — Ambulatory Visit
Admission: RE | Admit: 2022-09-05 | Discharge: 2022-09-05 | Disposition: A | Discharge status: Home / Self Care | Payer: BC Managed Care – PPO | Source: Ambulatory Visit | Attending: Rheumatology | Admitting: Rheumatology

## 2022-09-05 ENCOUNTER — Other Ambulatory Visit: Payer: BC Managed Care – PPO

## 2022-09-05 DIAGNOSIS — J9811 Atelectasis: Secondary | ICD-10-CM | POA: Diagnosis not present

## 2022-09-05 DIAGNOSIS — K838 Other specified diseases of biliary tract: Secondary | ICD-10-CM | POA: Diagnosis not present

## 2022-09-05 DIAGNOSIS — K828 Other specified diseases of gallbladder: Secondary | ICD-10-CM | POA: Diagnosis not present

## 2022-09-05 DIAGNOSIS — I2510 Atherosclerotic heart disease of native coronary artery without angina pectoris: Secondary | ICD-10-CM | POA: Diagnosis not present

## 2022-09-05 DIAGNOSIS — I700 Atherosclerosis of aorta: Secondary | ICD-10-CM | POA: Diagnosis not present

## 2022-09-05 DIAGNOSIS — M3390 Dermatopolymyositis, unspecified, organ involvement unspecified: Secondary | ICD-10-CM

## 2022-09-05 DIAGNOSIS — R140 Abdominal distension (gaseous): Secondary | ICD-10-CM | POA: Diagnosis not present

## 2022-09-05 DIAGNOSIS — K7689 Other specified diseases of liver: Secondary | ICD-10-CM | POA: Diagnosis not present

## 2022-09-05 MED ORDER — IOPAMIDOL (ISOVUE-300) INJECTION 61%
100.0000 mL | Freq: Once | INTRAVENOUS | Status: AC | PRN
  Administered 2022-09-05: 100 mL via INTRAVENOUS

## 2022-09-18 ENCOUNTER — Other Ambulatory Visit: Payer: Self-pay | Admitting: Internal Medicine

## 2022-09-18 DIAGNOSIS — M5136 Other intervertebral disc degeneration, lumbar region: Secondary | ICD-10-CM | POA: Diagnosis not present

## 2022-09-18 DIAGNOSIS — M546 Pain in thoracic spine: Secondary | ICD-10-CM | POA: Diagnosis not present

## 2022-09-18 DIAGNOSIS — K838 Other specified diseases of biliary tract: Secondary | ICD-10-CM

## 2022-09-18 DIAGNOSIS — M549 Dorsalgia, unspecified: Secondary | ICD-10-CM | POA: Diagnosis not present

## 2022-09-18 DIAGNOSIS — Z23 Encounter for immunization: Secondary | ICD-10-CM | POA: Diagnosis not present

## 2022-09-18 DIAGNOSIS — Z79899 Other long term (current) drug therapy: Secondary | ICD-10-CM | POA: Diagnosis not present

## 2022-09-20 DIAGNOSIS — Z79899 Other long term (current) drug therapy: Secondary | ICD-10-CM | POA: Diagnosis not present

## 2022-10-10 ENCOUNTER — Other Ambulatory Visit: Payer: Self-pay | Admitting: Cardiology

## 2022-10-10 DIAGNOSIS — R55 Syncope and collapse: Secondary | ICD-10-CM

## 2022-10-11 ENCOUNTER — Other Ambulatory Visit: Payer: BC Managed Care – PPO

## 2022-10-29 ENCOUNTER — Other Ambulatory Visit: Payer: BC Managed Care – PPO

## 2022-11-02 ENCOUNTER — Other Ambulatory Visit: Payer: Managed Care, Other (non HMO)

## 2022-11-27 ENCOUNTER — Other Ambulatory Visit: Payer: BC Managed Care – PPO

## 2022-11-30 ENCOUNTER — Other Ambulatory Visit: Payer: Self-pay | Admitting: Cardiology

## 2022-11-30 DIAGNOSIS — R600 Localized edema: Secondary | ICD-10-CM

## 2023-02-03 ENCOUNTER — Encounter: Payer: Self-pay | Admitting: Cardiology

## 2023-02-04 ENCOUNTER — Other Ambulatory Visit: Payer: Self-pay

## 2023-02-04 DIAGNOSIS — R002 Palpitations: Secondary | ICD-10-CM

## 2023-02-04 DIAGNOSIS — I10 Essential (primary) hypertension: Secondary | ICD-10-CM

## 2023-02-04 MED ORDER — ACEBUTOLOL HCL 200 MG PO CAPS: 200.0000 mg | ORAL_CAPSULE | Freq: Every day | ORAL | 2 refills | Status: DC

## 2023-04-20 ENCOUNTER — Other Ambulatory Visit: Payer: Self-pay | Admitting: Cardiology

## 2023-04-20 DIAGNOSIS — R55 Syncope and collapse: Secondary | ICD-10-CM

## 2023-08-26 ENCOUNTER — Other Ambulatory Visit: Payer: Self-pay | Admitting: Cardiology

## 2023-08-26 ENCOUNTER — Other Ambulatory Visit: Payer: Self-pay | Admitting: Orthopedic Surgery

## 2023-08-26 DIAGNOSIS — R600 Localized edema: Secondary | ICD-10-CM

## 2023-08-26 DIAGNOSIS — M5432 Sciatica, left side: Secondary | ICD-10-CM

## 2023-08-26 DIAGNOSIS — I10 Essential (primary) hypertension: Secondary | ICD-10-CM

## 2023-08-26 DIAGNOSIS — M4316 Spondylolisthesis, lumbar region: Secondary | ICD-10-CM

## 2023-08-29 ENCOUNTER — Other Ambulatory Visit: Payer: Self-pay | Admitting: Cardiology

## 2023-08-29 DIAGNOSIS — I10 Essential (primary) hypertension: Secondary | ICD-10-CM

## 2023-08-29 DIAGNOSIS — R55 Syncope and collapse: Secondary | ICD-10-CM

## 2023-08-29 DIAGNOSIS — R002 Palpitations: Secondary | ICD-10-CM

## 2023-09-04 ENCOUNTER — Ambulatory Visit: Payer: Self-pay | Admitting: Cardiology

## 2023-09-05 ENCOUNTER — Ambulatory Visit: Payer: Self-pay | Admitting: Cardiology

## 2023-09-08 NOTE — Progress Notes (Signed)
Error-  Cancelled appointment

## 2023-10-02 ENCOUNTER — Ambulatory Visit: Payer: Self-pay | Admitting: Cardiology

## 2023-10-16 LAB — LAB REPORT - SCANNED: EGFR: 61

## 2023-10-21 ENCOUNTER — Other Ambulatory Visit: Payer: Self-pay | Admitting: Rheumatology

## 2023-10-21 DIAGNOSIS — M25562 Pain in left knee: Secondary | ICD-10-CM

## 2023-10-21 DIAGNOSIS — M79605 Pain in left leg: Secondary | ICD-10-CM

## 2023-10-21 DIAGNOSIS — M7989 Other specified soft tissue disorders: Secondary | ICD-10-CM

## 2023-10-30 ENCOUNTER — Inpatient Hospital Stay
Admission: RE | Admit: 2023-10-30 | Discharge: 2023-10-30 | Discharge status: Home / Self Care | Payer: Managed Care, Other (non HMO) | Source: Ambulatory Visit | Attending: Rheumatology | Admitting: Rheumatology

## 2023-10-30 DIAGNOSIS — M25562 Pain in left knee: Secondary | ICD-10-CM

## 2023-10-30 DIAGNOSIS — M7989 Other specified soft tissue disorders: Secondary | ICD-10-CM

## 2023-10-30 DIAGNOSIS — M79605 Pain in left leg: Secondary | ICD-10-CM

## 2023-12-05 ENCOUNTER — Other Ambulatory Visit: Payer: Self-pay

## 2023-12-05 DIAGNOSIS — R600 Localized edema: Secondary | ICD-10-CM

## 2023-12-05 MED ORDER — FUROSEMIDE 20 MG PO TABS: 20.0000 mg | ORAL_TABLET | Freq: Every day | ORAL | 2 refills | Status: DC | PRN

## 2024-02-21 ENCOUNTER — Ambulatory Visit: Payer: Managed Care, Other (non HMO) | Attending: Cardiology | Admitting: Cardiology

## 2024-02-23 ENCOUNTER — Encounter (HOSPITAL_COMMUNITY): Payer: Self-pay | Admitting: Emergency Medicine

## 2024-02-23 ENCOUNTER — Inpatient Hospital Stay (HOSPITAL_COMMUNITY)
Admission: EM | Admit: 2024-02-23 | Discharge: 2024-03-10 | DRG: 917 | Disposition: A | Discharge status: Other Acute Inpatient Hospital | Attending: Internal Medicine | Admitting: Internal Medicine

## 2024-02-23 ENCOUNTER — Emergency Department (HOSPITAL_COMMUNITY)

## 2024-02-23 ENCOUNTER — Other Ambulatory Visit: Payer: Self-pay

## 2024-02-23 DIAGNOSIS — Z634 Disappearance and death of family member: Secondary | ICD-10-CM

## 2024-02-23 DIAGNOSIS — R339 Retention of urine, unspecified: Secondary | ICD-10-CM | POA: Diagnosis not present

## 2024-02-23 DIAGNOSIS — J690 Pneumonitis due to inhalation of food and vomit: Secondary | ICD-10-CM | POA: Diagnosis present

## 2024-02-23 DIAGNOSIS — Z8249 Family history of ischemic heart disease and other diseases of the circulatory system: Secondary | ICD-10-CM

## 2024-02-23 DIAGNOSIS — E860 Dehydration: Secondary | ICD-10-CM | POA: Diagnosis not present

## 2024-02-23 DIAGNOSIS — E039 Hypothyroidism, unspecified: Secondary | ICD-10-CM

## 2024-02-23 DIAGNOSIS — G43909 Migraine, unspecified, not intractable, without status migrainosus: Secondary | ICD-10-CM | POA: Diagnosis present

## 2024-02-23 DIAGNOSIS — Z881 Allergy status to other antibiotic agents status: Secondary | ICD-10-CM

## 2024-02-23 DIAGNOSIS — F1120 Opioid dependence, uncomplicated: Secondary | ICD-10-CM | POA: Diagnosis present

## 2024-02-23 DIAGNOSIS — Z87891 Personal history of nicotine dependence: Secondary | ICD-10-CM

## 2024-02-23 DIAGNOSIS — M5431 Sciatica, right side: Secondary | ICD-10-CM

## 2024-02-23 DIAGNOSIS — T6592XA Toxic effect of unspecified substance, intentional self-harm, initial encounter: Secondary | ICD-10-CM | POA: Diagnosis not present

## 2024-02-23 DIAGNOSIS — R112 Nausea with vomiting, unspecified: Secondary | ICD-10-CM | POA: Insufficient documentation

## 2024-02-23 DIAGNOSIS — K580 Irritable bowel syndrome with diarrhea: Secondary | ICD-10-CM | POA: Diagnosis present

## 2024-02-23 DIAGNOSIS — E785 Hyperlipidemia, unspecified: Secondary | ICD-10-CM | POA: Diagnosis present

## 2024-02-23 DIAGNOSIS — J9601 Acute respiratory failure with hypoxia: Principal | ICD-10-CM | POA: Diagnosis present

## 2024-02-23 DIAGNOSIS — Z8543 Personal history of malignant neoplasm of ovary: Secondary | ICD-10-CM

## 2024-02-23 DIAGNOSIS — I129 Hypertensive chronic kidney disease with stage 1 through stage 4 chronic kidney disease, or unspecified chronic kidney disease: Secondary | ICD-10-CM | POA: Diagnosis present

## 2024-02-23 DIAGNOSIS — Z79899 Other long term (current) drug therapy: Secondary | ICD-10-CM

## 2024-02-23 DIAGNOSIS — J9602 Acute respiratory failure with hypercapnia: Secondary | ICD-10-CM | POA: Diagnosis present

## 2024-02-23 DIAGNOSIS — F909 Attention-deficit hyperactivity disorder, unspecified type: Secondary | ICD-10-CM | POA: Diagnosis present

## 2024-02-23 DIAGNOSIS — F411 Generalized anxiety disorder: Secondary | ICD-10-CM | POA: Diagnosis present

## 2024-02-23 DIAGNOSIS — Z9151 Personal history of suicidal behavior: Secondary | ICD-10-CM

## 2024-02-23 DIAGNOSIS — T50901A Poisoning by unspecified drugs, medicaments and biological substances, accidental (unintentional), initial encounter: Secondary | ICD-10-CM | POA: Diagnosis not present

## 2024-02-23 DIAGNOSIS — F32A Depression, unspecified: Secondary | ICD-10-CM | POA: Diagnosis present

## 2024-02-23 DIAGNOSIS — Z888 Allergy status to other drugs, medicaments and biological substances status: Secondary | ICD-10-CM

## 2024-02-23 DIAGNOSIS — N179 Acute kidney failure, unspecified: Secondary | ICD-10-CM | POA: Diagnosis present

## 2024-02-23 DIAGNOSIS — R338 Other retention of urine: Secondary | ICD-10-CM | POA: Clinically undetermined

## 2024-02-23 DIAGNOSIS — G894 Chronic pain syndrome: Secondary | ICD-10-CM | POA: Diagnosis present

## 2024-02-23 DIAGNOSIS — M4316 Spondylolisthesis, lumbar region: Secondary | ICD-10-CM

## 2024-02-23 DIAGNOSIS — Z820 Family history of epilepsy and other diseases of the nervous system: Secondary | ICD-10-CM

## 2024-02-23 DIAGNOSIS — Z781 Physical restraint status: Secondary | ICD-10-CM

## 2024-02-23 DIAGNOSIS — Z882 Allergy status to sulfonamides status: Secondary | ICD-10-CM

## 2024-02-23 DIAGNOSIS — M25711 Osteophyte, right shoulder: Secondary | ICD-10-CM | POA: Diagnosis present

## 2024-02-23 DIAGNOSIS — E876 Hypokalemia: Secondary | ICD-10-CM | POA: Diagnosis not present

## 2024-02-23 DIAGNOSIS — Z79890 Hormone replacement therapy (postmenopausal): Secondary | ICD-10-CM

## 2024-02-23 DIAGNOSIS — Z8349 Family history of other endocrine, nutritional and metabolic diseases: Secondary | ICD-10-CM

## 2024-02-23 DIAGNOSIS — E875 Hyperkalemia: Secondary | ICD-10-CM | POA: Diagnosis present

## 2024-02-23 DIAGNOSIS — F0283 Dementia in other diseases classified elsewhere, unspecified severity, with mood disturbance: Secondary | ICD-10-CM | POA: Diagnosis present

## 2024-02-23 DIAGNOSIS — M25712 Osteophyte, left shoulder: Secondary | ICD-10-CM | POA: Diagnosis present

## 2024-02-23 DIAGNOSIS — J189 Pneumonia, unspecified organism: Secondary | ICD-10-CM | POA: Diagnosis present

## 2024-02-23 DIAGNOSIS — N1831 Chronic kidney disease, stage 3a: Secondary | ICD-10-CM | POA: Diagnosis present

## 2024-02-23 DIAGNOSIS — I739 Peripheral vascular disease, unspecified: Secondary | ICD-10-CM | POA: Diagnosis present

## 2024-02-23 DIAGNOSIS — G8929 Other chronic pain: Secondary | ICD-10-CM | POA: Insufficient documentation

## 2024-02-23 DIAGNOSIS — Z8541 Personal history of malignant neoplasm of cervix uteri: Secondary | ICD-10-CM

## 2024-02-23 DIAGNOSIS — T50904A Poisoning by unspecified drugs, medicaments and biological substances, undetermined, initial encounter: Secondary | ICD-10-CM

## 2024-02-23 DIAGNOSIS — F332 Major depressive disorder, recurrent severe without psychotic features: Secondary | ICD-10-CM | POA: Diagnosis present

## 2024-02-23 LAB — I-STAT VENOUS BLOOD GAS, ED
Acid-Base Excess: 2 mmol/L (ref 0.0–2.0)
Bicarbonate: 29.8 mmol/L — ABNORMAL HIGH (ref 20.0–28.0)
Calcium, Ion: 1.2 mmol/L (ref 1.15–1.40)
HCT: 40 % (ref 36.0–46.0)
Hemoglobin: 13.6 g/dL (ref 12.0–15.0)
O2 Saturation: 93 %
Potassium: 5.3 mmol/L — ABNORMAL HIGH (ref 3.5–5.1)
Sodium: 138 mmol/L (ref 135–145)
TCO2: 32 mmol/L (ref 22–32)
pCO2, Ven: 58.5 mmHg (ref 44–60)
pH, Ven: 7.315 (ref 7.25–7.43)
pO2, Ven: 74 mmHg — ABNORMAL HIGH (ref 32–45)

## 2024-02-23 LAB — COMPREHENSIVE METABOLIC PANEL
ALT: 18 U/L (ref 0–44)
AST: 27 U/L (ref 15–41)
Albumin: 3.1 g/dL — ABNORMAL LOW (ref 3.5–5.0)
Alkaline Phosphatase: 56 U/L (ref 38–126)
Anion gap: 13 (ref 5–15)
BUN: 26 mg/dL — ABNORMAL HIGH (ref 8–23)
CO2: 23 mmol/L (ref 22–32)
Calcium: 9.1 mg/dL (ref 8.9–10.3)
Chloride: 102 mmol/L (ref 98–111)
Creatinine, Ser: 2.09 mg/dL — ABNORMAL HIGH (ref 0.44–1.00)
GFR, Estimated: 26 mL/min — ABNORMAL LOW (ref >60)
Glucose, Bld: 106 mg/dL — ABNORMAL HIGH (ref 70–99)
Potassium: 4.4 mmol/L (ref 3.5–5.1)
Sodium: 138 mmol/L (ref 135–145)
Total Bilirubin: 1 mg/dL (ref 0.0–1.2)
Total Protein: 5.5 g/dL — ABNORMAL LOW (ref 6.5–8.1)

## 2024-02-23 LAB — I-STAT CHEM 8, ED
BUN: 39 mg/dL — ABNORMAL HIGH (ref 8–23)
Calcium, Ion: 1.19 mmol/L (ref 1.15–1.40)
Chloride: 106 mmol/L (ref 98–111)
Creatinine, Ser: 2.7 mg/dL — ABNORMAL HIGH (ref 0.44–1.00)
Glucose, Bld: 109 mg/dL — ABNORMAL HIGH (ref 70–99)
HCT: 40 % (ref 36.0–46.0)
Hemoglobin: 13.6 g/dL (ref 12.0–15.0)
Potassium: 5.3 mmol/L — ABNORMAL HIGH (ref 3.5–5.1)
Sodium: 137 mmol/L (ref 135–145)
TCO2: 30 mmol/L (ref 22–32)

## 2024-02-23 LAB — URINALYSIS, W/ REFLEX TO CULTURE (INFECTION SUSPECTED)
Bacteria, UA: NONE SEEN
Bilirubin Urine: NEGATIVE
Glucose, UA: NEGATIVE mg/dL
Hgb urine dipstick: NEGATIVE
Ketones, ur: 5 mg/dL — AB
Leukocytes,Ua: NEGATIVE
Nitrite: NEGATIVE
Protein, ur: NEGATIVE mg/dL
Specific Gravity, Urine: 1.016 (ref 1.005–1.030)
pH: 5 (ref 5.0–8.0)

## 2024-02-23 LAB — CBC WITH DIFFERENTIAL/PLATELET
Abs Immature Granulocytes: 0.03 10*3/uL (ref 0.00–0.07)
Basophils Absolute: 0 10*3/uL (ref 0.0–0.1)
Basophils Relative: 0 %
Eosinophils Absolute: 0.1 10*3/uL (ref 0.0–0.5)
Eosinophils Relative: 1 %
HCT: 41.9 % (ref 36.0–46.0)
Hemoglobin: 13.2 g/dL (ref 12.0–15.0)
Immature Granulocytes: 0 %
Lymphocytes Relative: 4 %
Lymphs Abs: 0.5 10*3/uL — ABNORMAL LOW (ref 0.7–4.0)
MCH: 31.7 pg (ref 26.0–34.0)
MCHC: 31.5 g/dL (ref 30.0–36.0)
MCV: 100.7 fL — ABNORMAL HIGH (ref 80.0–100.0)
Monocytes Absolute: 0.6 10*3/uL (ref 0.1–1.0)
Monocytes Relative: 5 %
Neutro Abs: 10.1 10*3/uL — ABNORMAL HIGH (ref 1.7–7.7)
Neutrophils Relative %: 90 %
Platelets: 246 10*3/uL (ref 150–400)
RBC: 4.16 MIL/uL (ref 3.87–5.11)
RDW: 12.5 % (ref 11.5–15.5)
WBC: 11.3 10*3/uL — ABNORMAL HIGH (ref 4.0–10.5)
nRBC: 0 % (ref 0.0–0.2)

## 2024-02-23 LAB — PROTIME-INR
INR: 1 (ref 0.8–1.2)
Prothrombin Time: 13.6 s (ref 11.4–15.2)

## 2024-02-23 LAB — RESP PANEL BY RT-PCR (RSV, FLU A&B, COVID)  RVPGX2
Influenza A by PCR: NEGATIVE
Influenza B by PCR: NEGATIVE
Resp Syncytial Virus by PCR: NEGATIVE
SARS Coronavirus 2 by RT PCR: NEGATIVE

## 2024-02-23 LAB — RAPID URINE DRUG SCREEN, HOSP PERFORMED
Amphetamines: POSITIVE — AB
Barbiturates: NOT DETECTED
Benzodiazepines: POSITIVE — AB
Cocaine: NOT DETECTED
Opiates: POSITIVE — AB
Tetrahydrocannabinol: NOT DETECTED

## 2024-02-23 LAB — SALICYLATE LEVEL: Salicylate Lvl: <7 mg/dL — ABNORMAL LOW (ref 7.0–30.0)

## 2024-02-23 LAB — ACETAMINOPHEN LEVEL: Acetaminophen (Tylenol), Serum: <10 ug/mL — ABNORMAL LOW (ref 10–30)

## 2024-02-23 LAB — TROPONIN I (HIGH SENSITIVITY)
Troponin I (High Sensitivity): 9 ng/L (ref <18)
Troponin I (High Sensitivity): 11 ng/L (ref <18)

## 2024-02-23 LAB — ETHANOL: Alcohol, Ethyl (B): <10 mg/dL (ref <10)

## 2024-02-23 LAB — I-STAT CG4 LACTIC ACID, ED: Lactic Acid, Venous: 1.9 mmol/L (ref 0.5–1.9)

## 2024-02-23 LAB — APTT: aPTT: 29 s (ref 24–36)

## 2024-02-23 MED ORDER — LACTATED RINGERS IV SOLN: INTRAVENOUS | Status: AC

## 2024-02-23 MED ORDER — SODIUM CHLORIDE 0.9 % IV SOLN
2.0000 g | Freq: Once | INTRAVENOUS | Status: AC
  Administered 2024-02-23: 2 g via INTRAVENOUS
  Filled 2024-02-23: qty 20

## 2024-02-23 MED ORDER — SODIUM CHLORIDE 0.9 % IV SOLN
100.0000 mg | Freq: Two times a day (BID) | INTRAVENOUS | Status: DC
  Administered 2024-02-23 – 2024-02-25 (×4): 100 mg via INTRAVENOUS
  Filled 2024-02-23 (×4): qty 100

## 2024-02-23 MED ORDER — LACTATED RINGERS IV BOLUS (SEPSIS)
1000.0000 mL | Freq: Once | INTRAVENOUS | Status: AC
  Administered 2024-02-23: 1000 mL via INTRAVENOUS

## 2024-02-23 NOTE — ED Triage Notes (Signed)
 PT BIB EMS from home after being called out for agonal breathing. Per ems, pt's husband states that she has been gurgling since 1500, LKW 0230 this AM, depressed from recent family member death but no confirmed suicidal ideation. O2 79% on NRB & GCS 4 initially, received 10 minutes of rescue breathing via BVM. Given 2mg  IM narcan, GCS 13 and O2 90s NRB after narcan. Aphasia at baseline. Takes morphine, norco, and xanax at home.  NS given.  BP 110/60 90HR Cbg 160  18LAC

## 2024-02-23 NOTE — ED Notes (Signed)
Unable to obtain second set of cultures.

## 2024-02-23 NOTE — ED Provider Notes (Signed)
 [place] EMERGENCY DEPARTMENT AT [facility] Provider Note   CSN: [id number] Arrival date & time: 02/23/24  1922     History {Add pertinent medical, surgical, social history, OB history to HPI:1} Chief Complaint  Patient presents with   Drug Overdose    Sharon Huffman is a 63 y.o. female with CKD, inflammatory polyarthropathy, history of ovarian cancer, history of cervical cancer, HTN, dyslipidemia, GERD, dermatomyositis, autonomic neuropathy, chronic pain syndrome, GAD/MDD who presents BIB EMS from home after being called out for agonal breathing. Per ems, pt's husband states that she has been "gurgling" since 1500, LKW 0230 this AM, depressed from recent family member death but no confirmed suicidal ideation. O2 79% on NRB & GCS 4 initially, received 10 minutes of rescue breathing via BVM. Given 2mg  IM narcan, GCS 13 and O2 90s% NRB after narcan. Aphasia at baseline. Takes morphine, norco, and xanax at home. Home list also includes midodrine.   Patient on arrival is able to open eyes to noxious stimuli and eventually opens eyes to verbal stimuli over time in the ED. She repeatedly states "I am freezing." She does not answer other questions. Level 5 caveat for AMS.   With EMS: NS given. BP 110/60 90HR Cbg 160   Past Medical History:  Diagnosis Date   ADHD    Anxiety    Bronchitis, chronic (HCC)    Cervical cancer (HCC)    Depression    Depression    Hyperlipidemia    Hypertension    Insomnia disorder    Lyme disease    Migraine    Ovarian cancer (HCC)    Palpitations        Home Medications Prior to Admission medications   Medication Sig Start Date End Date Taking? Authorizing Provider  acebutolol (SECTRAL) 200 MG capsule TAKE 1 CAPSULE BY MOUTH EVERY DAY 08/29/23   Yates Decamp, MD  ALPRAZolam Prudy Feeler) 1 MG tablet Take 1 mg by mouth at bedtime. 06/10/19   [provider]  amphetamine-dextroamphetamine (ADDERALL XR) 30 MG 24 hr capsule Take 30 mg  by mouth at bedtime. 01/03/21   [provider]  azaTHIOprine (IMURAN) 50 MG tablet Take 50 mg by mouth daily. 08/08/22   [provider]  Biotin 10 MG CAPS Take by mouth daily.    [provider]  clobetasol cream (TEMOVATE) 0.05 % Apply topically. 06/13/22   [provider]  desonide (DESOWEN) 0.05 % cream Apply topically 2 (two) times daily as needed. 08/03/22   [provider]  famotidine (PEPCID) 20 MG tablet One after supper 03/20/19   Nyoka Cowden, MD  FLUoxetine (PROZAC) 40 MG capsule Take 40 mg by mouth at bedtime. 12/23/20   [provider]  furosemide (LASIX) 20 MG tablet Take 1 tablet (20 mg total) by mouth daily as needed for edema. 12/05/23   Yates Decamp, MD  HYDROcodone-acetaminophen (NORCO) 7.5-325 MG tablet Take 1 tablet by mouth 4 (four) times daily as needed. 07/09/21   [provider]  hydrOXYzine (ATARAX) 25 MG tablet Take 25 mg by mouth daily. 08/10/22   [provider]  ipratropium (ATROVENT) 0.06 % nasal spray Place 2 sprays into both nostrils as needed.    [provider]  KLOR-CON M20 20 MEQ tablet Take 20 mEq by mouth daily as needed. Take with furosemide only 08/21/22   [provider]  levothyroxine (SYNTHROID) 50 MCG tablet Take 50 mcg by mouth daily before breakfast.    [provider]  methylPREDNISolone (MEDROL) 4 MG tablet Take 2 tablets by mouth daily. 08/27/22   [provider]  midodrine (PROAMATINE) 5 MG tablet USE AS DIRECTED 08/29/23   Yates Decamp, MD  morphine (MS CONTIN) 15 MG 12 hr tablet Take 15 mg by mouth 2 (two) times daily. 08/24/22   [provider]  pantoprazole (PROTONIX) 40 MG tablet TAKE 1 TABLET (40 MG TOTAL) BY MOUTH DAILY. TAKE 30-60 MIN BEFORE FIRST MEAL OF THE DAY 12/30/19   Nyoka Cowden, MD  rosuvastatin (CRESTOR) 10 MG tablet Take 10 mg by mouth daily.    [provider]  spironolactone (ALDACTONE) 50 MG tablet TAKE 1 TABLET BY  MOUTH EVERY DAY 08/26/23   Yates Decamp, MD  tiZANidine (ZANAFLEX) 2 MG tablet Take 2 mg by mouth daily as needed. 08/12/22   [provider]  triamcinolone cream (KENALOG) 0.1 % SMARTSIG:Topical 1-2 Times Daily PRN 08/03/22   [provider]      Allergies    Amlodipine, Amoxicillin-pot clavulanate, Atorvastatin, Bystolic [nebivolol hcl], Carbamazepine, Mirtazapine, Sulfacetamide, and Erythromycin    Review of Systems   Review of Systems A 10 point review of systems was performed and is negative unless otherwise reported in HPI.  Physical Exam Updated Vital Signs BP 109/63   Pulse 90   Temp 98.2 F (36.8 C) (Temporal)   Resp 12   Ht 5\' 5"  (1.651 m)   Wt 65.3 kg   SpO2 99%   BMI 23.96 kg/m  Physical Exam General: Ill appearing female, lying in bed HEENT: NCAT, PERRLA midrange, no gaze preference, Sclera anicteric, dry mucous membranes with cracked lips, trachea midline.  Cardiology: RRR, no murmurs/rubs/gallops. BL radial and DP pulses equal bilaterally.  Resp: Increased respiratory effort moderately, tachypnea. Gross rhonchi bilaterally. Abd: Soft, non-tender, non-distended. No rebound tenderness or guarding.  GU: Deferred. MSK: No peripheral edema or signs of trauma. Extremities without deformity or TTP. No cyanosis or clubbing. Skin: warm, dry. Ecchymoses to BL shins. Neuro: Intermittently lethargic and responding to noxious stimuli, then responds and opens voice to verbal stimuli. Doesn't follow commands. Repeatedly stating "I am freezing" and doesn't answer questions. Moves all extremities spontaneously. CNs II-XII grossly intact. Sensation grossly intact.   ED Results / Procedures / Treatments   Labs (all labs ordered are listed, but only abnormal results are displayed) Labs Reviewed  CBC WITH DIFFERENTIAL/PLATELET - Abnormal; Notable for the following components:      Result Value   WBC 11.3 (*)    MCV 100.7 (*)    Neutro Abs 10.1 (*)    Lymphs Abs 0.5  (*)    All other components within normal limits  URINALYSIS, W/ REFLEX TO CULTURE (INFECTION SUSPECTED) - Abnormal; Notable for the following components:   Ketones, ur 5 (*)    All other components within normal limits  RAPID URINE DRUG SCREEN, HOSP PERFORMED - Abnormal; Notable for the following components:   Opiates POSITIVE (*)    Benzodiazepines POSITIVE (*)    Amphetamines POSITIVE (*)    All other components within normal limits  ACETAMINOPHEN LEVEL - Abnormal; Notable for the following components:   Acetaminophen (Tylenol), Serum <10 (*)    All other components within normal limits  SALICYLATE LEVEL - Abnormal; Notable for the following components:   Salicylate Lvl <7.0 (*)    All other components within normal limits  COMPREHENSIVE METABOLIC PANEL - Abnormal; Notable for the following components:   Glucose, Bld 106 (*)    BUN  26 (*)    Creatinine, Ser 2.09 (*)    Total Protein 5.5 (*)    Albumin 3.1 (*)    GFR, Estimated 26 (*)    All other components within normal limits  I-STAT VENOUS BLOOD GAS, ED - Abnormal; Notable for the following components:   pO2, Ven 74 (*)    Bicarbonate 29.8 (*)    Potassium 5.3 (*)    All other components within normal limits  I-STAT CHEM 8, ED - Abnormal; Notable for the following components:   Potassium 5.3 (*)    BUN 39 (*)    Creatinine, Ser 2.70 (*)    Glucose, Bld 109 (*)    All other components within normal limits  RESP PANEL BY RT-PCR (RSV, FLU A&B, COVID)  RVPGX2  CULTURE, BLOOD (ROUTINE X 2)  CULTURE, BLOOD (ROUTINE X 2)  PROTIME-INR  APTT  ETHANOL  BRAIN NATRIURETIC PEPTIDE  I-STAT CG4 LACTIC ACID, ED  I-STAT CG4 LACTIC ACID, ED  TROPONIN I (HIGH SENSITIVITY)  TROPONIN I (HIGH SENSITIVITY)    EKG None  Radiology CT Head Wo Contrast Result Date: 02/23/2024 CLINICAL DATA:  Mental status change, unknown cause EXAM: CT HEAD WITHOUT CONTRAST TECHNIQUE: Contiguous axial images were obtained from the base of the skull  through the vertex without intravenous contrast. RADIATION DOSE REDUCTION: This exam was performed according to the departmental dose-optimization program which includes automated exposure control, adjustment of the mA and/or kV according to patient size and/or use of iterative reconstruction technique. COMPARISON:  None Available. FINDINGS: Brain: No intracranial hemorrhage, mass effect, or midline shift. No hydrocephalus. The basilar cisterns are patent. No evidence of territorial infarct or acute ischemia. No extra-axial or intracranial fluid collection. Vascular: Atherosclerosis of skullbase vasculature without hyperdense vessel or abnormal calcification. Skull: No fracture or focal lesion. Sinuses/Orbits: No acute finding.  Bilateral cataract resection. Other: None. IMPRESSION: No acute intracranial abnormality. Electronically Signed   By: Narda Rutherford M.D.   On: 02/23/2024 22:25   DG Chest Portable 1 View Result Date: 02/23/2024 CLINICAL DATA:  SOB, hypoxia, rhonchi, OD EXAM: PORTABLE CHEST 1 VIEW COMPARISON:  Radiograph 03/20/2019, CT 09/05/2022 FINDINGS: Mild ill-defined patchy opacity at the left lung base. The heart is normal in size. Mediastinal contours are normal. No pulmonary edema, pleural effusion, or pneumothorax. On limited assessment, no acute osseous findings. IMPRESSION: Mild ill-defined patchy opacity at the left lung base, suspicious for pneumonia. Electronically Signed   By: Narda Rutherford M.D.   On: 02/23/2024 20:19    Procedures .Critical Care  Performed by: Loetta Rough, MD Authorized by: Loetta Rough, MD   Critical care provider statement:    Critical care time (minutes):  35   Critical care was necessary to treat or prevent imminent or life-threatening deterioration of the following conditions:  Respiratory failure and CNS failure or compromise   Critical care was time spent personally by me on the following activities:  Development of treatment plan with patient  or surrogate, discussions with consultants, evaluation of patient's response to treatment, examination of patient, ordering and review of laboratory studies, ordering and review of radiographic studies, ordering and performing treatments and interventions, pulse oximetry, re-evaluation of patient's condition, review of old charts and obtaining history from patient or surrogate   Care discussed with: admitting provider     {Document cardiac monitor, telemetry assessment procedure when appropriate:1}  Medications Ordered in ED Medications  lactated ringers infusion ( Intravenous New Bag/Given 02/23/24 2158)  doxycycline (VIBRAMYCIN) 100 mg  in sodium chloride 0.9 % 250 mL IVPB (0 mg Intravenous Stopped 02/23/24 2313)  lactated ringers bolus 1,000 mL (0 mLs Intravenous Stopped 02/23/24 2118)  cefTRIAXone (ROCEPHIN) 2 g in sodium chloride 0.9 % 100 mL IVPB (0 g Intravenous Stopped 02/23/24 2059)    ED Course/ Medical Decision Making/ A&P                          Medical Decision Making Amount and/or Complexity of Data Reviewed Labs: ordered. Decision-making details documented in ED Course. Radiology: ordered. Decision-making details documented in ED Course.  Risk Prescription drug management. Decision regarding hospitalization.    This patient presents to the ED for concern of ***, this involves an extensive number of treatment options, and is a complaint that carries with it a high risk of complications and morbidity.  I considered the following differential and admission for this acute, potentially life threatening condition.   MDM:    ***Ddx of acute altered mental status or encephalopathy considered but not limited to: -Intracranial abnormalities such as ICH, hydrocephalus, head trauma -Infection such as UTI, PNA, or meningitis -Toxic ingestion such as opioid overdose, anticholinergic toxicity, -Electrolyte abnormalities or hyper/hypoglycemia -Hypercarbia or hypoxia -Hepatic  encephalopathy or uremia -ACS or arrhythmia -Endocrine abnormality such as thyroid storm or myxedema coma  ***DDX for dyspnea includes but is not limited to: Cardiac- CHF, Myocardial Ischemia, Valvular heart disease, Arrythmia, Cardiac tamponade  Respiratory - Pneumonia / atelectasis / pulmonary effusion / cavitary lung disease, Pneumothorax, COPD/ reactive airway disease, PE, ARDS  Other - Sepsis, Anemia    Also some concern by the husband on scene and EMS for a possible overdose.  Seems that patient did improve her mental status with Narcan.  She does have morphine, Norco, and alprazolam listed among her home medications.  Possible unintentional overdose but intent is undetermined.  Has been told EMS on scene that she has had a really difficult time lately with a death in the family.  Due to unclear intent of a possible overdose, patient is placed on psych hold with continuous monitoring.  Also obtained other psych labs including salicylate and acetaminophen levels which were negative.  Per the husband on scene, patient does not have a history of suicide ideation or intent.  He is unaware of any SI.  Patient is not answering these questions at this time.   Clinical Course as of 02/23/24 2356  Wynelle Link Feb 23, 2024  2034 DG Chest Portable 1 View Mild ill-defined patchy opacity at the left lung base, suspicious for pneumonia.   [HN]  2034 WBC(!): 11.3 +leukocytosis [HN]  2034 pCO2, Ven: 58.5 No hypercarbia [HN]  2034 Lactic Acid, Venous: 1.9 wnl [HN]  2035 SpO2: 93 % [HN]  2035 O2 Flow Rate (L/min): 4 L/min [HN]  2102 Creatinine(!): 2.70 +AKI [HN]  2102 Neg salicylate, acetaminophen, EtOH. Trop wnl.  [HN]  2236 CT Head Wo Contrast No acute intracranial abnormality. [HN]  2236 D/w husband who states that her mother died 3 weeks ago, and her mother was "her whole life." She has been extremely depressed, sleeping a lot. Husband was concerned that she may try to harm herself or "inadvertently  end her life." [HN]    Clinical Course User Index [HN] Loetta Rough, MD    Labs: I Ordered, and personally interpreted labs.  The pertinent results include:  those listed above  Imaging Studies ordered: I ordered imaging studies including CTH, CXR I independently visualized  and interpreted imaging. I agree with the radiologist interpretation  Additional history obtained from EMS, chart review.    Cardiac Monitoring: The patient was maintained on a cardiac monitor.  I personally viewed and interpreted the cardiac monitored which showed an underlying rhythm of: NSR  Reevaluation: After the interventions noted above, I reevaluated the patient and found that they have :improved  Social Determinants of Health: Lives independently  Disposition:  Admit to hospitalist  Co morbidities that complicate the patient evaluation  Past Medical History:  Diagnosis Date   ADHD    Anxiety    Bronchitis, chronic (HCC)    Cervical cancer (HCC)    Depression    Depression    Hyperlipidemia    Hypertension    Insomnia disorder    Lyme disease    Migraine    Ovarian cancer (HCC)    Palpitations      Medicines Meds ordered this encounter  Medications   lactated ringers infusion   lactated ringers bolus 1,000 mL    Reason 30 mL/kg dose is not being ordered:   First Lactic Acid Pending   cefTRIAXone (ROCEPHIN) 2 g in sodium chloride 0.9 % 100 mL IVPB    Antibiotic Indication::   CAP   doxycycline (VIBRAMYCIN) 100 mg in sodium chloride 0.9 % 250 mL IVPB    Antibiotic Indication::   CAP    I have reviewed the patients home medicines and have made adjustments as needed  Problem List / ED Course: Problem List Items Addressed This Visit   None Visit Diagnoses       Acute hypoxic respiratory failure (HCC)    -  Primary     Pneumonia of left lower lobe due to infectious organism       Relevant Medications   cefTRIAXone (ROCEPHIN) 2 g in sodium chloride 0.9 % 100 mL IVPB  (Completed)   doxycycline (VIBRAMYCIN) 100 mg in sodium chloride 0.9 % 250 mL IVPB     Drug overdose of undetermined intent, initial encounter                {Document critical care time when appropriate:1} {Document review of labs and clinical decision tools ie heart score, Chads2Vasc2 etc:1}  {Document your independent review of radiology images, and any outside records:1} {Document your discussion with family members, caretakers, and with consultants:1} {Document social determinants of health affecting pt's care:1} {Document your decision making why or why not admission, treatments were needed:1}  This note was created using dictation software, which may contain spelling or grammatical errors.

## 2024-02-23 NOTE — Sepsis Progress Note (Signed)
 Elink following code sepsis

## 2024-02-24 ENCOUNTER — Inpatient Hospital Stay (HOSPITAL_COMMUNITY)

## 2024-02-24 ENCOUNTER — Encounter: Payer: Self-pay | Admitting: Cardiology

## 2024-02-24 ENCOUNTER — Other Ambulatory Visit: Payer: Self-pay

## 2024-02-24 DIAGNOSIS — T6592XA Toxic effect of unspecified substance, intentional self-harm, initial encounter: Secondary | ICD-10-CM | POA: Diagnosis present

## 2024-02-24 DIAGNOSIS — I739 Peripheral vascular disease, unspecified: Secondary | ICD-10-CM | POA: Diagnosis present

## 2024-02-24 DIAGNOSIS — F0283 Dementia in other diseases classified elsewhere, unspecified severity, with mood disturbance: Secondary | ICD-10-CM | POA: Diagnosis present

## 2024-02-24 DIAGNOSIS — T50902A Poisoning by unspecified drugs, medicaments and biological substances, intentional self-harm, initial encounter: Secondary | ICD-10-CM

## 2024-02-24 DIAGNOSIS — Z781 Physical restraint status: Secondary | ICD-10-CM | POA: Diagnosis not present

## 2024-02-24 DIAGNOSIS — T50901D Poisoning by unspecified drugs, medicaments and biological substances, accidental (unintentional), subsequent encounter: Secondary | ICD-10-CM | POA: Diagnosis not present

## 2024-02-24 DIAGNOSIS — Z634 Disappearance and death of family member: Secondary | ICD-10-CM | POA: Diagnosis not present

## 2024-02-24 DIAGNOSIS — F411 Generalized anxiety disorder: Secondary | ICD-10-CM | POA: Diagnosis present

## 2024-02-24 DIAGNOSIS — E860 Dehydration: Secondary | ICD-10-CM | POA: Diagnosis not present

## 2024-02-24 DIAGNOSIS — N179 Acute kidney failure, unspecified: Secondary | ICD-10-CM

## 2024-02-24 DIAGNOSIS — E039 Hypothyroidism, unspecified: Secondary | ICD-10-CM | POA: Diagnosis not present

## 2024-02-24 DIAGNOSIS — G43909 Migraine, unspecified, not intractable, without status migrainosus: Secondary | ICD-10-CM | POA: Diagnosis present

## 2024-02-24 DIAGNOSIS — T50901A Poisoning by unspecified drugs, medicaments and biological substances, accidental (unintentional), initial encounter: Secondary | ICD-10-CM | POA: Diagnosis present

## 2024-02-24 DIAGNOSIS — J690 Pneumonitis due to inhalation of food and vomit: Secondary | ICD-10-CM | POA: Diagnosis present

## 2024-02-24 DIAGNOSIS — T50902D Poisoning by unspecified drugs, medicaments and biological substances, intentional self-harm, subsequent encounter: Secondary | ICD-10-CM | POA: Diagnosis not present

## 2024-02-24 DIAGNOSIS — F332 Major depressive disorder, recurrent severe without psychotic features: Secondary | ICD-10-CM | POA: Diagnosis present

## 2024-02-24 DIAGNOSIS — F32A Depression, unspecified: Secondary | ICD-10-CM | POA: Diagnosis not present

## 2024-02-24 DIAGNOSIS — E875 Hyperkalemia: Secondary | ICD-10-CM

## 2024-02-24 DIAGNOSIS — F909 Attention-deficit hyperactivity disorder, unspecified type: Secondary | ICD-10-CM | POA: Diagnosis present

## 2024-02-24 DIAGNOSIS — J9601 Acute respiratory failure with hypoxia: Secondary | ICD-10-CM | POA: Diagnosis present

## 2024-02-24 DIAGNOSIS — F1120 Opioid dependence, uncomplicated: Secondary | ICD-10-CM | POA: Diagnosis present

## 2024-02-24 DIAGNOSIS — E876 Hypokalemia: Secondary | ICD-10-CM | POA: Diagnosis not present

## 2024-02-24 DIAGNOSIS — J189 Pneumonia, unspecified organism: Secondary | ICD-10-CM | POA: Diagnosis present

## 2024-02-24 DIAGNOSIS — N1831 Chronic kidney disease, stage 3a: Secondary | ICD-10-CM | POA: Diagnosis present

## 2024-02-24 DIAGNOSIS — G894 Chronic pain syndrome: Secondary | ICD-10-CM | POA: Diagnosis present

## 2024-02-24 DIAGNOSIS — I129 Hypertensive chronic kidney disease with stage 1 through stage 4 chronic kidney disease, or unspecified chronic kidney disease: Secondary | ICD-10-CM | POA: Diagnosis present

## 2024-02-24 DIAGNOSIS — E785 Hyperlipidemia, unspecified: Secondary | ICD-10-CM | POA: Diagnosis present

## 2024-02-24 DIAGNOSIS — J9602 Acute respiratory failure with hypercapnia: Secondary | ICD-10-CM | POA: Diagnosis present

## 2024-02-24 LAB — C-REACTIVE PROTEIN: CRP: 6.1 mg/dL — ABNORMAL HIGH (ref <1.0)

## 2024-02-24 LAB — BLOOD GAS, VENOUS
Acid-Base Excess: 0.8 mmol/L (ref 0.0–2.0)
Bicarbonate: 29.1 mmol/L — ABNORMAL HIGH (ref 20.0–28.0)
O2 Saturation: 49 %
Patient temperature: 37
pCO2, Ven: 62 mmHg — ABNORMAL HIGH (ref 44–60)
pH, Ven: 7.28 (ref 7.25–7.43)
pO2, Ven: 33 mmHg (ref 32–45)

## 2024-02-24 LAB — CREATININE, SERUM
Creatinine, Ser: 1.89 mg/dL — ABNORMAL HIGH (ref 0.44–1.00)
GFR, Estimated: 29 mL/min — ABNORMAL LOW (ref >60)

## 2024-02-24 LAB — HEMOGLOBIN A1C
Hgb A1c MFr Bld: 5.7 % — ABNORMAL HIGH (ref 4.8–5.6)
Mean Plasma Glucose: 116.89 mg/dL

## 2024-02-24 LAB — CK: Total CK: 129 U/L (ref 38–234)

## 2024-02-24 LAB — CBC
HCT: 38.4 % (ref 36.0–46.0)
Hemoglobin: 12.3 g/dL (ref 12.0–15.0)
MCH: 31.9 pg (ref 26.0–34.0)
MCHC: 32 g/dL (ref 30.0–36.0)
MCV: 99.5 fL (ref 80.0–100.0)
Platelets: 203 10*3/uL (ref 150–400)
RBC: 3.86 MIL/uL — ABNORMAL LOW (ref 3.87–5.11)
RDW: 12.3 % (ref 11.5–15.5)
WBC: 14.8 10*3/uL — ABNORMAL HIGH (ref 4.0–10.5)
nRBC: 0 % (ref 0.0–0.2)

## 2024-02-24 LAB — PROCALCITONIN: Procalcitonin: 0.63 ng/mL

## 2024-02-24 LAB — POTASSIUM
Potassium: 5.3 mmol/L — ABNORMAL HIGH (ref 3.5–5.1)
Potassium: 4.9 mmol/L (ref 3.5–5.1)

## 2024-02-24 LAB — TSH
TSH: 1.517 u[IU]/mL (ref 0.350–4.500)
TSH: 1.088 u[IU]/mL (ref 0.350–4.500)

## 2024-02-24 LAB — HIV ANTIBODY (ROUTINE TESTING W REFLEX): HIV Screen 4th Generation wRfx: NONREACTIVE

## 2024-02-24 LAB — BRAIN NATRIURETIC PEPTIDE: B Natriuretic Peptide: 131.3 pg/mL — ABNORMAL HIGH (ref 0.0–100.0)

## 2024-02-24 MED ORDER — ONDANSETRON HCL 4 MG/2ML IJ SOLN: 4.0000 mg | Freq: Four times a day (QID) | INTRAMUSCULAR | Status: DC | PRN

## 2024-02-24 MED ORDER — QUETIAPINE FUMARATE 25 MG PO TABS
25.0000 mg | ORAL_TABLET | Freq: Every day | ORAL | Status: DC
  Administered 2024-02-25 – 2024-02-28 (×4): 25 mg via ORAL
  Filled 2024-02-24 (×5): qty 1

## 2024-02-24 MED ORDER — ZIPRASIDONE MESYLATE 20 MG IM SOLR
10.0000 mg | Freq: Once | INTRAMUSCULAR | Status: AC
  Administered 2024-02-24: 10 mg via INTRAMUSCULAR
  Filled 2024-02-24: qty 20

## 2024-02-24 MED ORDER — HEPARIN SODIUM (PORCINE) 5000 UNIT/ML IJ SOLN
5000.0000 [IU] | Freq: Three times a day (TID) | INTRAMUSCULAR | Status: DC
  Administered 2024-02-24 – 2024-02-27 (×11): 5000 [IU] via SUBCUTANEOUS
  Filled 2024-02-24 (×12): qty 1

## 2024-02-24 MED ORDER — ASPIRIN 325 MG PO TABS
325.0000 mg | ORAL_TABLET | Freq: Every day | ORAL | Status: AC
  Filled 2024-02-24: qty 1

## 2024-02-24 MED ORDER — SODIUM CHLORIDE 0.9 % IV SOLN
3.0000 g | Freq: Two times a day (BID) | INTRAVENOUS | Status: DC
Start: 2024-02-24 — End: 2024-02-25
  Administered 2024-02-24 – 2024-02-25 (×3): 3 g via INTRAVENOUS
  Filled 2024-02-24 (×3): qty 8

## 2024-02-24 MED ORDER — ONDANSETRON HCL 4 MG PO TABS: 4.0000 mg | ORAL_TABLET | Freq: Four times a day (QID) | ORAL | Status: DC | PRN

## 2024-02-24 MED ORDER — ASPIRIN 81 MG PO TBEC
81.0000 mg | DELAYED_RELEASE_TABLET | Freq: Every day | ORAL | Status: DC
Start: 2024-02-25 — End: 2024-03-10
  Administered 2024-02-26 – 2024-03-10 (×14): 81 mg via ORAL
  Filled 2024-02-24 (×15): qty 1

## 2024-02-24 NOTE — ED Notes (Addendum)
 Pt had episode of emesis. Rn called to room and suctioned pt mouth . Pt spo2 dropped to 85% on 2L at this time. RN increased oxygen to 6L [place] and pt spo2 increased to 92% . Provider made aware.

## 2024-02-24 NOTE — ED Notes (Signed)
 Pt to mri

## 2024-02-24 NOTE — ED Notes (Signed)
 Suicide sitter at bedside at this time. No other new orders at this time.

## 2024-02-24 NOTE — ED Notes (Signed)
 Per nurse too early to collect k, dr will let her know when to draw.  KM

## 2024-02-24 NOTE — Plan of Care (Signed)
 Patient was placed in restraints due to patient being physical. Unable to obtain full skin assessment due to patient being combative. Medication given, Geodon, patient still agitated. Sitter at bedside.   Problem: Education: Goal: Knowledge of General Education information will improve Description: Including pain rating scale, medication(s)/side effects and non-pharmacologic comfort measures Outcome: Progressing   Problem: Activity: Goal: Risk for activity intolerance will decrease Outcome: Progressing   Problem: Pain Managment: Goal: General experience of comfort will improve and/or be controlled Outcome: Progressing   Problem: Safety: Goal: Ability to remain free from injury will improve Outcome: Progressing

## 2024-02-24 NOTE — Progress Notes (Signed)
 Pharmacy Antibiotic Note  Sharon Huffman is a 63 y.o. female admitted on 02/23/2024 with aspiration pneumonia.  Pharmacy has been consulted for unasyn dosing.  Plan: Unasyn 3g q12h  F/u renal function, infectious work up and length of therapy  Height: 5\' 5"  (165.1 cm) Weight: 65.3 kg (143 lb 15.4 oz) IBW/kg (Calculated) : 57  Temp (24hrs), Avg:97.9 F (36.6 C), Min:96.4 F (35.8 C), Max:99.1 F (37.3 C)  Recent Labs  Lab 02/23/24 1922 02/23/24 1952 02/23/24 1953 02/23/24 2130  WBC 11.3*  --   --   --   CREATININE  --  2.70*  --  2.09*  LATICACIDVEN  --   --  1.9  --     Estimated Creatinine Clearance: 24.5 mL/min (A) (by C-G formula based on SCr of 2.09 mg/dL (H)).    Allergies  Allergen Reactions   Amlodipine Swelling and Other (See Comments)    Leg edema and fluid retention   Amoxicillin-Pot Clavulanate Nausea Only   Atorvastatin     Other reaction(s): back ache   Bystolic [Nebivolol Hcl] Other (See Comments)    sweat and get clammy   Carbamazepine     Other reaction(s): rash   Mirtazapine     Other reaction(s): edema, vivid nightmares   Sulfacetamide Nausea Only   Erythromycin Nausea And Vomiting   Thank you for allowing pharmacy to be a part of this patient's care.  Marja Kays 02/24/2024 1:28 AM

## 2024-02-24 NOTE — ED Notes (Signed)
 Patient trying to get out of bed,ripping off cords and trying to rip out IV's. Patient also kicking and hitting staff in the ED. MD Ogbata paged again.

## 2024-02-24 NOTE — ED Notes (Signed)
 Pt more alert with eyes open looking around, given water.

## 2024-02-24 NOTE — ED Notes (Signed)
 Patient climbing out of bed, ripping out IVs, ripping off monitoring equipment and was on the very edge of the bed about to jump. RN made MD Ogbata aware and asked for soft restraints or medication to sedate/calm her. Staffing office also called for a suicide sitter. No orders at this time. No sitters available at this time. Bed alarm on, grip socks on, call bell in reach, door open at this time.

## 2024-02-24 NOTE — ED Notes (Signed)
 Pt transported to MRI

## 2024-02-24 NOTE — H&P (Signed)
 History and Physical    Sharon Huffman:[id number] DOB: [birth year] DOA: 02/23/2024  PCP: Merri Brunette, MD  Patient coming from:  Home  I have personally briefly reviewed patient's old medical records in [facility]  Chief Complaint: overdose   HPI: Sharon Huffman is a 63 y.o. female with medical history significant of  ADHD,Anxiety,Depression, hypothyroidism,HLD, HTN, Lyme disease, Migraine who presents to ED BIB EMS after being found down at home unresponsive with agonal breathing. 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 Per chart patient was last seen over 12 hours ago. Patient also has recent passing of her mother which has taken a toll. Of note it is unclear what medications  patient took or the quantity but patient has access to morphine, norco, and xanax  at home. Patient note able to give history but is oriented to self and place but note time. She notes no current pain , no sob/ chest pain/ n/v/d.   ED Course:  IN ED  Vitals:96.4, bp 107/73, hr 95, rr 17, sat 93% on 4L  Wbc 11.3, hgb 13.2 MCV 100.7, plt 246 pmn 10.1 Na 137, K5.3, CL 106, cr 2.70 (0.78) repeat 2.09 Vbg:7.315/ pco258.5 Lactic 1.7 RVP: neg Salicylate<7 Tylenol <10 Eoth < 10 irn 1.0 UA: neg UDS: +opiates,+benzo, +amphetamines EKG: nsr normal intervals CTH; neg TX:CTX, LR 1L, LR 150cc/hr/doxycycline` Review of Systems: As per HPI otherwise 10 point review of systems negative.   Past Medical History:  Diagnosis Date   ADHD    Anxiety    Bronchitis, chronic (HCC)    Cervical cancer (HCC)    Depression    Depression    Hyperlipidemia    Hypertension    Insomnia disorder    Lyme disease    Migraine    Ovarian cancer (HCC)    Palpitations     Past Surgical History:  Procedure Laterality Date   ABDOMINAL HYSTERECTOMY  1994   ANTERIOR CERVICAL DECOMP/DISCECTOMY  FUSION  2000   AUGMENTATION MAMMAPLASTY Bilateral 1998   BREAST ENHANCEMENT SURGERY Bilateral    EYE SURGERY  2016   PVD     reports that she quit smoking about 5 years ago. Her smoking use included cigarettes. She started smoking about 48 years ago. She has a 21.5 pack-year smoking history. She has never used smokeless tobacco. She reports that she does not currently use alcohol after a past usage of about 7.0 standard drinks of alcohol per week. She reports that she does not use drugs.  Allergies  Allergen Reactions   Amlodipine Swelling and Other (See Comments)    Leg edema and fluid retention   Amoxicillin-Pot Clavulanate Nausea Only   Atorvastatin     Other reaction(s): back ache   Bystolic [Nebivolol Hcl] Other (See Comments)    sweat and get clammy   Carbamazepine     Other reaction(s): rash   Mirtazapine     Other reaction(s): edema, vivid nightmares   Sulfacetamide Nausea Only   Erythromycin Nausea And Vomiting    Family History  Problem Relation Age of Onset   Dementia Mother    Hypertension Mother    Hyperlipidemia Mother    Heart attack Father        109   Heart disease Father     Prior to Admission  medications   Medication Sig Start Date End Date Taking? Authorizing Provider  acebutolol (SECTRAL) 200 MG capsule TAKE 1 CAPSULE BY MOUTH EVERY DAY 08/29/23   Yates Decamp, MD  ALPRAZolam Prudy Feeler) 1 MG tablet Take 1 mg by mouth at bedtime. 06/10/19   [provider]  amphetamine-dextroamphetamine (ADDERALL XR) 30 MG 24 hr capsule Take 30 mg by mouth at bedtime. 01/03/21   [provider]  azaTHIOprine (IMURAN) 50 MG tablet Take 50 mg by mouth daily. 08/08/22   [provider]  Biotin 10 MG CAPS Take by mouth daily.    [provider]  clobetasol cream (TEMOVATE) 0.05 % Apply topically. 06/13/22   [provider]  desonide (DESOWEN) 0.05 % cream Apply topically 2 (two) times daily as needed. 08/03/22   [provider]   famotidine (PEPCID) 20 MG tablet One after supper 03/20/19   Nyoka Cowden, MD  FLUoxetine (PROZAC) 40 MG capsule Take 40 mg by mouth at bedtime. 12/23/20   [provider]  furosemide (LASIX) 20 MG tablet Take 1 tablet (20 mg total) by mouth daily as needed for edema. 12/05/23   Yates Decamp, MD  HYDROcodone-acetaminophen (NORCO) 7.5-325 MG tablet Take 1 tablet by mouth 4 (four) times daily as needed. 07/09/21   [provider]  hydrOXYzine (ATARAX) 25 MG tablet Take 25 mg by mouth daily. 08/10/22   [provider]  ipratropium (ATROVENT) 0.06 % nasal spray Place 2 sprays into both nostrils as needed.    [provider]  KLOR-CON M20 20 MEQ tablet Take 20 mEq by mouth daily as needed. Take with furosemide only 08/21/22   [provider]  levothyroxine (SYNTHROID) 50 MCG tablet Take 50 mcg by mouth daily before breakfast.    [provider]  methylPREDNISolone (MEDROL) 4 MG tablet Take 2 tablets by mouth daily. 08/27/22   [provider]  midodrine (PROAMATINE) 5 MG tablet USE AS DIRECTED 08/29/23   Yates Decamp, MD  morphine (MS CONTIN) 15 MG 12 hr tablet Take 15 mg by mouth 2 (two) times daily. 08/24/22   [provider]  pantoprazole (PROTONIX) 40 MG tablet TAKE 1 TABLET (40 MG TOTAL) BY MOUTH DAILY. TAKE 30-60 MIN BEFORE FIRST MEAL OF THE DAY 12/30/19   Nyoka Cowden, MD  rosuvastatin (CRESTOR) 10 MG tablet Take 10 mg by mouth daily.    [provider]  spironolactone (ALDACTONE) 50 MG tablet TAKE 1 TABLET BY MOUTH EVERY DAY 08/26/23   Yates Decamp, MD  tiZANidine (ZANAFLEX) 2 MG tablet Take 2 mg by mouth daily as needed. 08/12/22   [provider]  triamcinolone cream (KENALOG) 0.1 % SMARTSIG:Topical 1-2 Times Daily PRN 08/03/22   [provider]    Physical Exam: Vitals:   02/23/24 2342 02/23/24 2345 02/24/24 0000 02/24/24 0015  BP:  109/63 95/69 97/67   Pulse:  90 88 89  Resp:  12 11 18   Temp: 98.2 F  (36.8 C)     TempSrc: Temporal     SpO2:  99% 95% 97%  Weight:      Height:        Constitutional: NAD, calm, comfortable Vitals:   02/23/24 2342 02/23/24 2345 02/24/24 0000 02/24/24 0015  BP:  109/63 95/69 97/67   Pulse:  90 88 89  Resp:  12 11 18   Temp: 98.2 F (36.8 C)     TempSrc: Temporal     SpO2:  99% 95% 97%  Weight:  Height:       Eyes: PERRL, lids and conjunctivae normal ENMT: Mucous membranes are dry.  Neck: normal, supple, no masses, no thyromegaly Respiratory: clear to auscultation bilaterally, no wheezing, no crackles. Normal respiratory effort. No accessory muscle use.  Cardiovascular: Regular rate and rhythm, no murmurs / rubs / gallops. No extremity edema. 2+ pedal pulses Abdomen: no tenderness, no masses palpated. No hepatosplenomegaly. Bowel sounds positive.  Musculoskeletal: no clubbing / cyanosis. No joint deformity upper and lower extremities. Good ROM, no contractures. Normal muscle tone.  Skin: no rashes, lesions, ulcers. No induration Neurologic: CN 2-12 grossly intact. Sensation intact, poor effort but appears weak on right upper ext compared to left  Psychiatric: poor judgment and insight. Alert and oriented x 2.    Labs on Admission: I have personally reviewed following labs and imaging studies  CBC: Recent Labs  Lab 02/23/24 1922 02/23/24 1952  WBC 11.3*  --   NEUTROABS 10.1*  --   HGB 13.2 13.6  13.6  HCT 41.9 40.0  40.0  MCV 100.7*  --   PLT 246  --    Basic Metabolic Panel: Recent Labs  Lab 02/23/24 1952 02/23/24 2130  NA 138  137 138  K 5.3*  5.3* 4.4  CL 106 102  CO2  --  23  GLUCOSE 109* 106*  BUN 39* 26*  CREATININE 2.70* 2.09*  CALCIUM  --  9.1   GFR: Estimated Creatinine Clearance: 24.5 mL/min (A) (by C-G formula based on SCr of 2.09 mg/dL (H)). Liver Function Tests: Recent Labs  Lab 02/23/24 2130  AST 27  ALT 18  ALKPHOS 56  BILITOT 1.0  PROT 5.5*  ALBUMIN 3.1*   No results for input(s): "LIPASE",  "AMYLASE" in the last 168 hours. No results for input(s): "AMMONIA" in the last 168 hours. Coagulation Profile: Recent Labs  Lab 02/23/24 1922  INR 1.0   Cardiac Enzymes: No results for input(s): "CKTOTAL", "CKMB", "CKMBINDEX", "TROPONINI" in the last 168 hours. BNP (last 3 results) No results for input(s): "PROBNP" in the last 8760 hours. HbA1C: No results for input(s): "HGBA1C" in the last 72 hours. CBG: No results for input(s): "GLUCAP" in the last 168 hours. Lipid Profile: No results for input(s): "CHOL", "HDL", "LDLCALC", "TRIG", "CHOLHDL", "LDLDIRECT" in the last 72 hours. Thyroid Function Tests: No results for input(s): "TSH", "T4TOTAL", "FREET4", "T3FREE", "THYROIDAB" in the last 72 hours. Anemia Panel: No results for input(s): "VITAMINB12", "FOLATE", "FERRITIN", "TIBC", "IRON", "RETICCTPCT" in the last 72 hours. Urine analysis:    Component Value Date/Time   COLORURINE YELLOW 02/23/2024 1931   APPEARANCEUR CLEAR 02/23/2024 1931   LABSPEC 1.016 02/23/2024 1931   PHURINE 5.0 02/23/2024 1931   GLUCOSEU NEGATIVE 02/23/2024 1931   HGBUR NEGATIVE 02/23/2024 1931   BILIRUBINUR NEGATIVE 02/23/2024 1931   KETONESUR 5 (A) 02/23/2024 1931   PROTEINUR NEGATIVE 02/23/2024 1931   UROBILINOGEN 4.0 (H) 09/15/2014 1315   NITRITE NEGATIVE 02/23/2024 1931   LEUKOCYTESUR NEGATIVE 02/23/2024 1931    Radiological Exams on Admission: CT Head Wo Contrast Result Date: 02/23/2024 CLINICAL DATA:  Mental status change, unknown cause EXAM: CT HEAD WITHOUT CONTRAST TECHNIQUE: Contiguous axial images were obtained from the base of the skull through the vertex without intravenous contrast. RADIATION DOSE REDUCTION: This exam was performed according to the departmental dose-optimization program which includes automated exposure control, adjustment of the mA and/or kV according to patient size and/or use of iterative reconstruction technique. COMPARISON:  None Available. FINDINGS: Brain: No  intracranial hemorrhage, mass  effect, or midline shift. No hydrocephalus. The basilar cisterns are patent. No evidence of territorial infarct or acute ischemia. No extra-axial or intracranial fluid collection. Vascular: Atherosclerosis of skullbase vasculature without hyperdense vessel or abnormal calcification. Skull: No fracture or focal lesion. Sinuses/Orbits: No acute finding.  Bilateral cataract resection. Other: None. IMPRESSION: No acute intracranial abnormality. Electronically Signed   By: Narda Rutherford M.D.   On: 02/23/2024 22:25   DG Chest Portable 1 View Result Date: 02/23/2024 CLINICAL DATA:  SOB, hypoxia, rhonchi, OD EXAM: PORTABLE CHEST 1 VIEW COMPARISON:  Radiograph 03/20/2019, CT 09/05/2022 FINDINGS: Mild ill-defined patchy opacity at the left lung base. The heart is normal in size. Mediastinal contours are normal. No pulmonary edema, pleural effusion, or pneumothorax. On limited assessment, no acute osseous findings. IMPRESSION: Mild ill-defined patchy opacity at the left lung base, suspicious for pneumonia. Electronically Signed   By: Narda Rutherford M.D.   On: 02/23/2024 20:19    EKG: Independently reviewed.   Assessment/Plan  Overdose presumed intentional  Complicated by Aspiration and acute hypoxic/hypercarbic respiratory failure  -admit to progressive care  -Unasyn  -place on 1 to 1 for Suicide  -s/p IVC  -psych consult in am   AKI -presumed pre-renal  - continue with ivfs  -hold nephrotoxic medications  -strict I/o   Mild hyperkalemia -repeat pending   Hypothyroidism  -on synthroid , resume medication s/p 24 hour observation   HLD -resume medication in 24 hours   ADHD Anxiety Depression -hold medications x24 hours  -psych to leave final recs    HTN -relative hypotension  -continue with ivfs    Migraine -stable no acute issues   DVT prophylaxis: heparin Code Status: full/ as discussed per patient wishes in event of cardiac arrest  Family  Communication: none at bedside Disposition Plan: patient  expected to be admitted greater than 2 midnights  Consults called: psych, Poison control Admission status: progressive    Lurline Del MD [facility]   If 7PM-7AM, please contact night-coverage [email or web address] Password [facility]  02/24/2024, 12:43 AM

## 2024-02-24 NOTE — Progress Notes (Signed)
 PROGRESS NOTE    Sharon Huffman  ZOX:[id number] DOB: Dec 16, 1961 DOA: 02/23/2024 PCP: Merri Brunette, MD  Outpatient Specialists:     Brief Narrative:  Patient is a 63 year old female past medical history significant for anxiety, depression, hyperlipidemia, hypertension, hypothyroidism, Lyme's disease, migraine headache and ADHD.  Patient was found down at home (unresponsive) by her husband.  Patient had agonal breathing, with O2 sat of 70% on nonrebreather.  Patient was administered IM Narcan 2 Mg and Glasgow Coma Scale improved from 4-13.  O2 sat improved to 90% on nonrebreather.  Patient lost her mother recently.  Patient is known to have access to morphine, Xanax and Norco.  There are concerns that the patient may have taken the medications.  Psychiatric team has been consulted.  02/24/2024: Patient seen alongside patient's husband.  Patient is awake and alert, patient remains drowsy.  Patient is not able to give significant history.  Patient vomiting reported.  Chest x-ray concerning for possible aspiration/multifocal pneumonia.  Patient is currently on IV Unasyn.   Assessment & Plan:   Principal Problem:   Overdose   Likely overdose: -Presumed intentional  -Complicated by Aspiration and acute hypoxic/hypercarbic respiratory failure  -Unasyn  -place on 1 to 1 for Suicide  -s/p IVC  -psych consult   Acute kidney injury: -presumed pre-renal  - continue with ivfs  -hold nephrotoxic medications  -strict I/o  -UA revealed specific gravity of 1.016 and positive ketone. Will check urine sodium.   Mild hyperkalemia -repeat pending  -Likely related to the AKI.   Hypothyroidism  -on synthroid , resume medication s/p 24 hour observation    HLD -resume medication in 24 hours    ADHD Anxiety Depression -hold medications x24 hours  -psych to leave final recs     HTN -relative hypotension  -continue with ivfs  02/24/2024: Stable.    Migraine -stable no acute issues   Nausea  and vomiting: -Zofran as needed.  Multifocal pneumonia: -Continue IV Unasyn.   DVT prophylaxis: Subcutaneous heparin Code Status: Full code Family Communication: Husband Disposition Plan: Telemetry inpatient   Consultants:  Psychiatry  Procedures:  None  Antimicrobials:  IV Unasyn   Subjective: Patient could not give coherent history.  Objective: Vitals:   02/24/24 0730 02/24/24 0800 02/24/24 0815 02/24/24 1046  BP: 107/67   103/68  Pulse: 83 87 91 87  Resp: 18 18 14 19   Temp:      TempSrc:      SpO2: 99% 96% 95% 100%  Weight:      Height:       No intake or output data in the 24 hours ending 02/24/24 1148 Filed Weights   02/23/24 1927  Weight: 65.3 kg    Examination:  General exam: Awake and alert, but remains drowsy.  Bruises in upper and lower extremities. Respiratory system: Clear to auscultation.  Cardiovascular system: S1 & S2 heard Gastrointestinal system: Soft and nontender Central nervous system: Awake, alert, but drowsy. Extremities: Bruises of the extremities.  Minimal lower extremity edema.  Data Reviewed: I have personally reviewed following labs and imaging studies  CBC: Recent Labs  Lab 02/23/24 1922 02/23/24 1952 02/24/24 0129  WBC 11.3*  --  14.8*  NEUTROABS 10.1*  --   --   HGB 13.2 13.6  13.6 12.3  HCT 41.9 40.0  40.0 38.4  MCV 100.7*  --  99.5  PLT 246  --  203   Basic Metabolic Panel: Recent Labs  Lab 02/23/24 1952 02/23/24 2130 02/24/24 0129  02/24/24 0727  NA 138  137 138  --   --   K 5.3*  5.3* 4.4 5.3* 4.9  CL 106 102  --   --   CO2  --  23  --   --   GLUCOSE 109* 106*  --   --   BUN 39* 26*  --   --   CREATININE 2.70* 2.09* 1.89*  --   CALCIUM  --  9.1  --   --    GFR: Estimated Creatinine Clearance: 27.1 mL/min (A) (by C-G formula based on SCr of 1.89 mg/dL (H)). Liver Function Tests: Recent Labs  Lab 02/23/24 2130  AST 27  ALT 18  ALKPHOS 56  BILITOT 1.0  PROT 5.5*  ALBUMIN 3.1*   No  results for input(s): "LIPASE", "AMYLASE" in the last 168 hours. No results for input(s): "AMMONIA" in the last 168 hours. Coagulation Profile: Recent Labs  Lab 02/23/24 1922  INR 1.0   Cardiac Enzymes: No results for input(s): "CKTOTAL", "CKMB", "CKMBINDEX", "TROPONINI" in the last 168 hours. BNP (last 3 results) No results for input(s): "PROBNP" in the last 8760 hours. HbA1C: Recent Labs    02/24/24 0129  HGBA1C 5.7*   CBG: No results for input(s): "GLUCAP" in the last 168 hours. Lipid Profile: No results for input(s): "CHOL", "HDL", "LDLCALC", "TRIG", "CHOLHDL", "LDLDIRECT" in the last 72 hours. Thyroid Function Tests: Recent Labs    02/24/24 0727  TSH 1.088   Anemia Panel: No results for input(s): "VITAMINB12", "FOLATE", "FERRITIN", "TIBC", "IRON", "RETICCTPCT" in the last 72 hours. Urine analysis:    Component Value Date/Time   COLORURINE YELLOW 02/23/2024 1931   APPEARANCEUR CLEAR 02/23/2024 1931   LABSPEC 1.016 02/23/2024 1931   PHURINE 5.0 02/23/2024 1931   GLUCOSEU NEGATIVE 02/23/2024 1931   HGBUR NEGATIVE 02/23/2024 1931   BILIRUBINUR NEGATIVE 02/23/2024 1931   KETONESUR 5 (A) 02/23/2024 1931   PROTEINUR NEGATIVE 02/23/2024 1931   UROBILINOGEN 4.0 (H) 09/15/2014 1315   NITRITE NEGATIVE 02/23/2024 1931   LEUKOCYTESUR NEGATIVE 02/23/2024 1931   Sepsis Labs: @LABRCNTIP (procalcitonin:4,lacticidven:4)  ) Recent Results (from the past 240 hours)  Resp panel by RT-PCR (RSV, Flu A&B, Covid) Anterior Nasal Swab     Status: None   Collection Time: 02/23/24  8:10 PM   Specimen: Anterior Nasal Swab  Result Value Ref Range Status   SARS Coronavirus 2 by RT PCR NEGATIVE NEGATIVE Final   Influenza A by PCR NEGATIVE NEGATIVE Final   Influenza B by PCR NEGATIVE NEGATIVE Final    Comment: (NOTE) The Xpert Xpress SARS-CoV-2/FLU/RSV plus assay is intended as an aid in the diagnosis of influenza from Nasopharyngeal swab specimens and should not be used as a sole  basis for treatment. Nasal washings and aspirates are unacceptable for Xpert Xpress SARS-CoV-2/FLU/RSV testing.  Fact Sheet for Patients: BloggerCourse.com  Fact Sheet for Healthcare Providers: SeriousBroker.it  This test is not yet approved or cleared by the [place] FDA and has been authorized for detection and/or diagnosis of SARS-CoV-2 by FDA under an Emergency Use Authorization (EUA). This EUA will remain in effect (meaning this test can be used) for the duration of the COVID-19 declaration under Section 564(b)(1) of the Act, 21 U.S.C. section 360bbb-3(b)(1), unless the authorization is terminated or revoked.     Resp Syncytial Virus by PCR NEGATIVE NEGATIVE Final    Comment: (NOTE) Fact Sheet for Patients: BloggerCourse.com  Fact Sheet for Healthcare Providers: SeriousBroker.it  This test is not yet approved or cleared by  the [employer] and has been authorized for detection and/or diagnosis of SARS-CoV-2 by FDA under an Emergency Use Authorization (EUA). This EUA will remain in effect (meaning this test can be used) for the duration of the COVID-19 declaration under Section 564(b)(1) of the Act, 21 U.S.C. section 360bbb-3(b)(1), unless the authorization is terminated or revoked.  Performed at [facility], [address] [place], [place] [address]   Blood Culture (routine x 2)     Status: None (Preliminary result)   Collection Time: 02/23/24  8:10 PM   Specimen: BLOOD  Result Value Ref Range Status   Specimen Description BLOOD RIGHT ANTECUBITAL  Final   Special Requests   Final    BOTTLES DRAWN AEROBIC AND ANAEROBIC Blood Culture results may not be optimal due to an inadequate volume of blood received in culture bottles   Culture   Final    NO GROWTH < 12 HOURS Performed at [facility], [address] [place], [place] [address]    Report  Status PENDING  Incomplete         Radiology Studies: MR LUMBAR SPINE WO CONTRAST Result Date: 02/24/2024 CLINICAL DATA:  63 year old female with altered mental status and neurologic deficit. EXAM: MRI LUMBAR SPINE WITHOUT CONTRAST TECHNIQUE: Multiplanar, multisequence MR imaging of the lumbar spine was performed. No intravenous contrast was administered. COMPARISON:  Lumbar MRI 07/07/2015. CT Abdomen and Pelvis 09/05/2022. FINDINGS: Segmentation: Normal on the prior CT, the same numbering system used in 2016. Alignment: Maintained lumbar lordosis with increased chronic spondylolisthesis at L4-L5. Anterolisthesis there now up to 6 mm versus subtle in 2016, stable to minimally increased since the 2023 CT. Mild chronic retrolisthesis of L5 on S1 is stable. Mild underlying dextroconvex lumbar scoliosis. Vertebrae: Maintained vertebral body height. Background bone marrow signal within normal limits. Superimposed chronic degenerative endplate marrow signal changes at multiple levels. No convincing marrow edema, acute osseous abnormality. Intact visible sacrum and SI joints. Conus medullaris and cauda equina: Conus extends to the L1 level. No lower spinal cord or conus signal abnormality. Capacious spinal canal at most levels, see details below. Generally normal cauda equina nerve roots. Paraspinal and other soft tissues: Negative. Disc levels: Lower thoracic generalized disc space loss, disc bulging and endplate spurring. No lower thoracic spinal stenosis through the T12-L1 level which is at the conus. L1-L2: Circumferential disc bulging and mild to moderate facet hypertrophy without significant stenosis. Trace degenerative facet joint fluid. L2-L3: Minimal disc bulging. Mild to moderate facet and ligament flavum hypertrophy. No stenosis. L3-L4: Mild leftward disc bulge or broad-based disc protrusion. Mild to moderate facet and ligament flavum hypertrophy. Degenerative facet joint fluid. Degenerative signal  changes in the interspinous ligament. No spinal stenosis. Borderline to mild left lateral recess stenosis (left L4 nerve level series 6, image 24). No foraminal stenosis. L4-L5: Anterolisthesis. Circumferential disc/pseudo disc. Severe facet hypertrophy. Moderate ligament flavum hypertrophy. Degenerative signal changes in the interspinous ligament. No significant spinal stenosis but mild to moderate bilateral lateral recess stenosis (L5 nerve levels). Mild bilateral L4 foraminal stenosis. L5-S1: Chronically advanced disc degeneration, vacuum disc. Circumferential disc osteophyte complex. Mild to moderate facet hypertrophy. No spinal stenosis. Mild lateral recess stenosis at the bilateral S1 nerve levels. Mild left but moderate to severe right L5 neural foraminal stenosis. IMPRESSION: 1. No acute osseous abnormality in the lumbar spine. 2. Chronic L4-L5 spondylolisthesis. Multilevel lumbar disc bulging. Multilevel moderate and occasionally severe facet degeneration. No significant spinal stenosis, but intermittent mild lateral recess stenosis  and moderate to severe right L5 nerve level foraminal stenosis. Electronically Signed   By: Odessa Fleming M.D.   On: 02/24/2024 10:50   DG CHEST PORT 1 VIEW Result Date: 02/24/2024 CLINICAL DATA:  [id number] Aspiration pneumonia (HCC) [id number]. EXAM: PORTABLE CHEST 1 VIEW COMPARISON:  02/23/2024. FINDINGS: There are heterogeneous alveolar and interstitial opacities throughout bilateral lung bases, left greater than right, without significant volume loss, concerning for multilobar pneumonia. Follow-up to clearing is recommended. No dense consolidation or lung collapse. Bilateral lateral costophrenic angles are clear. No pneumothorax. Normal cardio-mediastinal silhouette. No acute osseous abnormalities. Lower cervical spinal fixation hardware noted. The soft tissues are within normal limits. IMPRESSION: Findings favor multilobar pneumonia. Follow-up to clearing is recommended.  Electronically Signed   By: Jules Schick M.D.   On: 02/24/2024 10:28   MR BRAIN WO CONTRAST Result Date: 02/24/2024 CLINICAL DATA:  63 year old female with altered mental status, neurologic deficit. EXAM: MRI HEAD WITHOUT CONTRAST TECHNIQUE: Multiplanar, multiecho pulse sequences of the brain and surrounding structures were obtained without intravenous contrast. COMPARISON:  Head CT yesterday.  Brain MRI 10/17/2016. FINDINGS: Brain: Cerebral volume is within normal limits for age. No restricted diffusion to suggest acute infarction. No midline shift, mass effect, evidence of mass lesion, ventriculomegaly, extra-axial collection or acute intracranial hemorrhage. Cervicomedullary junction and pituitary are within normal limits. Wallace Cullens and white matter signal is within normal limits for age throughout the brain. No cortical encephalomalacia. No chronic cerebral blood products. Vascular: Major intracranial vascular flow voids are preserved, stable. Skull and upper cervical spine: Negative. Visualized bone marrow signal is within normal limits. Sinuses/Orbits: Postoperative changes to both globes since 2017. Minimal paranasal sinus disease. Other: Mastoids remain well aerated. Grossly normal visible internal auditory structures. Negative visible scalp and face. IMPRESSION: Normal for age noncontrast MRI appearance of the Brain. Electronically Signed   By: Odessa Fleming M.D.   On: 02/24/2024 05:23   CT Head Wo Contrast Result Date: 02/23/2024 CLINICAL DATA:  Mental status change, unknown cause EXAM: CT HEAD WITHOUT CONTRAST TECHNIQUE: Contiguous axial images were obtained from the base of the skull through the vertex without intravenous contrast. RADIATION DOSE REDUCTION: This exam was performed according to the departmental dose-optimization program which includes automated exposure control, adjustment of the mA and/or kV according to patient size and/or use of iterative reconstruction technique. COMPARISON:  None  Available. FINDINGS: Brain: No intracranial hemorrhage, mass effect, or midline shift. No hydrocephalus. The basilar cisterns are patent. No evidence of territorial infarct or acute ischemia. No extra-axial or intracranial fluid collection. Vascular: Atherosclerosis of skullbase vasculature without hyperdense vessel or abnormal calcification. Skull: No fracture or focal lesion. Sinuses/Orbits: No acute finding.  Bilateral cataract resection. Other: None. IMPRESSION: No acute intracranial abnormality. Electronically Signed   By: Narda Rutherford M.D.   On: 02/23/2024 22:25   DG Chest Portable 1 View Result Date: 02/23/2024 CLINICAL DATA:  SOB, hypoxia, rhonchi, OD EXAM: PORTABLE CHEST 1 VIEW COMPARISON:  Radiograph 03/20/2019, CT 09/05/2022 FINDINGS: Mild ill-defined patchy opacity at the left lung base. The heart is normal in size. Mediastinal contours are normal. No pulmonary edema, pleural effusion, or pneumothorax. On limited assessment, no acute osseous findings. IMPRESSION: Mild ill-defined patchy opacity at the left lung base, suspicious for pneumonia. Electronically Signed   By: Narda Rutherford M.D.   On: 02/23/2024 20:19        Scheduled Meds:  [START ON 02/25/2024] aspirin EC  81 mg Oral Daily   aspirin  325 mg Oral Daily  heparin  5,000 Units Subcutaneous Q8H   Continuous Infusions:  ampicillin-sulbactam (UNASYN) IV Stopped (02/24/24 1146)   doxycycline (VIBRAMYCIN) IV Stopped (02/24/24 1019)   lactated ringers 150 mL/hr at 02/24/24 0713     LOS: 0 days    Time spent: 55 minutes.    Berton Mount, MD  [facility] Pager #: [phone] 7PM-7AM contact night coverage as above

## 2024-02-25 DIAGNOSIS — T50901D Poisoning by unspecified drugs, medicaments and biological substances, accidental (unintentional), subsequent encounter: Secondary | ICD-10-CM | POA: Diagnosis not present

## 2024-02-25 LAB — MAGNESIUM: Magnesium: 1.4 mg/dL — ABNORMAL LOW (ref 1.7–2.4)

## 2024-02-25 LAB — CBC WITH DIFFERENTIAL/PLATELET
Abs Immature Granulocytes: 0.03 10*3/uL (ref 0.00–0.07)
Basophils Absolute: 0 10*3/uL (ref 0.0–0.1)
Basophils Relative: 0 %
Eosinophils Absolute: 0.2 10*3/uL (ref 0.0–0.5)
Eosinophils Relative: 2 %
HCT: 30.7 % — ABNORMAL LOW (ref 36.0–46.0)
Hemoglobin: 10.5 g/dL — ABNORMAL LOW (ref 12.0–15.0)
Immature Granulocytes: 0 %
Lymphocytes Relative: 4 %
Lymphs Abs: 0.4 10*3/uL — ABNORMAL LOW (ref 0.7–4.0)
MCH: 32.4 pg (ref 26.0–34.0)
MCHC: 34.2 g/dL (ref 30.0–36.0)
MCV: 94.8 fL (ref 80.0–100.0)
Monocytes Absolute: 0.5 10*3/uL (ref 0.1–1.0)
Monocytes Relative: 5 %
Neutro Abs: 8.8 10*3/uL — ABNORMAL HIGH (ref 1.7–7.7)
Neutrophils Relative %: 89 %
Platelets: 178 10*3/uL (ref 150–400)
RBC: 3.24 MIL/uL — ABNORMAL LOW (ref 3.87–5.11)
RDW: 12.3 % (ref 11.5–15.5)
WBC: 10 10*3/uL (ref 4.0–10.5)
nRBC: 0 % (ref 0.0–0.2)

## 2024-02-25 LAB — RENAL FUNCTION PANEL
Albumin: 3 g/dL — ABNORMAL LOW (ref 3.5–5.0)
Anion gap: 10 (ref 5–15)
BUN: 15 mg/dL (ref 8–23)
CO2: 24 mmol/L (ref 22–32)
Calcium: 9.2 mg/dL (ref 8.9–10.3)
Chloride: 107 mmol/L (ref 98–111)
Creatinine, Ser: 1.18 mg/dL — ABNORMAL HIGH (ref 0.44–1.00)
GFR, Estimated: 52 mL/min — ABNORMAL LOW (ref >60)
Glucose, Bld: 94 mg/dL (ref 70–99)
Phosphorus: 1.7 mg/dL — ABNORMAL LOW (ref 2.5–4.6)
Potassium: 3.7 mmol/L (ref 3.5–5.1)
Sodium: 141 mmol/L (ref 135–145)

## 2024-02-25 MED ORDER — THIAMINE MONONITRATE 100 MG PO TABS
100.0000 mg | ORAL_TABLET | Freq: Every day | ORAL | Status: DC
  Administered 2024-02-26 – 2024-03-10 (×14): 100 mg via ORAL
  Filled 2024-02-25 (×14): qty 1

## 2024-02-25 MED ORDER — DOXYCYCLINE HYCLATE 100 MG PO TABS
100.0000 mg | ORAL_TABLET | Freq: Two times a day (BID) | ORAL | Status: DC
  Administered 2024-02-25 – 2024-03-04 (×16): 100 mg via ORAL
  Filled 2024-02-25 (×16): qty 1

## 2024-02-25 MED ORDER — SODIUM CHLORIDE 0.9 % IV SOLN
3.0000 g | Freq: Three times a day (TID) | INTRAVENOUS | Status: DC
  Administered 2024-02-25 – 2024-03-02 (×17): 3 g via INTRAVENOUS
  Filled 2024-02-25 (×18): qty 8

## 2024-02-25 MED ORDER — POTASSIUM PHOSPHATES 15 MMOLE/5ML IV SOLN
30.0000 mmol | Freq: Once | INTRAVENOUS | Status: AC
  Administered 2024-02-25: 30 mmol via INTRAVENOUS
  Filled 2024-02-25: qty 10

## 2024-02-25 MED ORDER — ONDANSETRON 4 MG PO TBDP: 4.0000 mg | ORAL_TABLET | Freq: Four times a day (QID) | ORAL | Status: AC | PRN

## 2024-02-25 MED ORDER — LOPERAMIDE HCL 2 MG PO CAPS
2.0000 mg | ORAL_CAPSULE | ORAL | Status: AC | PRN
Start: 2024-02-25 — End: 2024-02-28
  Administered 2024-02-25: 2 mg via ORAL
  Administered 2024-02-26 – 2024-02-27 (×2): 4 mg via ORAL
  Administered 2024-02-27: 2 mg via ORAL
  Administered 2024-02-27: 4 mg via ORAL
  Filled 2024-02-25 (×3): qty 2
  Filled 2024-02-25 (×2): qty 1

## 2024-02-25 MED ORDER — HYDROXYZINE HCL 25 MG PO TABS
25.0000 mg | ORAL_TABLET | Freq: Four times a day (QID) | ORAL | Status: AC | PRN
  Administered 2024-02-27 – 2024-02-28 (×4): 25 mg via ORAL
  Filled 2024-02-25 (×5): qty 1

## 2024-02-25 MED ORDER — THIAMINE HCL 100 MG/ML IJ SOLN
100.0000 mg | Freq: Once | INTRAMUSCULAR | Status: AC
  Administered 2024-02-25: 100 mg via INTRAMUSCULAR
  Filled 2024-02-25: qty 2

## 2024-02-25 MED ORDER — ADULT MULTIVITAMIN W/MINERALS CH
1.0000 | ORAL_TABLET | ORAL | Status: DC
  Administered 2024-02-27 – 2024-03-10 (×13): 1 via ORAL
  Filled 2024-02-25 (×15): qty 1

## 2024-02-25 MED ORDER — LORAZEPAM 1 MG PO TABS
1.0000 mg | ORAL_TABLET | Freq: Four times a day (QID) | ORAL | Status: AC | PRN
  Administered 2024-02-25 – 2024-02-26 (×3): 1 mg via ORAL
  Filled 2024-02-25 (×4): qty 1

## 2024-02-25 MED ORDER — MAGNESIUM SULFATE 4 GM/100ML IV SOLN
4.0000 g | Freq: Once | INTRAVENOUS | Status: AC
  Administered 2024-02-25: 4 g via INTRAVENOUS
  Filled 2024-02-25: qty 100

## 2024-02-25 NOTE — Progress Notes (Signed)
 Patient having chronic pain left leg but hospitalitis not ready to order any pain meds due to possible intentional overdose.  Psy contacted to help with high agitation and aggression.  Ordered CIWA protocol.

## 2024-02-25 NOTE — Progress Notes (Signed)
 PHARMACY NOTE:  ANTIMICROBIAL RENAL DOSAGE ADJUSTMENT  Current antimicrobial regimen includes a mismatch between antimicrobial dosage and estimated renal function.  As per policy approved by the Pharmacy & Therapeutics and Medical Executive Committees, the antimicrobial dosage will be adjusted accordingly.  Current antimicrobial dosage:  ampicillin/sulbactam 3h q12  Indication: aspiration pna  Renal Function:  Estimated Creatinine Clearance: 43.3 mL/min (A) (by C-G formula based on SCr of 1.18 mg/dL (H)).   Antimicrobial dosage has been changed to:  3h q8hr  Additional comments:   Thank you for allowing pharmacy to be a part of this patient's care.  Leander Rams, [facility] 02/25/2024 3:40 PM

## 2024-02-25 NOTE — Progress Notes (Signed)
 Hospitalist aware that patient will not keep tele monitor attached.  Even in restraints is able to grab wire to pull off.

## 2024-02-25 NOTE — Consult Note (Signed)
 The client was too drowsy to assess, "She's out of it" per the sitter and her husband at her bedside.  She was able to briefly open her eyes.  Psych will continue to follow her and assess her when she can engage in the assessment.  Nanine Means, PMHNP

## 2024-02-25 NOTE — Plan of Care (Signed)
   Problem: Activity: Goal: Risk for activity intolerance will decrease Outcome: Progressing   Problem: Coping: Goal: Level of anxiety will decrease Outcome: Progressing   Problem: Pain Managment: Goal: General experience of comfort will improve and/or be controlled Outcome: Progressing

## 2024-02-25 NOTE — Progress Notes (Signed)
 Not able to give her oral pill (seroquel) as patient not opening to open her mouth for pill or water. Patient pulling her telemonitor wire,  tele monitoring on standby mode,

## 2024-02-25 NOTE — TOC Initial Note (Addendum)
 Transition of Care [facility]) - Initial/Assessment Note    Patient Details  Name: Sharon Huffman MRN: [id number] Date of Birth: 03/02/1961  [facility]) CM/SW Contact:    Epifanio Lesches, RN Phone Number: 02/25/2024, 11:04 AM  Clinical Narrative:                  Admitted with ? Intentional overdose, hx of ADHD,Anxiety,Depression,hypothyroidism,HLD, HTN, Lyme disease, Migraine. From home with husband. Pt with recent loss, mother died 4 weeks ago.  Pt confused. Supportive husband @ bedside. Husband states PTA independent with ADL's, no DME usage. States pt took care of mom for 10 yrs prior to pt's mom death.   Psych consult pending...   TOC team following and will assist with needs...  Expected Discharge Plan: Home/Self Care Barriers to Discharge: Continued Medical Work up   Patient Goals and CMS Choice            Expected Discharge Plan and Services   Discharge Planning Services: CM Consult   Living arrangements for the past 2 months: Single Family Home                                      Prior Living Arrangements/Services Living arrangements for the past 2 months: Single Family Home Lives with:: Spouse Patient language and need for interpreter reviewed:: Yes Do you feel safe going back to the place where you live?: Yes      Need for Family Participation in Patient Care: Yes (Comment) Care giver support system in place?: Yes (comment)   Criminal Activity/Legal Involvement Pertinent to Current Situation/Hospitalization: No - Comment as needed  Activities of Daily Living   ADL Screening (condition at time of admission) Independently performs ADLs?: No Does the patient have a NEW difficulty with bathing/dressing/toileting/self-feeding that is expected to last >3 days?: No (needs assist) Does the patient have a NEW difficulty with getting in/out of bed, walking, or climbing stairs that is expected to last >3 days?: No (needs assist) Does the  patient have a NEW difficulty with communication that is expected to last >3 days?: No Is the patient deaf or have difficulty hearing?: No Does the patient have difficulty seeing, even when wearing glasses/contacts?: No Does the patient have difficulty concentrating, remembering, or making decisions?: Yes  Permission Sought/Granted                  Emotional Assessment       Orientation: : Oriented to Self Alcohol / Substance Use: Not Applicable Psych Involvement: No (comment)  Admission diagnosis:  Overdose [T50.901A] Bilateral sciatica [M54.31, M54.32] Spondylolisthesis of lumbar region [M43.16] Pneumonia of left lower lobe due to infectious organism [J18.9] Drug overdose of undetermined intent, initial encounter [T50.904A] Acute hypoxic respiratory failure (HCC) [J96.01] Patient Active Problem List   Diagnosis Date Noted   Overdose 02/24/2024   Palpitations    Depression    DOE (dyspnea on exertion) 03/22/2019   Cough variant asthma vs uacs 03/20/2019   Visual disturbance 09/25/2016   Chronic tension-type headache, intractable 09/25/2016   Neck pain 06/14/2015   Back pain 06/14/2015   Chronic fatigue and malaise 02/23/2015   PCP:  Merri Brunette, MD Pharmacy:   [id number] - [place], [place] - [address] [place] [address] Phone: [phone] Fax: [phone]  [id number] - [place], [place] - [address] [place]  [address] Phone: [phone] Fax: [phone]     Social Drivers of Health (SDOH) Social History: SDOH Screenings   Food Insecurity: No Food Insecurity (02/24/2024)  Housing: Low Risk  (02/24/2024)  Transportation Needs: No Transportation Needs (02/24/2024)  Utilities: Not At Risk (02/24/2024)  Social Connections: Moderately Isolated (02/24/2024)  Tobacco Use: Medium Risk (02/23/2024)   SDOH Interventions:     Readmission Risk Interventions     No data to display

## 2024-02-25 NOTE — Progress Notes (Signed)
 Got patient up to bedside commode.  After getting back to bed was kicking and trying to bite myself and sitter.  Put back into four point restraints. Husband at bedside and agreed that she was being very aggressive.

## 2024-02-25 NOTE — Plan of Care (Signed)
 Patient is still very agitated and combative and verbally abusive.  At times attempt to kick but mostly just grabbing and hitting when able.  Constant attempts to pull out IV and pulls tele off at every chance.

## 2024-02-25 NOTE — Progress Notes (Signed)
 PROGRESS NOTE    Sharon Huffman  WUJ:[id number] DOB: Oct 25, 1961 DOA: 02/23/2024 PCP: Merri Brunette, MD  Outpatient Specialists:     Brief Narrative:  Patient is a 63 year old female past medical history significant for anxiety, depression, hyperlipidemia, hypertension, hypothyroidism, Lyme's disease, migraine headache and ADHD.  Patient was found down at home (unresponsive) by her husband.  Patient had agonal breathing, with O2 sat of 70% on nonrebreather.  Patient was administered IM Narcan 2 Mg and Glasgow Coma Scale improved from 4-13.  O2 sat improved to 90% on nonrebreather.  Patient lost her mother recently.  Patient is known to have access to morphine, Xanax and Norco.  There are concerns that the patient may have taken the medications.  Psychiatric team has been consulted.  02/24/2024: Patient seen alongside patient's husband.  Patient is awake and alert, patient remains drowsy.  Patient is not able to give significant history.  Patient vomiting reported.  Chest x-ray concerning for possible aspiration/multifocal pneumonia.  Patient is currently on IV Unasyn.  02/25/2024: Patient is awake, but remains drowsy.  Not able to give coherent history.   Assessment & Plan:   Principal Problem:   Overdose   Likely overdose: -Presumed intentional  -Complicated by Aspiration and acute hypoxic/hypercarbic respiratory failure  -Unasyn and doxycycline -place on 1 to 1 for Suicide  -s/p IVC  -psych consult   Acute kidney injury: -presumed pre-renal  - continue with ivfs  -hold nephrotoxic medications  -strict I/o  -UA revealed specific gravity of 1.016 and positive ketone. Will check urine sodium. 02/25/2024: AKI is resolving with hydration.  Serum creatinine has improved from 1.89-1.18.  Continue to monitor renal function and electrolytes.   Mild hyperkalemia -repeat pending  -Likely related to the AKI. 02/25/2024: Potassium of 3.7 today.   Hypothyroidism  -on synthroid , resume  medication s/p 24 hour observation    HLD -resume medication in 24 hours    ADHD Anxiety Depression -hold medications x24 hours  -psych to leave final recs     HTN -relative hypotension  -continue with ivfs  02/24/2024: Stable.    Migraine -stable no acute issues   Nausea and vomiting: -Zofran as needed.  Multifocal pneumonia: -Continue IV Unasyn and doxycycline.   DVT prophylaxis: Subcutaneous heparin Code Status: Full code Family Communication: Husband Disposition Plan: Telemetry inpatient   Consultants:  Psychiatry  Procedures:  None  Antimicrobials:  IV Unasyn Doxycycline   Subjective: Patient could not give coherent history.  Objective: Vitals:   02/24/24 1500 02/24/24 1821 02/24/24 2040 02/25/24 0644  BP: 119/60 112/61 136/80 130/80  Pulse: 82 91 88 95  Resp: 13 18 18 17   Temp:   98.2 F (36.8 C) 98.6 F (37 C)  TempSrc:   Axillary Oral  SpO2: 100% 100% 100% 97%  Weight:      Height:        Intake/Output Summary (Last 24 hours) at 02/25/2024 1903 Last data filed at 02/25/2024 1819 Gross per 24 hour  Intake 1764.42 ml  Output --  Net 1764.42 ml   Filed Weights   02/23/24 1927  Weight: 65.3 kg    Examination:  General exam: Awake and alert, but remains drowsy.  Bruises in upper and lower extremities. Respiratory system: Clear to auscultation.  Cardiovascular system: S1 & S2 heard Gastrointestinal system: Soft and nontender Central nervous system: Awake, alert, but drowsy. Extremities: Bruises of the extremities.  Minimal lower extremity edema.  Data Reviewed: I have personally reviewed following labs and imaging studies  CBC: Recent Labs  Lab 02/23/24 1922 02/23/24 1952 02/24/24 0129 02/25/24 0640  WBC 11.3*  --  14.8* 10.0  NEUTROABS 10.1*  --   --  8.8*  HGB 13.2 13.6  13.6 12.3 10.5*  HCT 41.9 40.0  40.0 38.4 30.7*  MCV 100.7*  --  99.5 94.8  PLT 246  --  203 178   Basic Metabolic Panel: Recent Labs  Lab  02/23/24 1952 02/23/24 2130 02/24/24 0129 02/24/24 0727 02/25/24 0640  NA 138  137 138  --   --  141  K 5.3*  5.3* 4.4 5.3* 4.9 3.7  CL 106 102  --   --  107  CO2  --  23  --   --  24  GLUCOSE 109* 106*  --   --  94  BUN 39* 26*  --   --  15  CREATININE 2.70* 2.09* 1.89*  --  1.18*  CALCIUM  --  9.1  --   --  9.2  MG  --   --   --   --  1.4*  PHOS  --   --   --   --  1.7*   GFR: Estimated Creatinine Clearance: 43.3 mL/min (A) (by C-G formula based on SCr of 1.18 mg/dL (H)). Liver Function Tests: Recent Labs  Lab 02/23/24 2130 02/25/24 0640  AST 27  --   ALT 18  --   ALKPHOS 56  --   BILITOT 1.0  --   PROT 5.5*  --   ALBUMIN 3.1* 3.0*   No results for input(s): "LIPASE", "AMYLASE" in the last 168 hours. No results for input(s): "AMMONIA" in the last 168 hours. Coagulation Profile: Recent Labs  Lab 02/23/24 1922  INR 1.0   Cardiac Enzymes: Recent Labs  Lab 02/24/24 2049  CKTOTAL 129   BNP (last 3 results) No results for input(s): "PROBNP" in the last 8760 hours. HbA1C: Recent Labs    02/24/24 0129  HGBA1C 5.7*   CBG: No results for input(s): "GLUCAP" in the last 168 hours. Lipid Profile: No results for input(s): "CHOL", "HDL", "LDLCALC", "TRIG", "CHOLHDL", "LDLDIRECT" in the last 72 hours. Thyroid Function Tests: Recent Labs    02/24/24 0727  TSH 1.088   Anemia Panel: No results for input(s): "VITAMINB12", "FOLATE", "FERRITIN", "TIBC", "IRON", "RETICCTPCT" in the last 72 hours. Urine analysis:    Component Value Date/Time   COLORURINE YELLOW 02/23/2024 1931   APPEARANCEUR CLEAR 02/23/2024 1931   LABSPEC 1.016 02/23/2024 1931   PHURINE 5.0 02/23/2024 1931   GLUCOSEU NEGATIVE 02/23/2024 1931   HGBUR NEGATIVE 02/23/2024 1931   BILIRUBINUR NEGATIVE 02/23/2024 1931   KETONESUR 5 (A) 02/23/2024 1931   PROTEINUR NEGATIVE 02/23/2024 1931   UROBILINOGEN 4.0 (H) 09/15/2014 1315   NITRITE NEGATIVE 02/23/2024 1931   LEUKOCYTESUR NEGATIVE 02/23/2024  1931   Sepsis Labs: @LABRCNTIP (procalcitonin:4,lacticidven:4)  ) Recent Results (from the past 240 hours)  Resp panel by RT-PCR (RSV, Flu A&B, Covid) Anterior Nasal Swab     Status: None   Collection Time: 02/23/24  8:10 PM   Specimen: Anterior Nasal Swab  Result Value Ref Range Status   SARS Coronavirus 2 by RT PCR NEGATIVE NEGATIVE Final   Influenza A by PCR NEGATIVE NEGATIVE Final   Influenza B by PCR NEGATIVE NEGATIVE Final    Comment: (NOTE) The Xpert Xpress SARS-CoV-2/FLU/RSV plus assay is intended as an aid in the diagnosis of influenza from Nasopharyngeal swab specimens and should not be used as a sole  basis for treatment. Nasal washings and aspirates are unacceptable for Xpert Xpress SARS-CoV-2/FLU/RSV testing.  Fact Sheet for Patients: BloggerCourse.com  Fact Sheet for Healthcare Providers: SeriousBroker.it  This test is not yet approved or cleared by the [place] FDA and has been authorized for detection and/or diagnosis of SARS-CoV-2 by FDA under an Emergency Use Authorization (EUA). This EUA will remain in effect (meaning this test can be used) for the duration of the COVID-19 declaration under Section 564(b)(1) of the Act, 21 U.S.C. section 360bbb-3(b)(1), unless the authorization is terminated or revoked.     Resp Syncytial Virus by PCR NEGATIVE NEGATIVE Final    Comment: (NOTE) Fact Sheet for Patients: BloggerCourse.com  Fact Sheet for Healthcare Providers: SeriousBroker.it  This test is not yet approved or cleared by the [place] FDA and has been authorized for detection and/or diagnosis of SARS-CoV-2 by FDA under an Emergency Use Authorization (EUA). This EUA will remain in effect (meaning this test can be used) for the duration of the COVID-19 declaration under Section 564(b)(1) of the Act, 21 U.S.C. section 360bbb-3(b)(1), unless the  authorization is terminated or revoked.  Performed at [facility], [address] [place], [place] [address]   Blood Culture (routine x 2)     Status: None (Preliminary result)   Collection Time: 02/23/24  8:10 PM   Specimen: BLOOD  Result Value Ref Range Status   Specimen Description BLOOD RIGHT ANTECUBITAL  Final   Special Requests   Final    BOTTLES DRAWN AEROBIC AND ANAEROBIC Blood Culture results may not be optimal due to an inadequate volume of blood received in culture bottles   Culture   Final    NO GROWTH 2 DAYS Performed at [facility], [address], [place], [place] [address]    Report Status PENDING  Incomplete         Radiology Studies: MR LUMBAR SPINE WO CONTRAST Result Date: 02/24/2024 CLINICAL DATA:  63 year old female with altered mental status and neurologic deficit. EXAM: MRI LUMBAR SPINE WITHOUT CONTRAST TECHNIQUE: Multiplanar, multisequence MR imaging of the lumbar spine was performed. No intravenous contrast was administered. COMPARISON:  Lumbar MRI 07/07/2015. CT Abdomen and Pelvis 09/05/2022. FINDINGS: Segmentation: Normal on the prior CT, the same numbering system used in 2016. Alignment: Maintained lumbar lordosis with increased chronic spondylolisthesis at L4-L5. Anterolisthesis there now up to 6 mm versus subtle in 2016, stable to minimally increased since the 2023 CT. Mild chronic retrolisthesis of L5 on S1 is stable. Mild underlying dextroconvex lumbar scoliosis. Vertebrae: Maintained vertebral body height. Background bone marrow signal within normal limits. Superimposed chronic degenerative endplate marrow signal changes at multiple levels. No convincing marrow edema, acute osseous abnormality. Intact visible sacrum and SI joints. Conus medullaris and cauda equina: Conus extends to the L1 level. No lower spinal cord or conus signal abnormality. Capacious spinal canal at most levels, see details below. Generally normal cauda equina nerve roots.  Paraspinal and other soft tissues: Negative. Disc levels: Lower thoracic generalized disc space loss, disc bulging and endplate spurring. No lower thoracic spinal stenosis through the T12-L1 level which is at the conus. L1-L2: Circumferential disc bulging and mild to moderate facet hypertrophy without significant stenosis. Trace degenerative facet joint fluid. L2-L3: Minimal disc bulging. Mild to moderate facet and ligament flavum hypertrophy. No stenosis. L3-L4: Mild leftward disc bulge or broad-based disc protrusion. Mild to moderate facet and ligament flavum hypertrophy. Degenerative facet joint fluid. Degenerative signal changes in the interspinous ligament. No spinal  stenosis. Borderline to mild left lateral recess stenosis (left L4 nerve level series 6, image 24). No foraminal stenosis. L4-L5: Anterolisthesis. Circumferential disc/pseudo disc. Severe facet hypertrophy. Moderate ligament flavum hypertrophy. Degenerative signal changes in the interspinous ligament. No significant spinal stenosis but mild to moderate bilateral lateral recess stenosis (L5 nerve levels). Mild bilateral L4 foraminal stenosis. L5-S1: Chronically advanced disc degeneration, vacuum disc. Circumferential disc osteophyte complex. Mild to moderate facet hypertrophy. No spinal stenosis. Mild lateral recess stenosis at the bilateral S1 nerve levels. Mild left but moderate to severe right L5 neural foraminal stenosis. IMPRESSION: 1. No acute osseous abnormality in the lumbar spine. 2. Chronic L4-L5 spondylolisthesis. Multilevel lumbar disc bulging. Multilevel moderate and occasionally severe facet degeneration. No significant spinal stenosis, but intermittent mild lateral recess stenosis and moderate to severe right L5 nerve level foraminal stenosis. Electronically Signed   By: Odessa Fleming M.D.   On: 02/24/2024 10:50   DG CHEST PORT 1 VIEW Result Date: 02/24/2024 CLINICAL DATA:  [id number] Aspiration pneumonia (HCC) [id number]. EXAM: PORTABLE CHEST 1  VIEW COMPARISON:  02/23/2024. FINDINGS: There are heterogeneous alveolar and interstitial opacities throughout bilateral lung bases, left greater than right, without significant volume loss, concerning for multilobar pneumonia. Follow-up to clearing is recommended. No dense consolidation or lung collapse. Bilateral lateral costophrenic angles are clear. No pneumothorax. Normal cardio-mediastinal silhouette. No acute osseous abnormalities. Lower cervical spinal fixation hardware noted. The soft tissues are within normal limits. IMPRESSION: Findings favor multilobar pneumonia. Follow-up to clearing is recommended. Electronically Signed   By: Jules Schick M.D.   On: 02/24/2024 10:28   MR BRAIN WO CONTRAST Result Date: 02/24/2024 CLINICAL DATA:  63 year old female with altered mental status, neurologic deficit. EXAM: MRI HEAD WITHOUT CONTRAST TECHNIQUE: Multiplanar, multiecho pulse sequences of the brain and surrounding structures were obtained without intravenous contrast. COMPARISON:  Head CT yesterday.  Brain MRI 10/17/2016. FINDINGS: Brain: Cerebral volume is within normal limits for age. No restricted diffusion to suggest acute infarction. No midline shift, mass effect, evidence of mass lesion, ventriculomegaly, extra-axial collection or acute intracranial hemorrhage. Cervicomedullary junction and pituitary are within normal limits. Wallace Cullens and white matter signal is within normal limits for age throughout the brain. No cortical encephalomalacia. No chronic cerebral blood products. Vascular: Major intracranial vascular flow voids are preserved, stable. Skull and upper cervical spine: Negative. Visualized bone marrow signal is within normal limits. Sinuses/Orbits: Postoperative changes to both globes since 2017. Minimal paranasal sinus disease. Other: Mastoids remain well aerated. Grossly normal visible internal auditory structures. Negative visible scalp and face. IMPRESSION: Normal for age noncontrast MRI  appearance of the Brain. Electronically Signed   By: Odessa Fleming M.D.   On: 02/24/2024 05:23   CT Head Wo Contrast Result Date: 02/23/2024 CLINICAL DATA:  Mental status change, unknown cause EXAM: CT HEAD WITHOUT CONTRAST TECHNIQUE: Contiguous axial images were obtained from the base of the skull through the vertex without intravenous contrast. RADIATION DOSE REDUCTION: This exam was performed according to the departmental dose-optimization program which includes automated exposure control, adjustment of the mA and/or kV according to patient size and/or use of iterative reconstruction technique. COMPARISON:  None Available. FINDINGS: Brain: No intracranial hemorrhage, mass effect, or midline shift. No hydrocephalus. The basilar cisterns are patent. No evidence of territorial infarct or acute ischemia. No extra-axial or intracranial fluid collection. Vascular: Atherosclerosis of skullbase vasculature without hyperdense vessel or abnormal calcification. Skull: No fracture or focal lesion. Sinuses/Orbits: No acute finding.  Bilateral cataract resection. Other: None. IMPRESSION: No acute intracranial  abnormality. Electronically Signed   By: Narda Rutherford M.D.   On: 02/23/2024 22:25   DG Chest Portable 1 View Result Date: 02/23/2024 CLINICAL DATA:  SOB, hypoxia, rhonchi, OD EXAM: PORTABLE CHEST 1 VIEW COMPARISON:  Radiograph 03/20/2019, CT 09/05/2022 FINDINGS: Mild ill-defined patchy opacity at the left lung base. The heart is normal in size. Mediastinal contours are normal. No pulmonary edema, pleural effusion, or pneumothorax. On limited assessment, no acute osseous findings. IMPRESSION: Mild ill-defined patchy opacity at the left lung base, suspicious for pneumonia. Electronically Signed   By: Narda Rutherford M.D.   On: 02/23/2024 20:19        Scheduled Meds:  aspirin EC  81 mg Oral Daily   doxycycline  100 mg Oral Q12H   heparin  5,000 Units Subcutaneous Q8H   multivitamin with minerals  1 tablet Oral  Q24H   QUEtiapine  25 mg Oral QHS   [START ON 02/26/2024] thiamine  100 mg Oral Daily   Continuous Infusions:  ampicillin-sulbactam (UNASYN) IV 3 g (02/25/24 1819)     LOS: 1 day    Time spent: 35 minutes.    Berton Mount, MD  [facility] Pager #: [phone] 7PM-7AM contact night coverage as above

## 2024-02-26 ENCOUNTER — Inpatient Hospital Stay (HOSPITAL_COMMUNITY)

## 2024-02-26 DIAGNOSIS — E860 Dehydration: Secondary | ICD-10-CM

## 2024-02-26 DIAGNOSIS — E039 Hypothyroidism, unspecified: Secondary | ICD-10-CM | POA: Diagnosis not present

## 2024-02-26 DIAGNOSIS — E785 Hyperlipidemia, unspecified: Secondary | ICD-10-CM

## 2024-02-26 DIAGNOSIS — E875 Hyperkalemia: Secondary | ICD-10-CM | POA: Diagnosis present

## 2024-02-26 DIAGNOSIS — F909 Attention-deficit hyperactivity disorder, unspecified type: Secondary | ICD-10-CM | POA: Diagnosis present

## 2024-02-26 DIAGNOSIS — F32A Depression, unspecified: Secondary | ICD-10-CM

## 2024-02-26 DIAGNOSIS — N179 Acute kidney failure, unspecified: Secondary | ICD-10-CM

## 2024-02-26 DIAGNOSIS — T50902D Poisoning by unspecified drugs, medicaments and biological substances, intentional self-harm, subsequent encounter: Secondary | ICD-10-CM

## 2024-02-26 DIAGNOSIS — R112 Nausea with vomiting, unspecified: Secondary | ICD-10-CM | POA: Insufficient documentation

## 2024-02-26 DIAGNOSIS — J690 Pneumonitis due to inhalation of food and vomit: Secondary | ICD-10-CM | POA: Insufficient documentation

## 2024-02-26 LAB — RENAL FUNCTION PANEL
Albumin: 3.5 g/dL (ref 3.5–5.0)
Anion gap: 16 — ABNORMAL HIGH (ref 5–15)
BUN: 11 mg/dL (ref 8–23)
CO2: 20 mmol/L — ABNORMAL LOW (ref 22–32)
Calcium: 9.6 mg/dL (ref 8.9–10.3)
Chloride: 104 mmol/L (ref 98–111)
Creatinine, Ser: 1.21 mg/dL — ABNORMAL HIGH (ref 0.44–1.00)
GFR, Estimated: 50 mL/min — ABNORMAL LOW (ref >60)
Glucose, Bld: 91 mg/dL (ref 70–99)
Phosphorus: 2.6 mg/dL (ref 2.5–4.6)
Potassium: 3.5 mmol/L (ref 3.5–5.1)
Sodium: 140 mmol/L (ref 135–145)

## 2024-02-26 LAB — CBC WITH DIFFERENTIAL/PLATELET
Abs Immature Granulocytes: 0.05 10*3/uL (ref 0.00–0.07)
Basophils Absolute: 0 10*3/uL (ref 0.0–0.1)
Basophils Relative: 0 %
Eosinophils Absolute: 0 10*3/uL (ref 0.0–0.5)
Eosinophils Relative: 0 %
HCT: 35.7 % — ABNORMAL LOW (ref 36.0–46.0)
Hemoglobin: 11.9 g/dL — ABNORMAL LOW (ref 12.0–15.0)
Immature Granulocytes: 0 %
Lymphocytes Relative: 3 %
Lymphs Abs: 0.4 10*3/uL — ABNORMAL LOW (ref 0.7–4.0)
MCH: 32 pg (ref 26.0–34.0)
MCHC: 33.3 g/dL (ref 30.0–36.0)
MCV: 96 fL (ref 80.0–100.0)
Monocytes Absolute: 0.4 10*3/uL (ref 0.1–1.0)
Monocytes Relative: 4 %
Neutro Abs: 10.5 10*3/uL — ABNORMAL HIGH (ref 1.7–7.7)
Neutrophils Relative %: 93 %
Platelets: 201 10*3/uL (ref 150–400)
RBC: 3.72 MIL/uL — ABNORMAL LOW (ref 3.87–5.11)
RDW: 12.3 % (ref 11.5–15.5)
WBC: 11.4 10*3/uL — ABNORMAL HIGH (ref 4.0–10.5)
nRBC: 0 % (ref 0.0–0.2)

## 2024-02-26 LAB — MAGNESIUM: Magnesium: 1.8 mg/dL (ref 1.7–2.4)

## 2024-02-26 MED ORDER — LEVOTHYROXINE SODIUM 50 MCG PO TABS
50.0000 ug | ORAL_TABLET | Freq: Every day | ORAL | Status: DC
  Administered 2024-02-27 – 2024-03-10 (×13): 50 ug via ORAL
  Filled 2024-02-26 (×13): qty 1

## 2024-02-26 MED ORDER — SODIUM CHLORIDE 0.9% FLUSH
10.0000 mL | Freq: Two times a day (BID) | INTRAVENOUS | Status: DC
  Administered 2024-02-26 – 2024-03-10 (×22): 10 mL

## 2024-02-26 MED ORDER — BISACODYL 10 MG RE SUPP
10.0000 mg | Freq: Once | RECTAL | Status: DC
  Filled 2024-02-26: qty 1

## 2024-02-26 MED ORDER — FAMOTIDINE 20 MG PO TABS
20.0000 mg | ORAL_TABLET | Freq: Every day | ORAL | Status: DC
  Administered 2024-02-26 – 2024-03-09 (×13): 20 mg via ORAL
  Filled 2024-02-26 (×13): qty 1

## 2024-02-26 MED ORDER — SODIUM CHLORIDE 0.9 % IV SOLN: INTRAVENOUS | Status: AC

## 2024-02-26 MED ORDER — SODIUM CHLORIDE 0.9% FLUSH: 10.0000 mL | INTRAVENOUS | Status: DC | PRN

## 2024-02-26 MED ORDER — SENNOSIDES-DOCUSATE SODIUM 8.6-50 MG PO TABS
1.0000 | ORAL_TABLET | Freq: Two times a day (BID) | ORAL | Status: DC
  Administered 2024-02-26 – 2024-02-27 (×2): 1 via ORAL
  Filled 2024-02-26 (×2): qty 1

## 2024-02-26 MED ORDER — POLYETHYLENE GLYCOL 3350 17 G PO PACK
17.0000 g | PACK | Freq: Every day | ORAL | Status: DC
  Administered 2024-02-27: 17 g via ORAL
  Filled 2024-02-26: qty 1

## 2024-02-26 MED ORDER — ROSUVASTATIN CALCIUM 5 MG PO TABS
10.0000 mg | ORAL_TABLET | Freq: Every day | ORAL | Status: DC
  Administered 2024-02-26 – 2024-03-10 (×14): 10 mg via ORAL
  Filled 2024-02-26 (×14): qty 2

## 2024-02-26 MED ORDER — MAGNESIUM SULFATE 2 GM/50ML IV SOLN
2.0000 g | Freq: Once | INTRAVENOUS | Status: AC
  Administered 2024-02-26: 2 g via INTRAVENOUS
  Filled 2024-02-26: qty 50

## 2024-02-26 MED ORDER — PANTOPRAZOLE SODIUM 40 MG PO TBEC
40.0000 mg | DELAYED_RELEASE_TABLET | Freq: Every day | ORAL | Status: DC
  Administered 2024-02-26 – 2024-03-10 (×14): 40 mg via ORAL
  Filled 2024-02-26 (×15): qty 1

## 2024-02-26 NOTE — Progress Notes (Signed)
 PROGRESS NOTE    Sharon Huffman  AVW:[id number] DOB: 08-02-1961 DOA: 02/23/2024 PCP: Merri Brunette, MD    Chief Complaint  Patient presents with   Drug Overdose    Brief Narrative:  Patient is a 63 year old female past medical history significant for anxiety, depression, hyperlipidemia, hypertension, hypothyroidism, Lyme's disease, migraine headache and ADHD.  Patient was found down at home (unresponsive) by her husband.  Patient had agonal breathing, with O2 sat of 70% on nonrebreather.  Patient was administered IM Narcan 2 Mg and Glasgow Coma Scale improved from 4-13.  O2 sat improved to 90% on nonrebreather.  Patient lost her mother recently.  Patient is known to have access to morphine, Xanax and Norco.  There are concerns that the patient may have taken the medications.  Psychiatric team has been consulted.   02/24/2024: Patient seen alongside patient's husband.  Patient is awake and alert, patient remains drowsy.  Patient is not able to give significant history.  Patient vomiting reported.  Chest x-ray concerning for possible aspiration/multifocal pneumonia.  Patient is currently on IV Unasyn.   02/25/2024: Patient is awake, but remains drowsy.  Not able to give coherent history.   Assessment & Plan:   Principal Problem:   Overdose Active Problems:   Depression   Hypophosphatemia   Hyperkalemia   Hypomagnesemia   ADHD   Aspiration pneumonia (HCC)   Nausea and vomiting   Dehydration   AKI (acute kidney injury) (HCC)   Hyperlipidemia   Hypothyroidism  #1 drug overdose -Present intentional. -Patient recently noted to have lost her mother who died in her arms per patient and since then patient has felt depressed and etiology of why she took medications however cannot quite tell me what specific medications and how much she took. -Patient currently denying any suicidal ideation or homicidal ideation at this time. -Drug overdose complicated by aspiration and patient on IV Unasyn  and doxycycline. -Continue one-to-one sitter. -Continue restraints for now and if continued clinical improvement could have a trial of restraints tomorrow. -Status post IVC. -Psychiatry consulted.  2.  Probable aspiration pneumonia -Continue IV Unasyn and doxycycline. -SLP evaluation. -Full liquid diet.  3.  AKI -Secondary to prerenal azotemia in the setting of spironolactone and Lasix as needed. -Patient clinically dry on examination. -Urinalysis nitrite negative, leukocytes negative, negative for protein, 0-5 WBCs, -Renal function improving with hydration. -Continue IV fluids.  4.  Dehydration -IV fluids.  5.  Hyperlipidemia -Resume medications in 24 hours if continued clinical improvement.  6.  Mild hyperkalemia -Likely secondary to AKI. -Resolved, potassium at 3.5.  7.  Hypophosphatemia/hypomagnesemia -Repleted.  8.  Hypertension -BP currently stable. -Continue to hold antihypertensive medications.  9.  ADHD/depression/anxiety -Continue to hold home regimen pending psychiatric evaluation.  10.  Nausea and vomiting -Zofran as needed. -Dulcolax suppository x 1. -Placed on Senokot-S twice daily as well as MiraLAX daily.  11.  Migraine headaches -Stable.  12.  Hypothyroidism -Resume home regimen Synthroid.     DVT prophylaxis: Heparin Code Status: Full Family Communication: Updated patient and nephew at bedside. Disposition: TBD  Status is: Inpatient Remains inpatient appropriate because: Severity of illness   Consultants:  Psychiatry pending  Procedures:  CT head 02/23/2024 Chest x-ray 02/23/2024, 02/24/2024 MRI L-spine 02/24/2024 MRI brain 02/24/2024  Antimicrobials:  Anti-infectives (From admission, onward)    Start     Dose/Rate Route Frequency Ordered Stop   02/25/24 2200  doxycycline (VIBRA-TABS) tablet 100 mg        100 mg Oral  Every 12 hours 02/25/24 1042     02/25/24 1800  Ampicillin-Sulbactam (UNASYN) 3 g in sodium chloride 0.9 % 100 mL  IVPB        3 g 200 mL/hr over 30 Minutes Intravenous Every 8 hours 02/25/24 1539     02/24/24 1000  Ampicillin-Sulbactam (UNASYN) 3 g in sodium chloride 0.9 % 100 mL IVPB  Status:  Discontinued        3 g 200 mL/hr over 30 Minutes Intravenous Every 12 hours 02/24/24 0127 02/25/24 1539   02/23/24 2000  cefTRIAXone (ROCEPHIN) 2 g in sodium chloride 0.9 % 100 mL IVPB        2 g 200 mL/hr over 30 Minutes Intravenous Once 02/23/24 1947 02/23/24 2059   02/23/24 2000  doxycycline (VIBRAMYCIN) 100 mg in sodium chloride 0.9 % 250 mL IVPB  Status:  Discontinued        100 mg 125 mL/hr over 120 Minutes Intravenous Every 12 hours 02/23/24 1947 02/25/24 1042         Subjective: Laying in bed.  Alert.  Denies any chest pain or shortness of breath.  Denies any homicidal or suicidal ideation.  Less agitated or combative today per sitter at bedside.  States was very depressed as her mother had just died in 01-24-24 in her arms and that is why she took medications.  Unable to fully list what exactly she took.  Nephew at bedside.  Patient noted with a bout of nausea and this morning.  Objective: Vitals:   02/25/24 1954 02/26/24 0609 02/26/24 0700 02/26/24 1717  BP: 130/80 139/84 137/81 139/80  Pulse: 99 (!) 103 95 100  Resp: 17 18 17 16   Temp: 97.7 F (36.5 C) 98.5 F (36.9 C) 98.6 F (37 C) 98.4 F (36.9 C)  TempSrc: Axillary Axillary Skin Oral  SpO2: 97% 100% 98% 94%  Weight:      Height:        Intake/Output Summary (Last 24 hours) at 02/26/2024 2050 Last data filed at 02/26/2024 1900 Gross per 24 hour  Intake 1337.08 ml  Output --  Net 1337.08 ml   Filed Weights   02/23/24 1927  Weight: 65.3 kg    Examination:  General exam: Appears calm and comfortable.  Dry mucous membranes. Respiratory system: Clear to auscultation anterior lung fields.  No wheezes, no crackles, no rhonchi.Marland Kitchen Respiratory effort normal. Cardiovascular system: S1 & S2 heard, RRR. No JVD, murmurs, rubs, gallops or  clicks. No pedal edema. Gastrointestinal system: Abdomen is nondistended, soft and nontender. No organomegaly or masses felt. Normal bowel sounds heard. Central nervous system: Alert and oriented.  Moving extremities spontaneously.  No focal neurological deficits. Extremities: Symmetric 5 x 5 power. Skin: No rashes, lesions or ulcers Psychiatry: Judgement and insight appear fair. Mood & affect depressed.     Data Reviewed: I have personally reviewed following labs and imaging studies  CBC: Recent Labs  Lab 02/23/24 1922 02/23/24 1952 02/24/24 0129 02/25/24 0640 02/26/24 0542  WBC 11.3*  --  14.8* 10.0 11.4*  NEUTROABS 10.1*  --   --  8.8* 10.5*  HGB 13.2 13.6  13.6 12.3 10.5* 11.9*  HCT 41.9 40.0  40.0 38.4 30.7* 35.7*  MCV 100.7*  --  99.5 94.8 96.0  PLT 246  --  203 178 201    Basic Metabolic Panel: Recent Labs  Lab 02/23/24 1952 02/23/24 2130 02/24/24 0129 02/24/24 0727 02/25/24 0640 02/26/24 0542  NA 138  137 138  --   --  141 140  K 5.3*  5.3* 4.4 5.3* 4.9 3.7 3.5  CL 106 102  --   --  107 104  CO2  --  23  --   --  24 20*  GLUCOSE 109* 106*  --   --  94 91  BUN 39* 26*  --   --  15 11  CREATININE 2.70* 2.09* 1.89*  --  1.18* 1.21*  CALCIUM  --  9.1  --   --  9.2 9.6  MG  --   --   --   --  1.4* 1.8  PHOS  --   --   --   --  1.7* 2.6    GFR: Estimated Creatinine Clearance: 42.3 mL/min (A) (by C-G formula based on SCr of 1.21 mg/dL (H)).  Liver Function Tests: Recent Labs  Lab 02/23/24 2130 02/25/24 0640 02/26/24 0542  AST 27  --   --   ALT 18  --   --   ALKPHOS 56  --   --   BILITOT 1.0  --   --   PROT 5.5*  --   --   ALBUMIN 3.1* 3.0* 3.5    CBG: No results for input(s): "GLUCAP" in the last 168 hours.   Recent Results (from the past 240 hours)  Resp panel by RT-PCR (RSV, Flu A&B, Covid) Anterior Nasal Swab     Status: None   Collection Time: 02/23/24  8:10 PM   Specimen: Anterior Nasal Swab  Result Value Ref Range Status   SARS  Coronavirus 2 by RT PCR NEGATIVE NEGATIVE Final   Influenza A by PCR NEGATIVE NEGATIVE Final   Influenza B by PCR NEGATIVE NEGATIVE Final    Comment: (NOTE) The Xpert Xpress SARS-CoV-2/FLU/RSV plus assay is intended as an aid in the diagnosis of influenza from Nasopharyngeal swab specimens and should not be used as a sole basis for treatment. Nasal washings and aspirates are unacceptable for Xpert Xpress SARS-CoV-2/FLU/RSV testing.  Fact Sheet for Patients: BloggerCourse.com  Fact Sheet for Healthcare Providers: SeriousBroker.it  This test is not yet approved or cleared by the [place] FDA and has been authorized for detection and/or diagnosis of SARS-CoV-2 by FDA under an Emergency Use Authorization (EUA). This EUA will remain in effect (meaning this test can be used) for the duration of the COVID-19 declaration under Section 564(b)(1) of the Act, 21 U.S.C. section 360bbb-3(b)(1), unless the authorization is terminated or revoked.     Resp Syncytial Virus by PCR NEGATIVE NEGATIVE Final    Comment: (NOTE) Fact Sheet for Patients: BloggerCourse.com  Fact Sheet for Healthcare Providers: SeriousBroker.it  This test is not yet approved or cleared by the [place] FDA and has been authorized for detection and/or diagnosis of SARS-CoV-2 by FDA under an Emergency Use Authorization (EUA). This EUA will remain in effect (meaning this test can be used) for the duration of the COVID-19 declaration under Section 564(b)(1) of the Act, 21 U.S.C. section 360bbb-3(b)(1), unless the authorization is terminated or revoked.  Performed at [facility], [address], [place], [place] [address]   Blood Culture (routine x 2)     Status: None (Preliminary result)   Collection Time: 02/23/24  8:10 PM   Specimen: BLOOD  Result Value Ref Range Status   Specimen Description BLOOD  RIGHT ANTECUBITAL  Final   Special Requests   Final    BOTTLES DRAWN AEROBIC AND ANAEROBIC Blood Culture results may not be optimal due to an  inadequate volume of blood received in culture bottles   Culture   Final    NO GROWTH 3 DAYS Performed at [facility], [address] [place], [place] [address]    Report Status PENDING  Incomplete         Radiology Studies: No results found.      Scheduled Meds:  aspirin EC  81 mg Oral Daily   bisacodyl  10 mg Rectal Once   doxycycline  100 mg Oral Q12H   heparin  5,000 Units Subcutaneous Q8H   multivitamin with minerals  1 tablet Oral Q24H   QUEtiapine  25 mg Oral QHS   sodium chloride flush  10-40 mL Intracatheter Q12H   thiamine  100 mg Oral Daily   Continuous Infusions:  sodium chloride 125 mL/hr at 02/26/24 1709   ampicillin-sulbactam (UNASYN) IV 3 g (02/26/24 1730)     LOS: 2 days    Time spent: 40 minutes    Ramiro Harvest, MD [facility]   To contact the attending provider between 7A-7P or the covering provider during after hours 7P-7A, please log into the web site [email or web address] and access using universal [place] password for that web site. If you do not have the password, please call the hospital operator.  02/26/2024, 8:50 PM

## 2024-02-26 NOTE — Evaluation (Signed)
 Clinical/Bedside Swallow Evaluation Patient Details  Name: Sharon Huffman MRN: [id number] Date of Birth: July 20, 1961  Today's Date: 02/26/2024 Time: SLP Start Time (ACUTE ONLY): 1403 SLP Stop Time (ACUTE ONLY): 1414 SLP Time Calculation (min) (ACUTE ONLY): 11 min  Past Medical History:  Past Medical History:  Diagnosis Date   ADHD    Anxiety    Bronchitis, chronic (HCC)    Cervical cancer (HCC)    Depression    Depression    Hyperlipidemia    Hypertension    Insomnia disorder    Lyme disease    Migraine    Ovarian cancer (HCC)    Palpitations    Past Surgical History:  Past Surgical History:  Procedure Laterality Date   ABDOMINAL HYSTERECTOMY  1994   ANTERIOR CERVICAL DECOMP/DISCECTOMY FUSION  2000   AUGMENTATION MAMMAPLASTY Bilateral 1998   BREAST ENHANCEMENT SURGERY Bilateral    EYE SURGERY  2016   PVD   HPI:  Pt is a 63 y.o. female admitted 02/23/24 via EMS for possible drug overdose. MRI negative. CXR concerning for PNA. PMH: anxiety, bronchitis, cervical cancer, depression, hyperlipidemia, HTN, lyme disease, migraine, ovarian cancer    Assessment / Plan / Recommendation  Clinical Impression  Pt reports symptoms of dysphagia PTA that include feeling like food or liquids don't always go down, having to swallow again. Her oral cavity is quite dry and she has generalized oral weakness. She took a few sips of water from a water bottle with mild anterior loss and transient oral holding, felt to be related to current mentation, but did still swallow without overt signs of aspiration. Pt quickly became too lethargic to continue the eval (discussed with sitter and RN, who said that pt had received ativan), so solids were deferred. Given that family and staff report no overt difficulties with liquids so far (except for difficulty getting liquid up via straw), will leave on current liquid diet already initiated by MD. This is to be consumed as when alert and accepting. SLP will f/u for  ongoing assessment and hopeful progression to more solid diet as mentation allows.   SLP Visit Diagnosis: Dysphagia, unspecified (R13.10)    Aspiration Risk       Diet Recommendation Thin liquid    Liquid Administration via: Cup;Straw Medication Administration: Whole meds with liquid Supervision: Staff to assist with self feeding Compensations: Minimize environmental distractions;Slow rate;Small sips/bites Postural Changes: Seated upright at 90 degrees    Other  Recommendations Oral Care Recommendations: Oral care BID    Recommendations for follow up therapy are one component of a multi-disciplinary discharge planning process, led by the attending physician.  Recommendations may be updated based on patient status, additional functional criteria and insurance authorization.  Follow up Recommendations  (TBA)      Assistance Recommended at Discharge    Functional Status Assessment Patient has had a recent decline in their functional status and demonstrates the ability to make significant improvements in function in a reasonable and predictable amount of time.  Frequency and Duration min 2x/week  2 weeks       Prognosis Prognosis for improved oropharyngeal function: Good Barriers to Reach Goals: Cognitive deficits      Swallow Study   General HPI: Pt is a 63 y.o. female admitted 02/23/24 via EMS for possible drug overdose. MRI negative. CXR concerning for PNA. PMH: anxiety, bronchitis, cervical cancer, depression, hyperlipidemia, HTN, lyme disease, migraine, ovarian cancer Type of Study: Bedside Swallow Evaluation Previous Swallow Assessment: none in chart Diet  Prior to this Study: Full liquid diet;Thin liquids (Level 0) Temperature Spikes Noted: No Respiratory Status: Room air History of Recent Intubation: No Behavior/Cognition: Lethargic/Drowsy;Requires cueing Oral Cavity Assessment: Dry Oral Care Completed by SLP: No Oral Cavity - Dentition: Adequate natural  dentition Self-Feeding Abilities: Total assist Patient Positioning: Upright in bed Baseline Vocal Quality: Low vocal intensity    Oral/Motor/Sensory Function Overall Oral Motor/Sensory Function: Generalized oral weakness   Ice Chips Ice chips: Not tested   Thin Liquid Thin Liquid: Impaired Presentation:  (water bottle) Oral Phase Impairments: Reduced labial seal Oral Phase Functional Implications: Oral holding;Other (comment) (anterior spillage)    Nectar Thick Nectar Thick Liquid: Not tested   Honey Thick Honey Thick Liquid: Not tested   Puree Puree: Not tested   Solid     Solid: Not tested      Mahala Menghini., M.A. CCC-SLP Acute Rehabilitation Services Office [phone]  Secure chat preferred  02/26/2024,2:24 PM

## 2024-02-26 NOTE — Consult Note (Signed)
 The client continues to be too drowsy to assess, psych will continue to follow.  Currently she is in restraints.  Her husband at her bedside reported she was "more subdued" and less agitated.  "She kinda has some data at times, facts are buried with the confusion".  He does feel she is doing better physically without so many medications , especially with opiates.  Contributes the client to downward spiraling the death of her mother 5 weeks ago that she was caring for.  This is when she started over taking her medications.    Nanine Means, PMHNP

## 2024-02-26 NOTE — Plan of Care (Signed)

## 2024-02-26 NOTE — Progress Notes (Signed)
 RN unable to perform suicide risk assessment due to patient having impaired mentation. RN ensures that patient is safe with 1:1 sitter at bedside. Patient resting in bed with adequate chest rise. No signs of distress noted.

## 2024-02-27 DIAGNOSIS — F32A Depression, unspecified: Secondary | ICD-10-CM | POA: Diagnosis not present

## 2024-02-27 DIAGNOSIS — E039 Hypothyroidism, unspecified: Secondary | ICD-10-CM | POA: Diagnosis not present

## 2024-02-27 DIAGNOSIS — F909 Attention-deficit hyperactivity disorder, unspecified type: Secondary | ICD-10-CM | POA: Diagnosis not present

## 2024-02-27 DIAGNOSIS — T50902D Poisoning by unspecified drugs, medicaments and biological substances, intentional self-harm, subsequent encounter: Secondary | ICD-10-CM | POA: Diagnosis not present

## 2024-02-27 DIAGNOSIS — E876 Hypokalemia: Secondary | ICD-10-CM

## 2024-02-27 LAB — CBC
HCT: 30.7 % — ABNORMAL LOW (ref 36.0–46.0)
Hemoglobin: 10.5 g/dL — ABNORMAL LOW (ref 12.0–15.0)
MCH: 32.1 pg (ref 26.0–34.0)
MCHC: 34.2 g/dL (ref 30.0–36.0)
MCV: 93.9 fL (ref 80.0–100.0)
Platelets: 201 10*3/uL (ref 150–400)
RBC: 3.27 MIL/uL — ABNORMAL LOW (ref 3.87–5.11)
RDW: 12.4 % (ref 11.5–15.5)
WBC: 8.4 10*3/uL (ref 4.0–10.5)
nRBC: 0 % (ref 0.0–0.2)

## 2024-02-27 LAB — BASIC METABOLIC PANEL
Anion gap: 9 (ref 5–15)
BUN: 6 mg/dL — ABNORMAL LOW (ref 8–23)
CO2: 20 mmol/L — ABNORMAL LOW (ref 22–32)
Calcium: 8.5 mg/dL — ABNORMAL LOW (ref 8.9–10.3)
Chloride: 114 mmol/L — ABNORMAL HIGH (ref 98–111)
Creatinine, Ser: 1 mg/dL (ref 0.44–1.00)
GFR, Estimated: >60 mL/min (ref >60)
Glucose, Bld: 94 mg/dL (ref 70–99)
Potassium: 3.5 mmol/L (ref 3.5–5.1)
Sodium: 143 mmol/L (ref 135–145)

## 2024-02-27 LAB — RENAL FUNCTION PANEL
Albumin: 2.8 g/dL — ABNORMAL LOW (ref 3.5–5.0)
Anion gap: 11 (ref 5–15)
BUN: 9 mg/dL (ref 8–23)
CO2: 19 mmol/L — ABNORMAL LOW (ref 22–32)
Calcium: 8.4 mg/dL — ABNORMAL LOW (ref 8.9–10.3)
Chloride: 109 mmol/L (ref 98–111)
Creatinine, Ser: 0.91 mg/dL (ref 0.44–1.00)
GFR, Estimated: >60 mL/min (ref >60)
Glucose, Bld: 92 mg/dL (ref 70–99)
Phosphorus: 2.9 mg/dL (ref 2.5–4.6)
Potassium: 2.8 mmol/L — ABNORMAL LOW (ref 3.5–5.1)
Sodium: 139 mmol/L (ref 135–145)

## 2024-02-27 LAB — MAGNESIUM: Magnesium: 1.9 mg/dL (ref 1.7–2.4)

## 2024-02-27 MED ORDER — POLYETHYLENE GLYCOL 3350 17 G PO PACK: 17.0000 g | PACK | Freq: Every day | ORAL | Status: DC | PRN

## 2024-02-27 MED ORDER — QUETIAPINE FUMARATE 25 MG PO TABS: 12.5000 mg | ORAL_TABLET | Freq: Two times a day (BID) | ORAL | Status: DC

## 2024-02-27 MED ORDER — ENOXAPARIN SODIUM 30 MG/0.3ML IJ SOSY
30.0000 mg | PREFILLED_SYRINGE | INTRAMUSCULAR | Status: DC
  Administered 2024-02-27: 30 mg via SUBCUTANEOUS
  Filled 2024-02-27: qty 0.3

## 2024-02-27 MED ORDER — POTASSIUM CHLORIDE CRYS ER 10 MEQ PO TBCR
40.0000 meq | EXTENDED_RELEASE_TABLET | ORAL | Status: AC
  Administered 2024-02-27 (×2): 40 meq via ORAL
  Filled 2024-02-27 (×2): qty 4

## 2024-02-27 MED ORDER — LORAZEPAM 0.5 MG PO TABS
0.5000 mg | ORAL_TABLET | Freq: Three times a day (TID) | ORAL | Status: DC
  Administered 2024-02-27 – 2024-02-28 (×5): 0.5 mg via ORAL
  Filled 2024-02-27 (×5): qty 1

## 2024-02-27 MED ORDER — GERHARDT'S BUTT CREAM
TOPICAL_CREAM | CUTANEOUS | Status: DC | PRN
  Administered 2024-03-07: 1 via TOPICAL
  Filled 2024-02-27 (×3): qty 60

## 2024-02-27 MED ORDER — GABAPENTIN 100 MG PO CAPS
100.0000 mg | ORAL_CAPSULE | Freq: Two times a day (BID) | ORAL | Status: DC
  Administered 2024-02-27 – 2024-03-10 (×24): 100 mg via ORAL
  Filled 2024-02-27 (×24): qty 1

## 2024-02-27 MED ORDER — SENNOSIDES-DOCUSATE SODIUM 8.6-50 MG PO TABS: 1.0000 | ORAL_TABLET | Freq: Every evening | ORAL | Status: DC | PRN

## 2024-02-27 MED ORDER — SENNOSIDES-DOCUSATE SODIUM 8.6-50 MG PO TABS: 1.0000 | ORAL_TABLET | Freq: Every day | ORAL | Status: DC

## 2024-02-27 NOTE — Evaluation (Signed)
 Physical Therapy Evaluation Patient Details Name: Sharon Huffman MRN: [id number] DOB: [birth year] Today's Date: 02/27/2024  History of Present Illness  Pt is a 63 y.o. female admitted 02/23/24 via EMS for possible drug overdose. PMH: anxiety, bronchitis, cervical cancer, depression, hyperlipidemia, HTN, lyme disease, migraine, ovarian cancer.  Clinical Impression  CIERRIA HEIGHT is 63 y.o. female admitted with above HPI and diagnosis. Patient is currently limited by functional impairments below (see PT problem list). Patient lives with spouse and has been rapidly declining in cognition and mobility over last 5 weeks and was independent with no AD ~2 months ago at baseline. Per spouse has had great decline since her mother passed ~5 weeks ago. Pt currently requires min assist for bed mobility and mod assist +2 for sit<>stand and pivot bed<>chair with RW. Pt unsteady with flexed posture in standing, difficulty sequencing pivot for transfer. Pt completed self care tasks in recliner with assist form OT. EOS pt returned to bed and 4-point restraints re-donned. Patient will benefit from continued skilled PT interventions to address impairments and progress independence with mobility. Patient will benefit from continued inpatient follow up therapy, <3 hours/day. Acute PT will follow and progress as able.         If plan is discharge home, recommend the following: A lot of help with walking and/or transfers;A lot of help with bathing/dressing/bathroom;Assistance with cooking/housework;Direct supervision/assist for medications management;Assist for transportation;Help with stairs or ramp for entrance;Supervision due to cognitive status   Can travel by private vehicle   No    Equipment Recommendations  (defer to next venue)  Recommendations for Other Services       Functional Status Assessment Patient has had a recent decline in their functional status and demonstrates the ability to make significant improvements  in function in a reasonable and predictable amount of time.     Precautions / Restrictions Precautions Precautions: Fall Precaution/Restrictions Comments: 4-point restraints Restrictions Weight Bearing Restrictions Per Provider Order: No      Mobility  Bed Mobility Overal bed mobility: Needs Assistance Bed Mobility: Supine to Sit, Sit to Supine     Supine to sit: Min assist, HOB elevated, Used rails Sit to supine: Min assist   General bed mobility comments: cues for technique and use of bed rail to turn and raise trunk.  min assist to raise LE's back onto bed EOS. Min assist to roll for towel placement.    Transfers Overall transfer level: Needs assistance Equipment used: Rolling walker (2 wheels) Transfers: Sit to/from Stand Sit to Stand: Mod assist           General transfer comment: Mod assist +2 for safety to power up and raise trunk upright. Mod assist to guide pivot bed>chair with pt maintaining flexed posture. pt often leaning anteriorly in chair and cues/assist needed for safety. Second Stand pivot transfer chair>bed at EOS.    Ambulation/Gait                  Stairs            Wheelchair Mobility     Tilt Bed    Modified Rankin (Stroke Patients Only)       Balance Overall balance assessment: Needs assistance Sitting-balance support: Feet supported, Bilateral upper extremity supported, No upper extremity supported Sitting balance-Leahy Scale: Fair     Standing balance support: During functional activity, Reliant on assistive device for balance, Bilateral upper extremity supported Standing balance-Leahy Scale: Poor  Pertinent Vitals/Pain Pain Assessment Pain Assessment: Faces Faces Pain Scale: Hurts little more Pain Location: Skin Pain Descriptors / Indicators: Constant, Discomfort, Grimacing, Guarding Pain Intervention(s): Limited activity within patient's tolerance, Monitored during  session, Repositioned    Home Living Family/patient expects to be discharged to:: Private residence Living Arrangements: Spouse/significant other Available Help at Discharge: Available PRN/intermittently;Family (Spouse works from home, and can assist) Type of Home: House Home Access: Level entry       Home Layout: Able to live on main level with bedroom/bathroom;One level;Full bath on main level Home Equipment: Rolling Walker (2 wheels);Wheelchair - manual;Shower seat;Grab bars - tub/shower;Hand held shower head (bed rail attachment to regular bed,) Additional Comments: pt's spouse has been assisting for ~5 weeks with pt progressively needing greater assistance.    Prior Function Prior Level of Function : Needs assist             Mobility Comments: pt has been requiring increased assist, no device. ADLs Comments: Per husband last 5 weeks pt was experiencing a decline both physically and cog. Before then she was independent w/ ADLs     Extremity/Trunk Assessment   Upper Extremity Assessment Upper Extremity Assessment: Right hand dominant;Generalized weakness;Defer to OT evaluation    Lower Extremity Assessment Lower Extremity Assessment: Generalized weakness    Cervical / Trunk Assessment Cervical / Trunk Assessment: Normal  Communication   Communication Communication: No apparent difficulties Factors Affecting Communication:  (disorganized thoughts and expression of thoughts)    Cognition Arousal: Alert, Suspect due to medications Behavior During Therapy: Restless, Impulsive   PT - Cognitive impairments: Orientation, Awareness, Memory, Attention, Initiation, Sequencing, Problem solving, Safety/Judgement   Orientation impairments: Situation, Time                     Following commands: Intact       Cueing Cueing Techniques: Verbal cues, Tactile cues, Visual cues     General Comments General comments (skin integrity, edema, etc.): buttocks noted to be  red and raw, recommend no mesh underwear or pad, recommend gerhardts butt cream to RN.    Exercises     Assessment/Plan    PT Assessment Patient needs continued PT services  PT Problem List Decreased strength;Decreased activity tolerance;Decreased balance;Decreased mobility;Decreased cognition;Decreased coordination;Decreased knowledge of use of DME;Decreased safety awareness;Decreased knowledge of precautions;Obesity;Decreased skin integrity       PT Treatment Interventions DME instruction;Gait training;Stair training;Functional mobility training;Therapeutic activities;Therapeutic exercise;Balance training;Neuromuscular re-education;Cognitive remediation;Patient/family education    PT Goals (Current goals can be found in the Care Plan section)  Acute Rehab PT Goals PT Goal Formulation: With patient Time For Goal Achievement: 03/12/24 Potential to Achieve Goals: Fair    Frequency Min 2X/week     Co-evaluation PT/OT/SLP Co-Evaluation/Treatment: Yes Reason for Co-Treatment: Complexity of the patient's impairments (multi-system involvement);For patient/therapist safety;To address functional/ADL transfers;Necessary to address cognition/behavior during functional activity   OT goals addressed during session: ADL's and self-care       AM-PAC PT "6 Clicks" Mobility  Outcome Measure Help needed turning from your back to your side while in a flat bed without using bedrails?: A Little Help needed moving from lying on your back to sitting on the side of a flat bed without using bedrails?: A Little Help needed moving to and from a bed to a chair (including a wheelchair)?: A Lot Help needed standing up from a chair using your arms (e.g., wheelchair or bedside chair)?: A Lot Help needed to walk in hospital room?: A Lot Help  needed climbing 3-5 steps with a railing? : Total 6 Click Score: 13    End of Session Equipment Utilized During Treatment: Gait belt Activity Tolerance: Patient  tolerated treatment well Patient left: with call bell/phone within reach;with family/visitor present Nurse Communication: Mobility status PT Visit Diagnosis: Other abnormalities of gait and mobility (R26.89);Muscle weakness (generalized) (M62.81);Difficulty in walking, not elsewhere classified (R26.2);Other symptoms and signs involving the nervous system (R29.898)    Time: 5427-0623 PT Time Calculation (min) (ACUTE ONLY): 49 min   Charges:   PT Evaluation $PT Eval Moderate Complexity: 1 Mod PT Treatments $Therapeutic Activity: 8-22 mins PT General Charges $$ ACUTE PT VISIT: 1 Visit         Wynn Maudlin, DPT Acute Rehabilitation Services Office [phone]  02/27/24 12:48 PM

## 2024-02-27 NOTE — Progress Notes (Signed)
 PROGRESS NOTE    Sharon Huffman  WGN:[id number] DOB: [birth year] DOA: 02/23/2024 PCP: Merri Brunette, MD    Chief Complaint  Patient presents with   Drug Overdose    Brief Narrative:  Patient is a 63 year old female past medical history significant for anxiety, depression, hyperlipidemia, hypertension, hypothyroidism, Lyme's disease, migraine headache and ADHD.  Patient was found down at home (unresponsive) by her husband.  Patient had agonal breathing, with O2 sat of 70% on nonrebreather.  Patient was administered IM Narcan 2 Mg and Glasgow Coma Scale improved from 4-13.  O2 sat improved to 90% on nonrebreather.  Patient lost her mother recently.  Patient is known to have access to morphine, Xanax and Norco.  There are concerns that the patient may have taken the medications.  Psychiatric team has been consulted.   02/24/2024: Patient seen alongside patient's husband.  Patient is awake and alert, patient remains drowsy.  Patient is not able to give significant history.  Patient vomiting reported.  Chest x-ray concerning for possible aspiration/multifocal pneumonia.  Patient is currently on IV Unasyn.   02/25/2024: Patient is awake, but remains drowsy.  Not able to give coherent history.   Assessment & Plan:   Principal Problem:   Overdose Active Problems:   Depression   Hypophosphatemia   Hyperkalemia   Hypomagnesemia   ADHD   Aspiration pneumonia (HCC)   Nausea and vomiting   Dehydration   AKI (acute kidney injury) (HCC)   Hyperlipidemia   Hypothyroidism  #1 drug overdose -Likely intentional intentional. -Patient recently noted to have lost her mother who died in her arms per patient and since then patient has felt depressed and etiology of why she took medications however cannot quite tell me what specific medications and how much she took. -Patient currently denying any suicidal ideation or homicidal ideation at this time. -Drug overdose complicated by aspiration and patient on  IV Unasyn and doxycycline. -Continue one-to-one sitter. -Continue restraints for now and if continued clinical improvement could have a trial of restraints tomorrow. -Status post IVC. -Psychiatry consulted.  2.  Probable aspiration pneumonia -Continue IV Unasyn and doxycycline. -SLP following.   -Continue full liquid diet for now.   3.  AKI -Secondary to prerenal azotemia in the setting of spironolactone and Lasix as needed. -Patient clinically dry on examination. -Urinalysis nitrite negative, leukocytes negative, negative for protein, 0-5 WBCs, -Renal function improved with hydration.    4.  Dehydration -IV fluids.  5.  Hyperlipidemia -Continue statin.   6.  Mild hyperkalemia -Likely secondary to AKI. -Resolved.  7.  Hypophosphatemia/hypomagnesemia -Repleted.  8.  Hypertension -BP currently stable. -Continue to hold antihypertensive medications.  9.  ADHD/depression/anxiety -Patient noted to be hallucinating per RN and NT today. -Patient started on Ativan 0.5 mg 3 times daily per psychiatry to avoid withdrawal as patient noted to be on Xanax as needed prior to admission. -Continue to hold home regimen pending psychiatric evaluation.  10.  Nausea and vomiting -Clinical improvement.   -No further nausea or vomiting.   -Status post Dulcolax suppository x 1.   -Change MiraLAX and Senokot-S to as needed.   -IV fluids, supportive care.   11.  Migraine headaches -Stable.  12.  Hypothyroidism -Continue Synthroid.  13.  Hypokalemia -Secondary to GI losses as patient noted to have multiple watery loose stools per RN and sitter at bedside. -Change laxatives to as needed. -K-Dur 40 mill equivalents p.o. every 4 hours x 2 doses.   -Repeat labs in the AM.  14.  Diarrhea -Patient with multiple watery loose stools on laxatives. -Change laxatives to as needed.     DVT prophylaxis: Heparin>>> Lovenox Code Status: Full Family Communication: No family at  bedside. Disposition: TBD  Status is: Inpatient Remains inpatient appropriate because: Severity of illness   Consultants:  Psychiatry pending  Procedures:  CT head 02/23/2024 Chest x-ray 02/23/2024, 02/24/2024 MRI L-spine 02/24/2024 MRI brain 02/24/2024  Antimicrobials:  Anti-infectives (From admission, onward)    Start     Dose/Rate Route Frequency Ordered Stop   02/25/24 2200  doxycycline (VIBRA-TABS) tablet 100 mg        100 mg Oral Every 12 hours 02/25/24 1042     02/25/24 1800  Ampicillin-Sulbactam (UNASYN) 3 g in sodium chloride 0.9 % 100 mL IVPB        3 g 200 mL/hr over 30 Minutes Intravenous Every 8 hours 02/25/24 1539     02/24/24 1000  Ampicillin-Sulbactam (UNASYN) 3 g in sodium chloride 0.9 % 100 mL IVPB  Status:  Discontinued        3 g 200 mL/hr over 30 Minutes Intravenous Every 12 hours 02/24/24 0127 02/25/24 1539   02/23/24 2000  cefTRIAXone (ROCEPHIN) 2 g in sodium chloride 0.9 % 100 mL IVPB        2 g 200 mL/hr over 30 Minutes Intravenous Once 02/23/24 1947 02/23/24 2059   02/23/24 2000  doxycycline (VIBRAMYCIN) 100 mg in sodium chloride 0.9 % 250 mL IVPB  Status:  Discontinued        100 mg 125 mL/hr over 120 Minutes Intravenous Every 12 hours 02/23/24 1947 02/25/24 1042         Subjective: Sleeping but arousable.  More alert today.  Denies any chest pain or shortness of breath.  Denies any homicidal or suicidal ideation.  Noted to have multiple loose stools.  Sitter at bedside.  No further nausea or vomiting.  Per RN patient with some hallucinations.  Objective: Vitals:   02/26/24 1717 02/26/24 2100 02/27/24 0525 02/27/24 0823  BP: 139/80 139/80 136/87 136/78  Pulse: 100 97 (!) 107 94  Resp: 16 16 17 16   Temp: 98.4 F (36.9 C) 98.2 F (36.8 C)  98.5 F (36.9 C)  TempSrc: Oral Oral  Oral  SpO2: 94% 96% 98% 100%  Weight:      Height:        Intake/Output Summary (Last 24 hours) at 02/27/2024 1410 Last data filed at 02/27/2024 1024 Gross per 24 hour   Intake 1617.08 ml  Output --  Net 1617.08 ml   Filed Weights   02/23/24 1927  Weight: 65.3 kg    Examination:  General exam: Appears calm and comfortable.  Dry mucous membranes. Respiratory system: Clear to auscultation bilaterally.  No wheezes, no crackles, no rhonchi.  Fair air movement.  Speaking in full sentences.  Cardiovascular system: RRR no murmurs rubs or gallops.  No JVD.  No lower extremity edema.  Gastrointestinal system: Abdomen is soft, nontender, nondistended, positive bowel sounds.  No rebound.  No guarding.  Central nervous system: Alert and oriented.  Moving extremities spontaneously.  No focal neurological deficits. Extremities: Symmetric 5 x 5 power. Skin: No rashes, lesions or ulcers Psychiatry: Judgement and insight appear poor to fair. Mood & affect depressed.     Data Reviewed: I have personally reviewed following labs and imaging studies  CBC: Recent Labs  Lab 02/23/24 1922 02/23/24 1952 02/24/24 0129 02/25/24 0640 02/26/24 0542 02/27/24 0339  WBC 11.3*  --  14.8* 10.0 11.4* 8.4  NEUTROABS 10.1*  --   --  8.8* 10.5*  --   HGB 13.2 13.6  13.6 12.3 10.5* 11.9* 10.5*  HCT 41.9 40.0  40.0 38.4 30.7* 35.7* 30.7*  MCV 100.7*  --  99.5 94.8 96.0 93.9  PLT 246  --  203 178 201 201    Basic Metabolic Panel: Recent Labs  Lab 02/23/24 1952 02/23/24 2130 02/24/24 0129 02/24/24 0727 02/25/24 0640 02/26/24 0542 02/27/24 0339  NA 138  137 138  --   --  141 140 139  K 5.3*  5.3* 4.4 5.3* 4.9 3.7 3.5 2.8*  CL 106 102  --   --  107 104 109  CO2  --  23  --   --  24 20* 19*  GLUCOSE 109* 106*  --   --  94 91 92  BUN 39* 26*  --   --  15 11 9   CREATININE 2.70* 2.09* 1.89*  --  1.18* 1.21* 0.91  CALCIUM  --  9.1  --   --  9.2 9.6 8.4*  MG  --   --   --   --  1.4* 1.8 1.9  PHOS  --   --   --   --  1.7* 2.6 2.9    GFR: Estimated Creatinine Clearance: 56.2 mL/min (by C-G formula based on SCr of 0.91 mg/dL).  Liver Function Tests: Recent Labs   Lab 02/23/24 2130 02/25/24 0640 02/26/24 0542 02/27/24 0339  AST 27  --   --   --   ALT 18  --   --   --   ALKPHOS 56  --   --   --   BILITOT 1.0  --   --   --   PROT 5.5*  --   --   --   ALBUMIN 3.1* 3.0* 3.5 2.8*    CBG: No results for input(s): "GLUCAP" in the last 168 hours.   Recent Results (from the past 240 hours)  Resp panel by RT-PCR (RSV, Flu A&B, Covid) Anterior Nasal Swab     Status: None   Collection Time: 02/23/24  8:10 PM   Specimen: Anterior Nasal Swab  Result Value Ref Range Status   SARS Coronavirus 2 by RT PCR NEGATIVE NEGATIVE Final   Influenza A by PCR NEGATIVE NEGATIVE Final   Influenza B by PCR NEGATIVE NEGATIVE Final    Comment: (NOTE) The Xpert Xpress SARS-CoV-2/FLU/RSV plus assay is intended as an aid in the diagnosis of influenza from Nasopharyngeal swab specimens and should not be used as a sole basis for treatment. Nasal washings and aspirates are unacceptable for Xpert Xpress SARS-CoV-2/FLU/RSV testing.  Fact Sheet for Patients: BloggerCourse.com  Fact Sheet for Healthcare Providers: SeriousBroker.it  This test is not yet approved or cleared by the [place] FDA and has been authorized for detection and/or diagnosis of SARS-CoV-2 by FDA under an Emergency Use Authorization (EUA). This EUA will remain in effect (meaning this test can be used) for the duration of the COVID-19 declaration under Section 564(b)(1) of the Act, 21 U.S.C. section 360bbb-3(b)(1), unless the authorization is terminated or revoked.     Resp Syncytial Virus by PCR NEGATIVE NEGATIVE Final    Comment: (NOTE) Fact Sheet for Patients: BloggerCourse.com  Fact Sheet for Healthcare Providers: SeriousBroker.it  This test is not yet approved or cleared by the [place] FDA and has been authorized for detection and/or diagnosis of SARS-CoV-2 by FDA under an  Emergency  Use Authorization (EUA). This EUA will remain in effect (meaning this test can be used) for the duration of the COVID-19 declaration under Section 564(b)(1) of the Act, 21 U.S.C. section 360bbb-3(b)(1), unless the authorization is terminated or revoked.  Performed at [facility], [address], [place], [place] [address]   Blood Culture (routine x 2)     Status: None (Preliminary result)   Collection Time: 02/23/24  8:10 PM   Specimen: BLOOD  Result Value Ref Range Status   Specimen Description BLOOD RIGHT ANTECUBITAL  Final   Special Requests   Final    BOTTLES DRAWN AEROBIC AND ANAEROBIC Blood Culture results may not be optimal due to an inadequate volume of blood received in culture bottles   Culture   Final    NO GROWTH 4 DAYS Performed at [facility], [address] [place], [place] [address]    Report Status PENDING  Incomplete         Radiology Studies: DG Abd Portable 1V Result Date: 02/26/2024 CLINICAL DATA:  Nausea and vomiting. EXAM: PORTABLE ABDOMEN - 1 VIEW COMPARISON:  None Available. FINDINGS: Air scattered throughout nondilated small and large bowel. No evidence of obstruction. Minimal formed stool in the colon. No visible radiopaque calculi or abnormal soft tissue calcifications. IMPRESSION: Nonobstructive bowel gas pattern. Electronically Signed   By: Narda Rutherford M.D.   On: 02/26/2024 22:50        Scheduled Meds:  aspirin EC  81 mg Oral Daily   bisacodyl  10 mg Rectal Once   doxycycline  100 mg Oral Q12H   famotidine  20 mg Oral QHS   heparin  5,000 Units Subcutaneous Q8H   levothyroxine  50 mcg Oral QAC breakfast   multivitamin with minerals  1 tablet Oral Q24H   pantoprazole  40 mg Oral Daily   polyethylene glycol  17 g Oral Daily   QUEtiapine  25 mg Oral QHS   rosuvastatin  10 mg Oral Daily   senna-docusate  1 tablet Oral BID   sodium chloride flush  10-40 mL Intracatheter Q12H   thiamine  100 mg Oral Daily    Continuous Infusions:  ampicillin-sulbactam (UNASYN) IV 3 g (02/27/24 0831)     LOS: 3 days    Time spent: 40 minutes    Ramiro Harvest, MD [facility]   To contact the attending provider between 7A-7P or the covering provider during after hours 7P-7A, please log into the web site [email or web address] and access using universal [place] password for that web site. If you do not have the password, please call the hospital operator.  02/27/2024, 2:10 PM

## 2024-02-27 NOTE — Progress Notes (Signed)
 SLP Cancellation Note  Patient Details Name: MARSHA GUNDLACH MRN: [id number] DOB: 05/25/1961   Cancelled treatment:       Reason Eval/Treat Not Completed: Patient's level of consciousness Patient not adequately alert and per NT in room, she has been having delusions/hallucinations such as seeing her mother who had passed away 5 weeks ago, and has been fluctuating between sleeping and brief periods of alertness. SLP will continue efforts.   Angela Nevin, MA, CCC-SLP Speech Therapy

## 2024-02-27 NOTE — Evaluation (Signed)
 Occupational Therapy Evaluation Patient Details Name: Sharon Huffman MRN: [id number] DOB: [birth year] Today's Date: 02/27/2024   History of Present Illness   Pt is a 63 y.o. female admitted 02/23/24 via EMS for possible drug overdose. PMH: anxiety, bronchitis, cervical cancer, depression, hyperlipidemia, HTN, lyme disease, migraine, ovarian cancer.     Clinical Impressions Pt admitted based on above, and was seen based on problem list below. In the last 5 weeks per husband, pt has experienced a decline in ADL, physical, and mental status.  He reports she does not like to bathe because it hurts her skin, and has required min-mod assist for ADLs. Prior to then she was I. Today pt is requiring set up to total assist for ADLs. Bed mobility was min assist and functional transfers were mod assist +2. Constant cueing required for sequencing, problem solving, attention, and safety.  Recommendation of <3hours of skilled rehab daily to optimize independence levels. OT will continue to follow acutely to maximize functional independence.     If plan is discharge home, recommend the following:   Two people to help with walking and/or transfers;Assistance with cooking/housework;A lot of help with bathing/dressing/bathroom;Assist for transportation;Direct supervision/assist for medications management;Direct supervision/assist for financial management;Supervision due to cognitive status     Functional Status Assessment   Patient has had a recent decline in their functional status and demonstrates the ability to make significant improvements in function in a reasonable and predictable amount of time.     Equipment Recommendations   BSC/3in1     Recommendations for Other Services         Precautions/Restrictions   Precautions Precautions: Fall Precaution/Restrictions Comments: 4-point restraints Restrictions Weight Bearing Restrictions Per Provider Order: No     Mobility Bed  Mobility Overal bed mobility: Needs Assistance Bed Mobility: Supine to Sit, Sit to Supine     Supine to sit: Min assist, HOB elevated, Used rails Sit to supine: Min assist   General bed mobility comments: Cues for sequencing    Transfers Overall transfer level: Needs assistance Equipment used: Rolling walker (2 wheels) Transfers: Sit to/from Stand Sit to Stand: Mod assist           General transfer comment: Mod assist +2 with heavy cues for safety      Balance Overall balance assessment: Needs assistance Sitting-balance support: Feet supported, Bilateral upper extremity supported, No upper extremity supported Sitting balance-Leahy Scale: Fair     Standing balance support: During functional activity, Reliant on assistive device for balance, Bilateral upper extremity supported Standing balance-Leahy Scale: Poor Standing balance comment: With RW                           ADL either performed or assessed with clinical judgement   ADL Overall ADL's : Needs assistance/impaired Eating/Feeding: Set up;Sitting   Grooming: Wash/dry face;Set up;Cueing for sequencing;Sitting Grooming Details (indicate cue type and reason): Constant cueing for attention Upper Body Bathing: Set up;Cueing for sequencing;Sitting Upper Body Bathing Details (indicate cue type and reason): Cueing for attention Lower Body Bathing: Contact guard assist;Cueing for safety;Cueing for sequencing;Sitting/lateral leans Lower Body Bathing Details (indicate cue type and reason): CGA to reach lower legs while seated Upper Body Dressing : Minimal assistance;Sitting;Cueing for sequencing Upper Body Dressing Details (indicate cue type and reason): Assist to don over IV Lower Body Dressing: Moderate assistance   Toilet Transfer: Moderate assistance;+2 for physical assistance Toilet Transfer Details (indicate cue type and reason): Simulated in room cues  & +  2 for safety Toileting- Clothing Manipulation  and Hygiene: Total assistance;Sit to/from stand;+2 for physical assistance Toileting - Clothing Manipulation Details (indicate cue type and reason): +2 for safety             Vision Baseline Vision/History: 0 No visual deficits Vision Assessment?: No apparent visual deficits     Perception         Praxis         Pertinent Vitals/Pain Pain Assessment Pain Assessment: Faces Faces Pain Scale: Hurts little more Pain Location: Skin Pain Descriptors / Indicators: Constant, Discomfort, Grimacing, Guarding Pain Intervention(s): Limited activity within patient's tolerance, Monitored during session     Extremity/Trunk Assessment Upper Extremity Assessment Upper Extremity Assessment: Right hand dominant;Generalized weakness;Defer to OT evaluation   Lower Extremity Assessment Lower Extremity Assessment: Generalized weakness   Cervical / Trunk Assessment Cervical / Trunk Assessment: Normal   Communication Communication Communication: No apparent difficulties Factors Affecting Communication:  (disorganized thoughts and expression of thoughts)   Cognition Arousal: Alert, Suspect due to medications Behavior During Therapy: Restless, Impulsive Cognition: Cognition impaired, History of cognitive impairments     Awareness: Intellectual awareness intact, Online awareness impaired Memory impairment (select all impairments): Short-term memory, Working memory Attention impairment (select first level of impairment): Focused attention Executive functioning impairment (select all impairments): Organization, Sequencing, Reasoning, Problem solving, Initiation OT - Cognition Comments: Per husband in last 5 weeks there has been psychological problems from grief                 Following commands: Intact       Cueing  General Comments   Cueing Techniques: Verbal cues;Tactile cues;Visual cues  buttocks noted to be red and raw, recommend no mesh underwear or pad, recommend  gerhardts butt cream to RN.   Exercises     Shoulder Instructions      Home Living Family/patient expects to be discharged to:: Private residence Living Arrangements: Spouse/significant other Available Help at Discharge: Available PRN/intermittently;Family (Spouse works from home, and can assist) Type of Home: House Home Access: Level entry     Home Layout: Able to live on main level with bedroom/bathroom;One level;Full bath on main level     Bathroom Shower/Tub: Tub/shower unit;Walk-in shower Raylene Everts)   Bathroom Toilet: Standard Bathroom Accessibility: Yes How Accessible: Accessible via walker Home Equipment: Agricultural consultant (2 wheels);Wheelchair - manual;Shower seat;Grab bars - tub/shower;Hand held shower head (bed rail attachment to regular bed,)   Additional Comments: pt's spouse has been assisting for ~5 weeks with pt progressively needing greater assistance.      Prior Functioning/Environment Prior Level of Function : Needs assist             Mobility Comments: pt has been requiring increased assist, no device. ADLs Comments: Per husband last 5 weeks pt was experiencing a decline both physically and cog. Before then she was independent w/ ADLs    OT Problem List: Decreased strength;Decreased range of motion;Decreased activity tolerance;Impaired balance (sitting and/or standing);Decreased cognition;Decreased safety awareness;Other (comment) (Decreased mental status)   OT Treatment/Interventions: Self-care/ADL training;Therapeutic exercise;DME and/or AE instruction;Therapeutic activities;Patient/family education;Balance training      OT Goals(Current goals can be found in the care plan section)   Acute Rehab OT Goals Patient Stated Goal: None stated OT Goal Formulation: Patient unable to participate in goal setting Time For Goal Achievement: 03/12/24 Potential to Achieve Goals: Good   OT Frequency:  Min 2X/week    Co-evaluation PT/OT/SLP  Co-Evaluation/Treatment: Yes Reason for Co-Treatment: Complexity of the patient's  impairments (multi-system involvement);For patient/therapist safety;To address functional/ADL transfers;Necessary to address cognition/behavior during functional activity   OT goals addressed during session: ADL's and self-care      AM-PAC OT "6 Clicks" Daily Activity     Outcome Measure Help from another person eating meals?: A Little Help from another person taking care of personal grooming?: A Little Help from another person toileting, which includes using toliet, bedpan, or urinal?: Total Help from another person bathing (including washing, rinsing, drying)?: A Lot Help from another person to put on and taking off regular upper body clothing?: A Little Help from another person to put on and taking off regular lower body clothing?: A Lot 6 Click Score: 14   End of Session Equipment Utilized During Treatment: Gait belt;Rolling walker (2 wheels) Nurse Communication: Mobility status  Activity Tolerance: Treatment limited secondary to agitation (Pt becoming restless) Patient left: in bed;with call bell/phone within reach;with bed alarm set;with nursing/sitter in room;with restraints reapplied  OT Visit Diagnosis: Unsteadiness on feet (R26.81);Other abnormalities of gait and mobility (R26.89);Muscle weakness (generalized) (M62.81);Other symptoms and signs involving cognitive function                Time: 1610-9604 OT Time Calculation (min): 32 min Charges:  OT General Charges $OT Visit: 1 Visit OT Evaluation $OT Eval Moderate Complexity: 1 Mod  Ivor Messier, OT  Acute Rehabilitation Services Office [phone] Secure chat preferred   Marilynne Drivers 02/27/2024, 12:59 PM

## 2024-02-27 NOTE — Plan of Care (Signed)
 Patient refused to keep telemetry monitor for shift. Patient educated , needs reinforcement.  Was able to be redirected when agitated and/or restless.    Problem: Pain Managment: Goal: General experience of comfort will improve and/or be controlled Outcome: Progressing   Problem: Safety: Goal: Non-violent Restraint(s) Outcome: Progressing

## 2024-02-28 DIAGNOSIS — F32A Depression, unspecified: Secondary | ICD-10-CM | POA: Diagnosis not present

## 2024-02-28 DIAGNOSIS — E039 Hypothyroidism, unspecified: Secondary | ICD-10-CM | POA: Diagnosis not present

## 2024-02-28 DIAGNOSIS — F332 Major depressive disorder, recurrent severe without psychotic features: Secondary | ICD-10-CM

## 2024-02-28 DIAGNOSIS — F909 Attention-deficit hyperactivity disorder, unspecified type: Secondary | ICD-10-CM | POA: Diagnosis not present

## 2024-02-28 DIAGNOSIS — T50902D Poisoning by unspecified drugs, medicaments and biological substances, intentional self-harm, subsequent encounter: Secondary | ICD-10-CM | POA: Diagnosis not present

## 2024-02-28 LAB — CULTURE, BLOOD (ROUTINE X 2): Culture: NO GROWTH

## 2024-02-28 LAB — RENAL FUNCTION PANEL
Albumin: 2.8 g/dL — ABNORMAL LOW (ref 3.5–5.0)
Anion gap: 9 (ref 5–15)
BUN: 5 mg/dL — ABNORMAL LOW (ref 8–23)
CO2: 19 mmol/L — ABNORMAL LOW (ref 22–32)
Calcium: 8.6 mg/dL — ABNORMAL LOW (ref 8.9–10.3)
Chloride: 114 mmol/L — ABNORMAL HIGH (ref 98–111)
Creatinine, Ser: 1.02 mg/dL — ABNORMAL HIGH (ref 0.44–1.00)
GFR, Estimated: >60 mL/min (ref >60)
Glucose, Bld: 106 mg/dL — ABNORMAL HIGH (ref 70–99)
Phosphorus: 2.2 mg/dL — ABNORMAL LOW (ref 2.5–4.6)
Potassium: 3.6 mmol/L (ref 3.5–5.1)
Sodium: 142 mmol/L (ref 135–145)

## 2024-02-28 LAB — CBC
HCT: 30.8 % — ABNORMAL LOW (ref 36.0–46.0)
Hemoglobin: 10.2 g/dL — ABNORMAL LOW (ref 12.0–15.0)
MCH: 31.9 pg (ref 26.0–34.0)
MCHC: 33.1 g/dL (ref 30.0–36.0)
MCV: 96.3 fL (ref 80.0–100.0)
Platelets: 205 10*3/uL (ref 150–400)
RBC: 3.2 MIL/uL — ABNORMAL LOW (ref 3.87–5.11)
RDW: 12.5 % (ref 11.5–15.5)
WBC: 7.3 10*3/uL (ref 4.0–10.5)
nRBC: 0 % (ref 0.0–0.2)

## 2024-02-28 LAB — MAGNESIUM: Magnesium: 1.8 mg/dL (ref 1.7–2.4)

## 2024-02-28 MED ORDER — K PHOS MONO-SOD PHOS DI & MONO 155-852-130 MG PO TABS
250.0000 mg | ORAL_TABLET | Freq: Two times a day (BID) | ORAL | Status: AC
  Administered 2024-02-28 – 2024-03-01 (×6): 250 mg via ORAL
  Filled 2024-02-28 (×6): qty 1

## 2024-02-28 MED ORDER — LORAZEPAM 2 MG/ML IJ SOLN
0.5000 mg | Freq: Four times a day (QID) | INTRAMUSCULAR | Status: DC | PRN
  Administered 2024-02-29: 0.5 mg via INTRAVENOUS
  Filled 2024-02-28: qty 1

## 2024-02-28 MED ORDER — POTASSIUM CHLORIDE CRYS ER 10 MEQ PO TBCR
40.0000 meq | EXTENDED_RELEASE_TABLET | Freq: Once | ORAL | Status: AC
  Administered 2024-02-28: 40 meq via ORAL
  Filled 2024-02-28: qty 4

## 2024-02-28 MED ORDER — ENOXAPARIN SODIUM 40 MG/0.4ML IJ SOSY
40.0000 mg | PREFILLED_SYRINGE | INTRAMUSCULAR | Status: DC
  Administered 2024-02-28 – 2024-03-09 (×11): 40 mg via SUBCUTANEOUS
  Filled 2024-02-28 (×11): qty 0.4

## 2024-02-28 NOTE — Progress Notes (Addendum)
 PROGRESS NOTE    Sharon Huffman  GNF:[id number] DOB: 23-Feb-1961 DOA: 02/23/2024 PCP: Merri Brunette, MD    Chief Complaint  Patient presents with   Drug Overdose    Brief Narrative:  Patient is a 63 year old female past medical history significant for anxiety, depression, hyperlipidemia, hypertension, hypothyroidism, Lyme's disease, migraine headache and ADHD.  Patient was found down at home (unresponsive) by her husband.  Patient had agonal breathing, with O2 sat of 70% on nonrebreather.  Patient was administered IM Narcan 2 Mg and Glasgow Coma Scale improved from 4-13.  O2 sat improved to 90% on nonrebreather.  Patient lost her mother recently.  Patient is known to have access to morphine, Xanax and Norco.  There are concerns that the patient may have taken the medications.  Psychiatric team has been consulted.   02/24/2024: Patient seen alongside patient's husband.  Patient is awake and alert, patient remains drowsy.  Patient is not able to give significant history.  Patient vomiting reported.  Chest x-ray concerning for possible aspiration/multifocal pneumonia.  Patient is currently on IV Unasyn.   02/25/2024: Patient is awake, but remains drowsy.  Not able to give coherent history.   Assessment & Plan:   Principal Problem:   Overdose Active Problems:   Depression   Hypophosphatemia   Hyperkalemia   Hypomagnesemia   ADHD   Aspiration pneumonia (HCC)   Nausea and vomiting   Dehydration   AKI (acute kidney injury) (HCC)   Hyperlipidemia   Hypothyroidism  #1 drug overdose -Likely intentional intentional. -Patient recently noted to have lost her mother who died in her arms per patient and since then patient has felt depressed and etiology of why she took medications however cannot quite tell me what specific medications and how much she took. -Patient currently denying any suicidal ideation or homicidal ideation at this time. -Drug overdose complicated by aspiration and patient on  IV Unasyn and doxycycline. -Continue one-to-one sitter. -Continue restraints for now as patient noted to be agitated this morning with auditory hallucinations and yelling. -Status post IVC. -Psychiatry consulted.  2.  Probable aspiration pneumonia -Continue IV Unasyn and doxycycline. -SLP following.   -Was tolerating a full liquid diet and diet advanced to dysphagia 2 diet per SLP.   3.  AKI -Secondary to prerenal azotemia in the setting of spironolactone and Lasix as needed. -Patient clinically dry on examination. -Urinalysis nitrite negative, leukocytes negative, negative for protein, 0-5 WBCs, -Renal function improved with hydration.   -Saline lock IV fluids.  4.  Dehydration -IV fluids.  5.  Hyperlipidemia -Statin.    6.  Mild hyperkalemia -Likely secondary to AKI. -Resolved.  7.  Hypophosphatemia/hypomagnesemia -Phosphorus at 2.2.   -Magnesium at 1.8.   -K-Phos 250 mg twice daily x 3 days.  8.  Hypertension -BP currently stable. -Continue to hold antihypertensive medications.  9.  ADHD/depression/anxiety/agitation -Patient noted to be hallucinating per RN and NT today. -Patient also with bouts of agitation and yelling. -Patient started on Ativan 0.5 mg 3 times daily per psychiatry to avoid withdrawal as patient noted to be on Xanax as needed prior to admission. -Place on IV Ativan 0.5 mg IV every 6 hours as needed agitation. -Continue Seroquel nightly. -Continue to hold home regimen pending psychiatric evaluation.  10.  Nausea and vomiting -Improved clinically.   -Status post Dulcolax suppository x 1.   -Laxatives changed to as needed as patient was starting to have diarrhea. -Supportive care.  11.  Migraine headaches -Stable.  12.  Hypothyroidism -  Synthroid  13.  Hypokalemia -Secondary to GI losses as patient noted to have multiple watery loose stools per RN and sitter on 02/27/2024.   -Laxatives changed to as needed.   -Potassium repleted and  currently at 3.6 this morning.   -Repeat labs in the AM.  14.  Diarrhea -Patient with multiple watery loose stools on laxatives. -Diarrhea improved. -Laxatives discontinued and changed to as needed.     DVT prophylaxis: Heparin>>> Lovenox Code Status: Full Family Communication: No family at bedside. Disposition: TBD  Status is: Inpatient Remains inpatient appropriate because: Severity of illness   Consultants:  Psychiatry pending  Procedures:  CT head 02/23/2024 Chest x-ray 02/23/2024, 02/24/2024 MRI L-spine 02/24/2024 MRI brain 02/24/2024  Antimicrobials:  Anti-infectives (From admission, onward)    Start     Dose/Rate Route Frequency Ordered Stop   02/25/24 2200  doxycycline (VIBRA-TABS) tablet 100 mg        100 mg Oral Every 12 hours 02/25/24 1042     02/25/24 1800  Ampicillin-Sulbactam (UNASYN) 3 g in sodium chloride 0.9 % 100 mL IVPB        3 g 200 mL/hr over 30 Minutes Intravenous Every 8 hours 02/25/24 1539     02/24/24 1000  Ampicillin-Sulbactam (UNASYN) 3 g in sodium chloride 0.9 % 100 mL IVPB  Status:  Discontinued        3 g 200 mL/hr over 30 Minutes Intravenous Every 12 hours 02/24/24 0127 02/25/24 1539   02/23/24 2000  cefTRIAXone (ROCEPHIN) 2 g in sodium chloride 0.9 % 100 mL IVPB        2 g 200 mL/hr over 30 Minutes Intravenous Once 02/23/24 1947 02/23/24 2059   02/23/24 2000  doxycycline (VIBRAMYCIN) 100 mg in sodium chloride 0.9 % 250 mL IVPB  Status:  Discontinued        100 mg 125 mL/hr over 120 Minutes Intravenous Every 12 hours 02/23/24 1947 02/25/24 1042         Subjective: Patient sitting up in bed in restraints.  Alert to self.  Thinks he is in apartment complex.  However knows she is in [place], [place], knows it is 2025 however unsure of the month.  States Trump is the president.   Patient noted to be agitated early on this morning and had to be placed back in restraints.  Patient with auditory hallucinations per nurse tech at bedside.   Patient denies any suicidal or homicidal ideations.  Asking whether she can go home so she can go take care of her pets.    Objective: Vitals:   02/27/24 2040 02/28/24 0353 02/28/24 0737 02/28/24 0738  BP: 124/71 (!) 109/98 (!) 147/81 (!) 147/81  Pulse: 96 100 (!) 102 (!) 102  Resp: 16 18 16 16   Temp: 98.3 F (36.8 C) 98.6 F (37 C)  98.3 F (36.8 C)  TempSrc: Oral Axillary  Oral  SpO2: 98% 100% 100% 100%  Weight:      Height:        Intake/Output Summary (Last 24 hours) at 02/28/2024 1226 Last data filed at 02/28/2024 0834 Gross per 24 hour  Intake 490 ml  Output --  Net 490 ml   Filed Weights   02/23/24 1927  Weight: 65.3 kg    Examination:  General exam: NAD.Marland Kitchen  Dry mucous membranes. Respiratory system: CTAB anterior lung fields.  No wheezes, no crackles, no rhonchi.  Fair air movement.  Speaking in full sentences.  Cardiovascular system: Regular rate rhythm no murmurs rubs  or gallops.  No JVD.  No lower extremity edema.  Gastrointestinal system: Abdomen is soft, nontender, nondistended, positive bowel sounds.  No rebound.  No guarding.  Central nervous system: Alert and oriented.  Moving extremities spontaneously.  No focal neurological deficits. Extremities: Symmetric 5 x 5 power. Skin: No rashes, lesions or ulcers Psychiatry: Judgement and insight appear poor to fair. Mood & affect depressed.     Data Reviewed: I have personally reviewed following labs and imaging studies  CBC: Recent Labs  Lab 02/23/24 1922 02/23/24 1952 02/24/24 0129 02/25/24 0640 02/26/24 0542 02/27/24 0339 02/28/24 0324  WBC 11.3*  --  14.8* 10.0 11.4* 8.4 7.3  NEUTROABS 10.1*  --   --  8.8* 10.5*  --   --   HGB 13.2   < > 12.3 10.5* 11.9* 10.5* 10.2*  HCT 41.9   < > 38.4 30.7* 35.7* 30.7* 30.8*  MCV 100.7*  --  99.5 94.8 96.0 93.9 96.3  PLT 246  --  203 178 201 201 205   < > = values in this interval not displayed.    Basic Metabolic Panel: Recent Labs  Lab 02/25/24 0640  02/26/24 0542 02/27/24 0339 02/27/24 2004 02/28/24 0324  NA 141 140 139 143 142  K 3.7 3.5 2.8* 3.5 3.6  CL 107 104 109 114* 114*  CO2 24 20* 19* 20* 19*  GLUCOSE 94 91 92 94 106*  BUN 15 11 9  6* 5*  CREATININE 1.18* 1.21* 0.91 1.00 1.02*  CALCIUM 9.2 9.6 8.4* 8.5* 8.6*  MG 1.4* 1.8 1.9  --  1.8  PHOS 1.7* 2.6 2.9  --  2.2*    GFR: Estimated Creatinine Clearance: 50.1 mL/min (A) (by C-G formula based on SCr of 1.02 mg/dL (H)).  Liver Function Tests: Recent Labs  Lab 02/23/24 2130 02/25/24 0640 02/26/24 0542 02/27/24 0339 02/28/24 0324  AST 27  --   --   --   --   ALT 18  --   --   --   --   ALKPHOS 56  --   --   --   --   BILITOT 1.0  --   --   --   --   PROT 5.5*  --   --   --   --   ALBUMIN 3.1* 3.0* 3.5 2.8* 2.8*    CBG: No results for input(s): "GLUCAP" in the last 168 hours.   Recent Results (from the past 240 hours)  Resp panel by RT-PCR (RSV, Flu A&B, Covid) Anterior Nasal Swab     Status: None   Collection Time: 02/23/24  8:10 PM   Specimen: Anterior Nasal Swab  Result Value Ref Range Status   SARS Coronavirus 2 by RT PCR NEGATIVE NEGATIVE Final   Influenza A by PCR NEGATIVE NEGATIVE Final   Influenza B by PCR NEGATIVE NEGATIVE Final    Comment: (NOTE) The Xpert Xpress SARS-CoV-2/FLU/RSV plus assay is intended as an aid in the diagnosis of influenza from Nasopharyngeal swab specimens and should not be used as a sole basis for treatment. Nasal washings and aspirates are unacceptable for Xpert Xpress SARS-CoV-2/FLU/RSV testing.  Fact Sheet for Patients: BloggerCourse.com  Fact Sheet for Healthcare Providers: SeriousBroker.it  This test is not yet approved or cleared by the [place] FDA and has been authorized for detection and/or diagnosis of SARS-CoV-2 by FDA under an Emergency Use Authorization (EUA). This EUA will remain in effect (meaning this test can be used) for the duration of  the COVID-19 declaration under Section 564(b)(1) of the Act, 21 U.S.C. section 360bbb-3(b)(1), unless the authorization is terminated or revoked.     Resp Syncytial Virus by PCR NEGATIVE NEGATIVE Final    Comment: (NOTE) Fact Sheet for Patients: BloggerCourse.com  Fact Sheet for Healthcare Providers: SeriousBroker.it  This test is not yet approved or cleared by the [place] FDA and has been authorized for detection and/or diagnosis of SARS-CoV-2 by FDA under an Emergency Use Authorization (EUA). This EUA will remain in effect (meaning this test can be used) for the duration of the COVID-19 declaration under Section 564(b)(1) of the Act, 21 U.S.C. section 360bbb-3(b)(1), unless the authorization is terminated or revoked.  Performed at [facility], [address] [place], [place] [address]   Blood Culture (routine x 2)     Status: None   Collection Time: 02/23/24  8:10 PM   Specimen: BLOOD  Result Value Ref Range Status   Specimen Description BLOOD RIGHT ANTECUBITAL  Final   Special Requests   Final    BOTTLES DRAWN AEROBIC AND ANAEROBIC Blood Culture results may not be optimal due to an inadequate volume of blood received in culture bottles   Culture   Final    NO GROWTH 5 DAYS Performed at [facility], [address], [place], [place] [address]    Report Status 02/28/2024 FINAL  Final         Radiology Studies: DG Abd Portable 1V Result Date: 02/26/2024 CLINICAL DATA:  Nausea and vomiting. EXAM: PORTABLE ABDOMEN - 1 VIEW COMPARISON:  None Available. FINDINGS: Air scattered throughout nondilated small and large bowel. No evidence of obstruction. Minimal formed stool in the colon. No visible radiopaque calculi or abnormal soft tissue calcifications. IMPRESSION: Nonobstructive bowel gas pattern. Electronically Signed   By: Narda Rutherford M.D.   On: 02/26/2024 22:50        Scheduled Meds:  aspirin  EC  81 mg Oral Daily   bisacodyl  10 mg Rectal Once   doxycycline  100 mg Oral Q12H   enoxaparin (LOVENOX) injection  30 mg Subcutaneous Q24H   famotidine  20 mg Oral QHS   gabapentin  100 mg Oral BID   levothyroxine  50 mcg Oral QAC breakfast   LORazepam  0.5 mg Oral TID   multivitamin with minerals  1 tablet Oral Q24H   pantoprazole  40 mg Oral Daily   phosphorus  250 mg Oral BID   QUEtiapine  25 mg Oral QHS   rosuvastatin  10 mg Oral Daily   sodium chloride flush  10-40 mL Intracatheter Q12H   thiamine  100 mg Oral Daily   Continuous Infusions:  ampicillin-sulbactam (UNASYN) IV 3 g (02/28/24 0819)     LOS: 4 days    Time spent: 35 minutes    Ramiro Harvest, MD [facility]   To contact the attending provider between 7A-7P or the covering provider during after hours 7P-7A, please log into the web site [email or web address] and access using universal [place] password for that web site. If you do not have the password, please call the hospital operator.  02/28/2024, 12:26 PM

## 2024-02-28 NOTE — Progress Notes (Signed)
 Speech Language Pathology Treatment: Dysphagia  Patient Details Name: Sharon Huffman MRN: [id number] DOB: 1961-04-12 Today's Date: 02/28/2024 Time: 4098-1191 SLP Time Calculation (min) (ACUTE ONLY): 18 min  Assessment / Plan / Recommendation Clinical Impression  Pt remains confused but is much more alert and conversant this morning. Per chart, no further N/V reported. Upon SLP arrival RN was administering medications, which pt was pocketing and then proceeded to chew. SLP offered applesauce to clear the broken tablet. Coughing was noted after swallowing this, but was not observed again across all other POs offered. She has no other overt signs concerning for a pharyngeal dysphagia but signs of a cognitively-based oral dysphagia persist. She needs cues to remain attentive to POs, with prolonged mastication and mild oral residue observed when given a softened cracker. Recommend that she advance to Dys 2 (finely chopped) diet with thin liquids. Hopeful for further progression as mentation improves.   HPI HPI: Pt is a 63 y.o. female admitted 02/23/24 via EMS for possible drug overdose. MRI negative. CXR concerning for PNA. PMH: anxiety, bronchitis, cervical cancer, depression, hyperlipidemia, HTN, lyme disease, migraine, ovarian cancer      SLP Plan  Continue with current plan of care      Recommendations for follow up therapy are one component of a multi-disciplinary discharge planning process, led by the attending physician.  Recommendations may be updated based on patient status, additional functional criteria and insurance authorization.    Recommendations  Diet recommendations: Dysphagia 2 (fine chop);Thin liquid Liquids provided via: Cup;Straw Medication Administration: Whole meds with puree (crush PRN) Supervision: Staff to assist with self feeding;Full supervision/cueing for compensatory strategies Compensations: Minimize environmental distractions;Slow rate;Small sips/bites Postural  Changes and/or Swallow Maneuvers: Seated upright 90 degrees;Upright 30-60 min after meal                  Oral care BID     Dysphagia, unspecified (R13.10)     Continue with current plan of care     Sharon Huffman., M.A. CCC-SLP Acute Rehabilitation Services Office [phone]  Secure chat preferred   02/28/2024, 10:06 AM

## 2024-02-28 NOTE — Consult Note (Signed)
 [facility] Psychiatric Consult Initial  Patient Name: .Sharon Huffman  MRN: [id number]  DOB: [birth year]  Consult Order details:  Orders (From admission, onward)     Start     Ordered   02/24/24 1553  IP CONSULT TO PSYCHIATRY       Ordering Provider: Barnetta Chapel, MD  Provider:  (Not yet assigned)  Question Answer Comment  Location [facility]   Reason for Consult? intentional drug overdose      02/24/24 1552             Mode of Visit: In person    Psychiatry Consult Evaluation  Service Date: February 28, 2024 LOS:  LOS: 4 days  Chief Complaint "I just need to get over saying that.  I feel pretty good.  I'm assuming I tried to kill myself because I ended up in the hospital for five months," and then confusion.  Primary Psychiatric Diagnoses  Major depressive disorder, recurrent, severe  2.  Delirium 3.  General anxiety d/o  Assessment  Sharon Huffman is a 63 y.o. female admitted: Medicallyfor 02/23/2024  7:22 PM for intentional overdose. She carries the psychiatric diagnoses of MDD and GAD and has a past medical history of asthma, hypothyroidism, AKI, chronic fatigue., and hyperlipidemia   Her current presentation of intermittent confusion with agitation is most consistent with delirium. She meets criteria for MDD based on grief issues, difficulty coping, and suicide attempt.  Current outpatient psychotropic medications include Xanax, Prozac, and Adderall and historically she has had a low response to these medications. She was compliant with medications prior to admission as evidenced by husband's report except she was overtaking them. On initial examination, patient had a brief moment of clarity but mostly confused. Please see plan below for detailed recommendations.   Diagnoses:  Active Hospital problems: Principal Problem:   Overdose Active Problems:   Depression   Hypophosphatemia   Hyperkalemia   Hypomagnesemia   ADHD   Aspiration pneumonia (HCC)    Nausea and vomiting   Dehydration   AKI (acute kidney injury) (HCC)   Hyperlipidemia   Hypothyroidism    Plan   ## Psychiatric Medication Recommendations:  Do not recommend Ativan 0.5 mg TID and IV PRN  Continue gabapentin 100 mg BID with potential to increase this Continue Seroquel 25 mg at bedtime Replaced home medication of Prozac that was not started due to her overdose on unknown medications 4 days ago with Lexapro 5 mg daily  ## Medical Decision Making Capacity: Not specifically addressed in this encounter  ## Further Work-up:  -- most recent EKG on 02/23/2024 had QtC of 458 -- Pertinent labwork reviewed earlier this admission includes: CMP, CBC with diff, UDS, and U/A   ## Disposition:-- We recommend inpatient psychiatric hospitalization when medically cleared. Patient is under voluntary admission status at this time; please IVC if attempts to leave hospital.  ## Behavioral / Environmental: -Delirium Precautions: Delirium Interventions for Nursing and Staff: - RN to open blinds every AM. - To Bedside: Glasses, hearing aide, and pt's own shoes. Make available to patients. when possible and encourage use. - Encourage po fluids when appropriate, keep fluids within reach. - OOB to chair with meals. - Passive ROM exercises to all extremities with AM & PM care. - RN to assess orientation to person, time and place QAM and PRN. - Recommend extended visitation hours with familiar family/friends as feasible. - Staff to minimize disturbances at night. Turn off television when pt asleep  or when not in use.    ## Safety and Observation Level:  - Based on my clinical evaluation, I estimate the patient to be at moderate risk of self harm in the current setting. - At this time, we recommend  routine. This decision is based on my review of the chart including patient's history and current presentation, interview of the patient, mental status examination, and consideration of suicide risk including  evaluating suicidal ideation, plan, intent, suicidal or self-harm behaviors, risk factors, and protective factors. This judgment is based on our ability to directly address suicide risk, implement suicide prevention strategies, and develop a safety plan while the patient is in the clinical setting. Please contact our team if there is a concern that risk level has changed.  CSSR Risk Category:   Suicide Risk Assessment: Patient has following modifiable risk factors for suicide: under treated depression , which we are addressing by adjusting medications. Patient has following non-modifiable or demographic risk factors for suicide: history of suicide attempt Patient has the following protective factors against suicide: Access to outpatient mental health care and Supportive family  Thank you for this consult request. Recommendations have been communicated to the primary team.  We will continue to follow at this time.   Nanine Means, NP       History of Present Illness  Relevant Aspects of [facility] Course:  Admitted on 02/23/2024 for intentional overdose. They are delirious with some improvement today.   Patient Report:  "I just need to get over saying that.  I feel pretty good.  I'm assuming I tried to kill myself because I ended up in the hospital for five months," and then confusing statements with rambling.  The sitter at her bedside reported that she thought she was her daughter and continually talking to people who are there.  Agitated at times, restraints in place.  Psych ROS:  Depression: present Anxiety:  high Mania (lifetime and current): UTA Psychosis: (lifetime and current): hallucinating  Collateral information:  Her husband at her bedside reported she was "more subdued" and less agitated. "She kinda has some data at times, facts are buried with the confusion". He does feel she is doing better physically without so many medications , especially with opiates. Contributes the  client to downward spiraling the death of her mother 5 weeks ago that she was caring for. This is when she started over taking her medications.   Review of Systems  Neurological:  Positive for sensory change.  Psychiatric/Behavioral:  Positive for hallucinations and memory loss. The patient is nervous/anxious.      Psychiatric and Social History  Psychiatric History:  Information collected from patient, chart, sitter, and husband (3/12)  Prev Dx/Sx: MDD, grief, GAD, ADHD Current Psych Provider: [facility] Meds (current): Xanax, Prozac, Adderall Previous Med Trials: unknown Therapy: UTA  Prior Psych Hospitalization: none  Prior Self Harm: UTA Prior Violence: UTA  Family Psych History: UTA Family Hx suicide: UTA  Social History:  Living Situation: lives with her husband  Access to weapons/lethal means: none   Substance History UTA Her husband reported she was overtaking her medication at home   Exam Findings  Physical Exam:  Vital Signs:  Temp:  [98.3 F (36.8 C)-98.6 F (37 C)] 98.3 F (36.8 C) (03/14 0738) Pulse Rate:  [96-102] 102 (03/14 0738) Resp:  [16-18] 16 (03/14 0738) BP: (109-147)/(71-98) 147/81 (03/14 0738) SpO2:  [98 %-100 %] 100 % (03/14 0738) Blood pressure (!) 147/81, pulse (!) 102, temperature  98.3 F (36.8 C), temperature source Oral, resp. rate 16, height 5\' 5"  (1.651 m), weight 65.3 kg, SpO2 100%. Body mass index is 23.96 kg/m.  Physical Exam Vitals and nursing note reviewed.  HENT:     Head: Normocephalic.     Nose: Nose normal.  Pulmonary:     Effort: Pulmonary effort is normal.  Musculoskeletal:     Cervical back: Normal range of motion.  Neurological:     Mental Status: She is alert.     Mental Status Exam: General Appearance: Casual  Orientation:  person only  Memory:  Immediate;   Poor Recent;   Poor Remote;   Poor  Concentration:  Concentration: Poor and Attention Span: Poor  Recall:  Poor  Attention  Poor  Eye  Contact:  Fair  Speech:  Slurred at times  Language:  Fair  Volume:  Normal  Mood: anxious  Affect:  Blunt  Thought Process:  Irrelevant  Thought Content:  Hallucinations: Visual  Suicidal Thoughts:  No  Homicidal Thoughts:  No  Judgement:  Impaired  Insight:  Lacking  Psychomotor Activity:  Increased  Akathisia:  No  Fund of Knowledge:  Fair      Assets:  Housing Leisure Time Resilience Social Support  Cognition:  Impaired,  Moderate  ADL's:  Impaired  AIMS (if indicated):        Other History   These have been pulled in through the EMR, reviewed, and updated if appropriate.  Family History:  The patient's family history includes Dementia in her mother; Heart attack in her father; Heart disease in her father; Hyperlipidemia in her mother; Hypertension in her mother.  Medical History: Past Medical History:  Diagnosis Date   ADHD    Anxiety    Bronchitis, chronic (HCC)    Cervical cancer (HCC)    Depression    Depression    Hyperlipidemia    Hypertension    Insomnia disorder    Lyme disease    Migraine    Ovarian cancer (HCC)    Palpitations     Surgical History: Past Surgical History:  Procedure Laterality Date   ABDOMINAL HYSTERECTOMY  1994   ANTERIOR CERVICAL DECOMP/DISCECTOMY FUSION  2000   AUGMENTATION MAMMAPLASTY Bilateral 1998   BREAST ENHANCEMENT SURGERY Bilateral    EYE SURGERY  2016   PVD     Medications:   Current Facility-Administered Medications:    Ampicillin-Sulbactam (UNASYN) 3 g in sodium chloride 0.9 % 100 mL IVPB, 3 g, Intravenous, Q8H, Leander Rams, RPH, Last Rate: 200 mL/hr at 02/28/24 0819, 3 g at 02/28/24 0819   aspirin EC tablet 81 mg, 81 mg, Oral, Daily, Skip Mayer A, MD, 81 mg at 02/28/24 0817   bisacodyl (DULCOLAX) suppository 10 mg, 10 mg, Rectal, Once, Rodolph Bong, MD   doxycycline (VIBRA-TABS) tablet 100 mg, 100 mg, Oral, Q12H, Leander Rams, RPH, 100 mg at 02/28/24 0817   enoxaparin (LOVENOX) injection 40  mg, 40 mg, Subcutaneous, Q24H, Rodolph Bong, MD   famotidine (PEPCID) tablet 20 mg, 20 mg, Oral, QHS, Rodolph Bong, MD, 20 mg at 02/27/24 2045   gabapentin (NEURONTIN) capsule 100 mg, 100 mg, Oral, BID, Charm Rings, NP, 100 mg at 02/28/24 1610   Gerhardt's butt cream, , Topical, PRN, Rodolph Bong, MD, Given at 02/27/24 2057   levothyroxine (SYNTHROID) tablet 50 mcg, 50 mcg, Oral, QAC breakfast, Rodolph Bong, MD, 50 mcg at 02/28/24 0621   LORazepam (ATIVAN) injection 0.5 mg,  0.5 mg, Intravenous, Q6H PRN, Rodolph Bong, MD   LORazepam (ATIVAN) tablet 0.5 mg, 0.5 mg, Oral, TID, Charm Rings, NP, 0.5 mg at 02/28/24 3474   multivitamin with minerals tablet 1 tablet, 1 tablet, Oral, Q24H, Charm Rings, NP, 1 tablet at 02/27/24 1231   pantoprazole (PROTONIX) EC tablet 40 mg, 40 mg, Oral, Daily, Rodolph Bong, MD, 40 mg at 02/28/24 2595   phosphorus (K PHOS NEUTRAL) tablet 250 mg, 250 mg, Oral, BID, Rodolph Bong, MD, 250 mg at 02/28/24 6387   polyethylene glycol (MIRALAX / GLYCOLAX) packet 17 g, 17 g, Oral, Daily PRN, Rodolph Bong, MD   QUEtiapine (SEROQUEL) tablet 25 mg, 25 mg, Oral, QHS, Ermina Oberman Y, NP, 25 mg at 02/27/24 2045   rosuvastatin (CRESTOR) tablet 10 mg, 10 mg, Oral, Daily, Rodolph Bong, MD, 10 mg at 02/28/24 5643   senna-docusate (Senokot-S) tablet 1 tablet, 1 tablet, Oral, QHS PRN, Rodolph Bong, MD   sodium chloride flush (NS) 0.9 % injection 10-40 mL, 10-40 mL, Intracatheter, Q12H, Rodolph Bong, MD, 10 mL at 02/28/24 0819   sodium chloride flush (NS) 0.9 % injection 10-40 mL, 10-40 mL, Intracatheter, PRN, Rodolph Bong, MD   thiamine (VITAMIN B1) tablet 100 mg, 100 mg, Oral, Daily, Charm Rings, NP, 100 mg at 02/28/24 3295  Allergies: Allergies  Allergen Reactions   Amlodipine Swelling and Other (See Comments)    Leg edema and fluid retention   Atorvastatin Other (See Comments)    Other reaction(s):  back ache   Bystolic [Nebivolol Hcl] Other (See Comments)    sweat and get clammy   Carbamazepine Other (See Comments)    rash   Mirtazapine Other (See Comments)    Other reaction(s): edema, vivid nightmares, patient was unsure    Penicillins Nausea And Vomiting   Sulfacetamide Nausea Only    Nanine Means, NP

## 2024-02-29 ENCOUNTER — Other Ambulatory Visit: Payer: Self-pay | Admitting: Cardiology

## 2024-02-29 DIAGNOSIS — T50902D Poisoning by unspecified drugs, medicaments and biological substances, intentional self-harm, subsequent encounter: Secondary | ICD-10-CM | POA: Diagnosis not present

## 2024-02-29 DIAGNOSIS — F32A Depression, unspecified: Secondary | ICD-10-CM | POA: Diagnosis not present

## 2024-02-29 DIAGNOSIS — F909 Attention-deficit hyperactivity disorder, unspecified type: Secondary | ICD-10-CM | POA: Diagnosis not present

## 2024-02-29 DIAGNOSIS — I10 Essential (primary) hypertension: Secondary | ICD-10-CM

## 2024-02-29 DIAGNOSIS — E039 Hypothyroidism, unspecified: Secondary | ICD-10-CM | POA: Diagnosis not present

## 2024-02-29 DIAGNOSIS — R55 Syncope and collapse: Secondary | ICD-10-CM

## 2024-02-29 DIAGNOSIS — F332 Major depressive disorder, recurrent severe without psychotic features: Secondary | ICD-10-CM | POA: Diagnosis not present

## 2024-02-29 DIAGNOSIS — R002 Palpitations: Secondary | ICD-10-CM

## 2024-02-29 LAB — CBC
HCT: 31.3 % — ABNORMAL LOW (ref 36.0–46.0)
Hemoglobin: 10.3 g/dL — ABNORMAL LOW (ref 12.0–15.0)
MCH: 31.7 pg (ref 26.0–34.0)
MCHC: 32.9 g/dL (ref 30.0–36.0)
MCV: 96.3 fL (ref 80.0–100.0)
Platelets: 228 10*3/uL (ref 150–400)
RBC: 3.25 MIL/uL — ABNORMAL LOW (ref 3.87–5.11)
RDW: 12.7 % (ref 11.5–15.5)
WBC: 7 10*3/uL (ref 4.0–10.5)
nRBC: 0 % (ref 0.0–0.2)

## 2024-02-29 LAB — MAGNESIUM: Magnesium: 1.7 mg/dL (ref 1.7–2.4)

## 2024-02-29 LAB — BASIC METABOLIC PANEL
Anion gap: 11 (ref 5–15)
BUN: 7 mg/dL — ABNORMAL LOW (ref 8–23)
CO2: 19 mmol/L — ABNORMAL LOW (ref 22–32)
Calcium: 8.8 mg/dL — ABNORMAL LOW (ref 8.9–10.3)
Chloride: 111 mmol/L (ref 98–111)
Creatinine, Ser: 1.05 mg/dL — ABNORMAL HIGH (ref 0.44–1.00)
GFR, Estimated: 59 mL/min — ABNORMAL LOW (ref >60)
Glucose, Bld: 98 mg/dL (ref 70–99)
Potassium: 3.5 mmol/L (ref 3.5–5.1)
Sodium: 141 mmol/L (ref 135–145)

## 2024-02-29 MED ORDER — MAGNESIUM SULFATE 2 GM/50ML IV SOLN
2.0000 g | Freq: Once | INTRAVENOUS | Status: AC
  Administered 2024-02-29: 2 g via INTRAVENOUS
  Filled 2024-02-29: qty 50

## 2024-02-29 MED ORDER — ESCITALOPRAM OXALATE 10 MG PO TABS
5.0000 mg | ORAL_TABLET | Freq: Every day | ORAL | Status: DC
  Administered 2024-02-29 – 2024-03-07 (×8): 5 mg via ORAL
  Filled 2024-02-29 (×8): qty 1

## 2024-02-29 MED ORDER — OLANZAPINE 2.5 MG PO TABS
2.5000 mg | ORAL_TABLET | Freq: Two times a day (BID) | ORAL | Status: DC
  Administered 2024-02-29 – 2024-03-04 (×9): 2.5 mg via ORAL
  Filled 2024-02-29 (×11): qty 1

## 2024-02-29 MED ORDER — ALPRAZOLAM 0.5 MG PO TABS
0.5000 mg | ORAL_TABLET | Freq: Two times a day (BID) | ORAL | Status: DC
  Administered 2024-02-29 – 2024-03-10 (×21): 0.5 mg via ORAL
  Filled 2024-02-29 (×21): qty 1

## 2024-02-29 NOTE — Plan of Care (Signed)
 Overnight auditory and visual hallucination noted with brief period of naps and alertness majority of the night, intermittent pulling off IV tubings and trying to get out of the bed. 4 point restraints continued. Ativan PRN IV was given based on CIWA score, minimal relief noted. Pt resting on bed mumbling with eyes open. Denies any homicidal or suicidal ideation.  Problem: Nutrition: Goal: Adequate nutrition will be maintained Outcome: Progressing   Problem: Skin Integrity: Goal: Risk for impaired skin integrity will decrease Outcome: Progressing   Problem: Safety: Goal: Non-violent Restraint(s) Outcome: Progressing

## 2024-02-29 NOTE — Consult Note (Signed)
 [facility] Psychiatric Consult Follow up  Patient Name: .[name]  MRN: [id number]  DOB: [birth year]  Consult Order details:  Orders (From admission, onward)     Start     Ordered   02/24/24 1553  IP CONSULT TO PSYCHIATRY       Ordering Provider: Barnetta Chapel, MD  Provider:  (Not yet assigned)  Question Answer Comment  Location [facility]   Reason for Consult? intentional drug overdose      02/24/24 1552             Mode of Visit: In person    Psychiatry Consult Evaluation  Service Date: February 29, 2024 LOS:  LOS: 5 days  Chief Complaint "I just need to get over saying that.  I feel pretty good.  I'm assuming I tried to kill myself because I ended up in the hospital for five months," and then confusion.  Primary Psychiatric Diagnoses  Major depressive disorder, recurrent, severe  2.  Delirium 3.  General anxiety d/o  Assessment  Sharon Huffman is a 63 y.o. female admitted: Medicallyfor 02/23/2024  7:22 PM for intentional overdose. She carries the psychiatric diagnoses of MDD and GAD and has a past medical history of asthma, hypothyroidism, AKI, chronic fatigue., and hyperlipidemia   Her current presentation of intermittent confusion with agitation is most consistent with delirium. She meets criteria for MDD based on grief issues, difficulty coping, and suicide attempt.  Current outpatient psychotropic medications include Xanax, Prozac, and Adderall and historically she has had a low response to these medications. She was compliant with medications prior to admission as evidenced by husband's report except she was overtaking them. On initial examination, patient had a brief moment of clarity but mostly confused. Please see plan below for detailed recommendations.   02/29/2024: Her cognition remains the same with pleasant confusion and restlessness, constantly trying to get out of the bed, restraints in place.  Rambles with irrelevant sentences, consistent  with delirium.  Her sitter reported that she did not sleep well last night.  She did eat her breakfast and had a bath without issues, no aggression.  The client continues to talk to people who are not there.  Consulted with the psychiatrist and medications changed to assist, hopefully.  Diagnoses:  Active Hospital problems: Principal Problem:   Overdose Active Problems:   Depression   Hypophosphatemia   Hyperkalemia   Hypomagnesemia   ADHD   Aspiration pneumonia (HCC)   Nausea and vomiting   Dehydration   AKI (acute kidney injury) (HCC)   Hyperlipidemia   Hypothyroidism   Major depressive disorder, recurrent severe without psychotic features (HCC)    Plan   ## Psychiatric Medication Recommendations:  Discontinued Ativan 0.5 mg TID and IV PRN  Started Xanax 0.5 mg BID per Dr. Enedina Finner as she was taking 1 mg BID prior to admission, unsure if this was one she overdosed on but would be cleared at this time.  She has been on Xanax for a long time making w/d difficult Continue gabapentin 100 mg BID with potential to increase this Discontinue Seroquel 25 mg at bedtime Started Zyprexa 2.5 mg BID Replaced home medication of Prozac that was not started due to her overdose on unknown medications 4 days ago with Lexapro 5 mg daily  ## Medical Decision Making Capacity: Not specifically addressed in this encounter  ## Further Work-up:  -- most recent EKG on 02/23/2024 had QtC of 458 -- Pertinent labwork reviewed earlier  this admission includes: CMP, CBC with diff, UDS, and U/A   ## Disposition:-- We recommend inpatient psychiatric hospitalization when medically cleared. Patient is under voluntary admission status at this time; please IVC if attempts to leave hospital.  ## Behavioral / Environmental: -Delirium Precautions: Delirium Interventions for Nursing and Staff: - RN to open blinds every AM. - To Bedside: Glasses, hearing aide, and pt's own shoes. Make available to patients. when possible  and encourage use. - Encourage po fluids when appropriate, keep fluids within reach. - OOB to chair with meals. - Passive ROM exercises to all extremities with AM & PM care. - RN to assess orientation to person, time and place QAM and PRN. - Recommend extended visitation hours with familiar family/friends as feasible. - Staff to minimize disturbances at night. Turn off television when pt asleep or when not in use.    ## Safety and Observation Level:  - Based on my clinical evaluation, I estimate the patient to be at moderate risk of self harm in the current setting. - At this time, we recommend  routine. This decision is based on my review of the chart including patient's history and current presentation, interview of the patient, mental status examination, and consideration of suicide risk including evaluating suicidal ideation, plan, intent, suicidal or self-harm behaviors, risk factors, and protective factors. This judgment is based on our ability to directly address suicide risk, implement suicide prevention strategies, and develop a safety plan while the patient is in the clinical setting. Please contact our team if there is a concern that risk level has changed.  CSSR Risk Category:   Suicide Risk Assessment: Patient has following modifiable risk factors for suicide: under treated depression , which we are addressing by adjusting medications. Patient has following non-modifiable or demographic risk factors for suicide: history of suicide attempt Patient has the following protective factors against suicide: Access to outpatient mental health care and Supportive family  Thank you for this consult request. Recommendations have been communicated to the primary team.  We will continue to follow at this time.   Nanine Means, NP       History of Present Illness  Relevant Aspects of [facility] Course:  Admitted on 02/23/2024 for intentional overdose. They are delirious with some improvement  today.   Patient Report:  "I just need to get over saying that.  I feel pretty good.  I'm assuming I tried to kill myself because I ended up in the hospital for five months," and then confusing statements with rambling.  The sitter at her bedside reported that she thought she was her daughter and continually talking to people who are there.  Agitated at times, restraints in place.  Psych ROS:  Depression: present Anxiety:  high Mania (lifetime and current): UTA Psychosis: (lifetime and current): hallucinating  Collateral information:  Her husband at her bedside reported she was "more subdued" and less agitated. "She kinda has some data at times, facts are buried with the confusion". He does feel she is doing better physically without so many medications , especially with opiates. Contributes the client to downward spiraling the death of her mother 5 weeks ago that she was caring for. This is when she started over taking her medications.   Review of Systems  Neurological:  Positive for sensory change.  Psychiatric/Behavioral:  Positive for hallucinations and memory loss. The patient is nervous/anxious.      Psychiatric and Social History  Psychiatric History:  Information collected from patient, chart, sitter,  and husband (3/12)  Prev Dx/Sx: MDD, grief, GAD, ADHD Current Psych Provider: [facility] Meds (current): Xanax, Prozac, Adderall Previous Med Trials: unknown Therapy: UTA  Prior Psych Hospitalization: none  Prior Self Harm: UTA Prior Violence: UTA  Family Psych History: UTA Family Hx suicide: UTA  Social History:  Living Situation: lives with her husband  Access to weapons/lethal means: none   Substance History UTA Her husband reported she was overtaking her medication at home   Exam Findings  Physical Exam:  Vital Signs:  Temp:  [97.6 F (36.4 C)-98.4 F (36.9 C)] 97.6 F (36.4 C) (03/15 0822) Pulse Rate:  [100-112] 101 (03/15 0822) Resp:  [17-20] 18  (03/15 0822) BP: (135-147)/(79-86) 135/82 (03/15 0822) SpO2:  [95 %-100 %] 100 % (03/15 0822) Blood pressure 135/82, pulse (!) 101, temperature 97.6 F (36.4 C), resp. rate 18, height 5\' 5"  (1.651 m), weight 65.3 kg, SpO2 100%. Body mass index is 23.96 kg/m.  Physical Exam Vitals and nursing note reviewed.  HENT:     Head: Normocephalic.     Nose: Nose normal.  Pulmonary:     Effort: Pulmonary effort is normal.  Musculoskeletal:     Cervical back: Normal range of motion.  Neurological:     Mental Status: She is alert.     Mental Status Exam: General Appearance: Casual  Orientation:  person only  Memory:  Immediate;   Poor Recent;   Poor Remote;   Poor  Concentration:  Concentration: Poor and Attention Span: Poor  Recall:  Poor  Attention  Poor  Eye Contact:  Fair  Speech:  Slurred at times  Language:  Fair  Volume:  Normal  Mood: anxious  Affect:  Blunt  Thought Process:  Irrelevant  Thought Content:  Hallucinations: Visual  Suicidal Thoughts:  No  Homicidal Thoughts:  No  Judgement:  Impaired  Insight:  Lacking  Psychomotor Activity:  Increased  Akathisia:  No  Fund of Knowledge:  Fair      Assets:  Housing Leisure Time Resilience Social Support  Cognition:  Impaired,  Moderate  ADL's:  Impaired  AIMS (if indicated):        Other History   These have been pulled in through the EMR, reviewed, and updated if appropriate.  Family History:  The patient's family history includes Dementia in her mother; Heart attack in her father; Heart disease in her father; Hyperlipidemia in her mother; Hypertension in her mother.  Medical History: Past Medical History:  Diagnosis Date   ADHD    Anxiety    Bronchitis, chronic (HCC)    Cervical cancer (HCC)    Depression    Depression    Hyperlipidemia    Hypertension    Insomnia disorder    Lyme disease    Migraine    Ovarian cancer (HCC)    Palpitations     Surgical History: Past Surgical History:   Procedure Laterality Date   ABDOMINAL HYSTERECTOMY  1994   ANTERIOR CERVICAL DECOMP/DISCECTOMY FUSION  2000   AUGMENTATION MAMMAPLASTY Bilateral 1998   BREAST ENHANCEMENT SURGERY Bilateral    EYE SURGERY  2016   PVD     Medications:   Current Facility-Administered Medications:    ALPRAZolam (XANAX) tablet 0.5 mg, 0.5 mg, Oral, BID, Emberlynn Riggan Y, NP, 0.5 mg at 02/29/24 1105   Ampicillin-Sulbactam (UNASYN) 3 g in sodium chloride 0.9 % 100 mL IVPB, 3 g, Intravenous, Q8H, Leander Rams, RPH, Last Rate: 200 mL/hr at 02/29/24 1113, 3 g  at 02/29/24 1113   aspirin EC tablet 81 mg, 81 mg, Oral, Daily, Skip Mayer A, MD, 81 mg at 02/29/24 1059   bisacodyl (DULCOLAX) suppository 10 mg, 10 mg, Rectal, Once, Rodolph Bong, MD   doxycycline (VIBRA-TABS) tablet 100 mg, 100 mg, Oral, Q12H, Leander Rams, RPH, 100 mg at 02/29/24 1059   enoxaparin (LOVENOX) injection 40 mg, 40 mg, Subcutaneous, Q24H, Rodolph Bong, MD, 40 mg at 02/28/24 2054   escitalopram (LEXAPRO) tablet 5 mg, 5 mg, Oral, Daily, Rodolph Bong, MD, 5 mg at 02/29/24 1100   famotidine (PEPCID) tablet 20 mg, 20 mg, Oral, QHS, Rodolph Bong, MD, 20 mg at 02/28/24 2054   gabapentin (NEURONTIN) capsule 100 mg, 100 mg, Oral, BID, Shaune Pollack, Tirsa Gail Y, NP, 100 mg at 02/29/24 1106   Gerhardt's butt cream, , Topical, PRN, Rodolph Bong, MD, Given at 02/29/24 0150   levothyroxine (SYNTHROID) tablet 50 mcg, 50 mcg, Oral, QAC breakfast, Rodolph Bong, MD, 50 mcg at 02/29/24 1109   magnesium sulfate IVPB 2 g 50 mL, 2 g, Intravenous, Once, Rodolph Bong, MD   multivitamin with minerals tablet 1 tablet, 1 tablet, Oral, Q24H, Emmogene Simson Y, NP, 1 tablet at 02/29/24 1105   OLANZapine (ZYPREXA) tablet 2.5 mg, 2.5 mg, Oral, BID, Derrel Moore Y, NP, 2.5 mg at 02/29/24 1123   pantoprazole (PROTONIX) EC tablet 40 mg, 40 mg, Oral, Daily, Rodolph Bong, MD, 40 mg at 02/29/24 1105   phosphorus (K PHOS NEUTRAL)  tablet 250 mg, 250 mg, Oral, BID, Rodolph Bong, MD, 250 mg at 02/29/24 1100   polyethylene glycol (MIRALAX / GLYCOLAX) packet 17 g, 17 g, Oral, Daily PRN, Rodolph Bong, MD   rosuvastatin (CRESTOR) tablet 10 mg, 10 mg, Oral, Daily, Rodolph Bong, MD, 10 mg at 02/29/24 1059   senna-docusate (Senokot-S) tablet 1 tablet, 1 tablet, Oral, QHS PRN, Rodolph Bong, MD   sodium chloride flush (NS) 0.9 % injection 10-40 mL, 10-40 mL, Intracatheter, Q12H, Rodolph Bong, MD, 10 mL at 02/29/24 1101   sodium chloride flush (NS) 0.9 % injection 10-40 mL, 10-40 mL, Intracatheter, PRN, Rodolph Bong, MD   thiamine (VITAMIN B1) tablet 100 mg, 100 mg, Oral, Daily, Nanine Means Y, NP, 100 mg at 02/29/24 1059  Allergies: Allergies  Allergen Reactions   Amlodipine Swelling and Other (See Comments)    Leg edema and fluid retention   Atorvastatin Other (See Comments)    Other reaction(s): back ache   Bystolic [Nebivolol Hcl] Other (See Comments)    sweat and get clammy   Carbamazepine Other (See Comments)    rash   Mirtazapine Other (See Comments)    Other reaction(s): edema, vivid nightmares, patient was unsure    Penicillins Nausea And Vomiting   Sulfacetamide Nausea Only    Nanine Means, NP

## 2024-02-29 NOTE — Progress Notes (Signed)
 PROGRESS NOTE    Sharon Huffman  WGN:[id number] DOB: [birth year] DOA: 02/23/2024 PCP: Merri Brunette, MD    Chief Complaint  Patient presents with   Drug Overdose    Brief Narrative:  Patient is a 63 year old female past medical history significant for anxiety, depression, hyperlipidemia, hypertension, hypothyroidism, Lyme's disease, migraine headache and ADHD.  Patient was found down at home (unresponsive) by her husband.  Patient had agonal breathing, with O2 sat of 70% on nonrebreather.  Patient was administered IM Narcan 2 Mg and Glasgow Coma Scale improved from 4-13.  O2 sat improved to 90% on nonrebreather.  Patient lost her mother recently.  Patient is known to have access to morphine, Xanax and Norco.  There are concerns that the patient may have taken the medications.  Psychiatric team has been consulted.   02/24/2024: Patient seen alongside patient's husband.  Patient is awake and alert, patient remains drowsy.  Patient is not able to give significant history.  Patient vomiting reported.  Chest x-ray concerning for possible aspiration/multifocal pneumonia.  Patient is currently on IV Unasyn.   02/25/2024: Patient is awake, but remains drowsy.  Not able to give coherent history.   Assessment & Plan:   Principal Problem:   Overdose Active Problems:   Depression   Hypophosphatemia   Hyperkalemia   Hypomagnesemia   ADHD   Aspiration pneumonia (HCC)   Nausea and vomiting   Dehydration   AKI (acute kidney injury) (HCC)   Hyperlipidemia   Hypothyroidism   Major depressive disorder, recurrent severe without psychotic features (HCC)  #1 drug overdose -Likely intentional intentional. -Patient recently noted to have lost her mother who died in her arms per patient and since then patient has felt depressed and etiology of why she took medications however cannot quite tell me what specific medications and how much she took. -Patient currently denying any suicidal ideation or homicidal  ideation at this time. -Drug overdose complicated by aspiration and patient on IV Unasyn and doxycycline. -Continue one-to-one sitter. -Continue restraints for now as patient noted to be agitated this morning with auditory hallucinations and yelling. -Status post IVC. -Psychiatry consulted who have assessed patient, adjustments to medications and recommendations made as noted in problem #9. -Psychiatry recommending inpatient psychiatric hospitalization once patient is medically stable..  2.  Probable aspiration pneumonia -Continue IV Unasyn and doxycycline. -SLP following.   -Was tolerating a full liquid diet and diet advanced to dysphagia 2 diet per SLP.   3.  AKI -Secondary to prerenal azotemia in the setting of spironolactone and Lasix as needed. -Patient clinically dry on examination early on in the hospitalization. -Urinalysis nitrite negative, leukocytes negative, negative for protein, 0-5 WBCs, -Renal function improved with hydration.   -Saline lock IV fluids.  4.  Dehydration -Saline lock IV fluids.  5.  Hyperlipidemia -Continue statin.  6.  Mild hyperkalemia -Likely secondary to AKI. -Resolved.  7.  Hypophosphatemia/hypomagnesemia -Phosphorus at 2.2.   -Magnesium at 1.7.   -Continue K-Phos 250 mg twice daily x 3 days. -Repeat labs in the AM.  8.  Hypertension -BP currently stable. -Continue to hold antihypertensive medications.  9.  ADHD/depression/anxiety/agitation/delirium -Patient noted to be hallucinating per RN and NT. -Patient also noted with bouts of confusion and auditory hallucinations, states she has been speaking with her mother who recently passed. -Patient also with bouts of agitation and yelling. -Patient started on Ativan 0.5 mg 3 times daily per psychiatry to avoid withdrawal as patient noted to be on Xanax as needed prior  to admission. -Patient seen in consultation by psychiatry and scheduled Ativan and as needed Ativan discontinued. -Patient  started on Xanax 0.5 mg twice daily as well as gabapentin 100 mg twice daily per psychiatry. -Seroquel discontinued. -Patient started on Zyprexa 2.5 mg twice daily. -Home regimen Prozac discontinued and patient started on Lexapro 5 mg daily per psychiatric recommendations. -Per psychiatry recommended inpatient psychiatric hospitalization once patient medically stable.  10.  Nausea and vomiting -Clinical improvement.   -No further nausea or vomiting. -Status post Dulcolax suppository x 1.   -Laxatives changed to as needed as patient was starting to have diarrhea. -Supportive care.  11.  Migraine headaches -Stable.  12.  Hypothyroidism -Continue Synthroid.  13.  Hypokalemia -Secondary to GI losses as patient noted to have multiple watery loose stools per RN and sitter on 02/27/2024.   -Laxatives changed to as needed.   -Potassium repleted currently at 3.5 this morning.  -Repeat labs in the AM.  14.  Diarrhea -Patient with multiple watery loose stools on laxatives. -Diarrhea improved. -Laxatives discontinued and changed to as needed.     DVT prophylaxis: Heparin>>> Lovenox Code Status: Full Family Communication: No family at bedside. Disposition: Inpatient psychiatry.  Status is: Inpatient Remains inpatient appropriate because: Severity of illness   Consultants:  Psychiatry pending  Procedures:  CT head 02/23/2024 Chest x-ray 02/23/2024, 02/24/2024 MRI L-spine 02/24/2024 MRI brain 02/24/2024  Antimicrobials:  Anti-infectives (From admission, onward)    Start     Dose/Rate Route Frequency Ordered Stop   02/25/24 2200  doxycycline (VIBRA-TABS) tablet 100 mg        100 mg Oral Every 12 hours 02/25/24 1042     02/25/24 1800  Ampicillin-Sulbactam (UNASYN) 3 g in sodium chloride 0.9 % 100 mL IVPB        3 g 200 mL/hr over 30 Minutes Intravenous Every 8 hours 02/25/24 1539     02/24/24 1000  Ampicillin-Sulbactam (UNASYN) 3 g in sodium chloride 0.9 % 100 mL IVPB  Status:   Discontinued        3 g 200 mL/hr over 30 Minutes Intravenous Every 12 hours 02/24/24 0127 02/25/24 1539   02/23/24 2000  cefTRIAXone (ROCEPHIN) 2 g in sodium chloride 0.9 % 100 mL IVPB        2 g 200 mL/hr over 30 Minutes Intravenous Once 02/23/24 1947 02/23/24 2059   02/23/24 2000  doxycycline (VIBRAMYCIN) 100 mg in sodium chloride 0.9 % 250 mL IVPB  Status:  Discontinued        100 mg 125 mL/hr over 120 Minutes Intravenous Every 12 hours 02/23/24 1947 02/25/24 1042         Subjective: Laying in bed.  Has bilateral restraints.  Alert to self.  Knows she is in [facility] [place].  Knows the president is Trump.  Not sure where she is.  Patient with auditory hallucinations.  Patient with bouts of confusion.   Patient denies any suicidal or homicidal ideation.    Objective: Vitals:   02/29/24 0036 02/29/24 0446 02/29/24 0822 02/29/24 1722  BP: (!) 147/85 135/86 135/82 138/80  Pulse: 100 (!) 102 (!) 101 96  Resp: 18 20 18 18   Temp: 98.4 F (36.9 C) 98.4 F (36.9 C) 97.6 F (36.4 C)   TempSrc: Oral     SpO2: 100% 99% 100% 100%  Weight:      Height:        Intake/Output Summary (Last 24 hours) at 02/29/2024 1757 Last data filed at 02/28/2024  1949 Gross per 24 hour  Intake 120 ml  Output --  Net 120 ml   Filed Weights   02/23/24 1927  Weight: 65.3 kg    Examination:  General exam: NAD.Marland Kitchen  Dry mucous membranes. Respiratory system: Lungs clear to auscultation bilaterally anterior lung fields.  No wheezes, no crackles, no rhonchi.  Fair air movement.  Speaking in full sentences.  Cardiovascular system: RRR no murmurs rubs or gallops.  No JVD.  No pitting lower extremity edema.  Gastrointestinal system: Abdomen is soft, nontender, nondistended, positive bowel sounds.  No rebound.  No guarding. Central nervous system: Alert and oriented.  Moving extremities spontaneously.  No focal neurological deficits. Extremities: Symmetric 5 x 5 power. Skin: No rashes,  lesions or ulcers Psychiatry: Judgement and insight appear poor. Mood & affect depressed.     Data Reviewed: I have personally reviewed following labs and imaging studies  CBC: Recent Labs  Lab 02/23/24 1922 02/23/24 1952 02/25/24 0640 02/26/24 0542 02/27/24 0339 02/28/24 0324 02/29/24 0343  WBC 11.3*   < > 10.0 11.4* 8.4 7.3 7.0  NEUTROABS 10.1*  --  8.8* 10.5*  --   --   --   HGB 13.2   < > 10.5* 11.9* 10.5* 10.2* 10.3*  HCT 41.9   < > 30.7* 35.7* 30.7* 30.8* 31.3*  MCV 100.7*   < > 94.8 96.0 93.9 96.3 96.3  PLT 246   < > 178 201 201 205 228   < > = values in this interval not displayed.    Basic Metabolic Panel: Recent Labs  Lab 02/25/24 0640 02/26/24 0542 02/27/24 0339 02/27/24 2004 02/28/24 0324 02/29/24 0343  NA 141 140 139 143 142 141  K 3.7 3.5 2.8* 3.5 3.6 3.5  CL 107 104 109 114* 114* 111  CO2 24 20* 19* 20* 19* 19*  GLUCOSE 94 91 92 94 106* 98  BUN 15 11 9  6* 5* 7*  CREATININE 1.18* 1.21* 0.91 1.00 1.02* 1.05*  CALCIUM 9.2 9.6 8.4* 8.5* 8.6* 8.8*  MG 1.4* 1.8 1.9  --  1.8 1.7  PHOS 1.7* 2.6 2.9  --  2.2*  --     GFR: Estimated Creatinine Clearance: 48.7 mL/min (A) (by C-G formula based on SCr of 1.05 mg/dL (H)).  Liver Function Tests: Recent Labs  Lab 02/23/24 2130 02/25/24 0640 02/26/24 0542 02/27/24 0339 02/28/24 0324  AST 27  --   --   --   --   ALT 18  --   --   --   --   ALKPHOS 56  --   --   --   --   BILITOT 1.0  --   --   --   --   PROT 5.5*  --   --   --   --   ALBUMIN 3.1* 3.0* 3.5 2.8* 2.8*    CBG: No results for input(s): "GLUCAP" in the last 168 hours.   Recent Results (from the past 240 hours)  Resp panel by RT-PCR (RSV, Flu A&B, Covid) Anterior Nasal Swab     Status: None   Collection Time: 02/23/24  8:10 PM   Specimen: Anterior Nasal Swab  Result Value Ref Range Status   SARS Coronavirus 2 by RT PCR NEGATIVE NEGATIVE Final   Influenza A by PCR NEGATIVE NEGATIVE Final   Influenza B by PCR NEGATIVE NEGATIVE Final     Comment: (NOTE) The Xpert Xpress SARS-CoV-2/FLU/RSV plus assay is intended as an aid in the diagnosis  of influenza from Nasopharyngeal swab specimens and should not be used as a sole basis for treatment. Nasal washings and aspirates are unacceptable for Xpert Xpress SARS-CoV-2/FLU/RSV testing.  Fact Sheet for Patients: BloggerCourse.com  Fact Sheet for Healthcare Providers: SeriousBroker.it  This test is not yet approved or cleared by the [place] FDA and has been authorized for detection and/or diagnosis of SARS-CoV-2 by FDA under an Emergency Use Authorization (EUA). This EUA will remain in effect (meaning this test can be used) for the duration of the COVID-19 declaration under Section 564(b)(1) of the Act, 21 U.S.C. section 360bbb-3(b)(1), unless the authorization is terminated or revoked.     Resp Syncytial Virus by PCR NEGATIVE NEGATIVE Final    Comment: (NOTE) Fact Sheet for Patients: BloggerCourse.com  Fact Sheet for Healthcare Providers: SeriousBroker.it  This test is not yet approved or cleared by the [place] FDA and has been authorized for detection and/or diagnosis of SARS-CoV-2 by FDA under an Emergency Use Authorization (EUA). This EUA will remain in effect (meaning this test can be used) for the duration of the COVID-19 declaration under Section 564(b)(1) of the Act, 21 U.S.C. section 360bbb-3(b)(1), unless the authorization is terminated or revoked.  Performed at [facility], [address] [place], [place] [address]   Blood Culture (routine x 2)     Status: None   Collection Time: 02/23/24  8:10 PM   Specimen: BLOOD  Result Value Ref Range Status   Specimen Description BLOOD RIGHT ANTECUBITAL  Final   Special Requests   Final    BOTTLES DRAWN AEROBIC AND ANAEROBIC Blood Culture results may not be optimal due to an inadequate volume of  blood received in culture bottles   Culture   Final    NO GROWTH 5 DAYS Performed at [facility], [address], [place], [place] [address]    Report Status 02/28/2024 FINAL  Final         Radiology Studies: No results found.       Scheduled Meds:  ALPRAZolam  0.5 mg Oral BID   aspirin EC  81 mg Oral Daily   bisacodyl  10 mg Rectal Once   doxycycline  100 mg Oral Q12H   enoxaparin (LOVENOX) injection  40 mg Subcutaneous Q24H   escitalopram  5 mg Oral Daily   famotidine  20 mg Oral QHS   gabapentin  100 mg Oral BID   levothyroxine  50 mcg Oral QAC breakfast   multivitamin with minerals  1 tablet Oral Q24H   OLANZapine  2.5 mg Oral BID   pantoprazole  40 mg Oral Daily   phosphorus  250 mg Oral BID   rosuvastatin  10 mg Oral Daily   sodium chloride flush  10-40 mL Intracatheter Q12H   thiamine  100 mg Oral Daily   Continuous Infusions:  ampicillin-sulbactam (UNASYN) IV 3 g (02/29/24 1113)     LOS: 5 days    Time spent: 40 minutes    Ramiro Harvest, MD [facility]   To contact the attending provider between 7A-7P or the covering provider during after hours 7P-7A, please log into the web site [email or web address] and access using universal [place] password for that web site. If you do not have the password, please call the hospital operator.  02/29/2024, 5:57 PM

## 2024-03-01 DIAGNOSIS — E039 Hypothyroidism, unspecified: Secondary | ICD-10-CM | POA: Diagnosis not present

## 2024-03-01 DIAGNOSIS — T50902D Poisoning by unspecified drugs, medicaments and biological substances, intentional self-harm, subsequent encounter: Secondary | ICD-10-CM | POA: Diagnosis not present

## 2024-03-01 DIAGNOSIS — F909 Attention-deficit hyperactivity disorder, unspecified type: Secondary | ICD-10-CM | POA: Diagnosis not present

## 2024-03-01 DIAGNOSIS — F32A Depression, unspecified: Secondary | ICD-10-CM | POA: Diagnosis not present

## 2024-03-01 LAB — CBC
HCT: 32.5 % — ABNORMAL LOW (ref 36.0–46.0)
Hemoglobin: 10.9 g/dL — ABNORMAL LOW (ref 12.0–15.0)
MCH: 32.2 pg (ref 26.0–34.0)
MCHC: 33.5 g/dL (ref 30.0–36.0)
MCV: 96.2 fL (ref 80.0–100.0)
Platelets: 279 10*3/uL (ref 150–400)
RBC: 3.38 MIL/uL — ABNORMAL LOW (ref 3.87–5.11)
RDW: 12.6 % (ref 11.5–15.5)
WBC: 7.6 10*3/uL (ref 4.0–10.5)
nRBC: 0 % (ref 0.0–0.2)

## 2024-03-01 LAB — RENAL FUNCTION PANEL
Albumin: 2.8 g/dL — ABNORMAL LOW (ref 3.5–5.0)
Anion gap: 12 (ref 5–15)
BUN: 10 mg/dL (ref 8–23)
CO2: 22 mmol/L (ref 22–32)
Calcium: 9 mg/dL (ref 8.9–10.3)
Chloride: 111 mmol/L (ref 98–111)
Creatinine, Ser: 1.15 mg/dL — ABNORMAL HIGH (ref 0.44–1.00)
GFR, Estimated: 53 mL/min — ABNORMAL LOW (ref >60)
Glucose, Bld: 127 mg/dL — ABNORMAL HIGH (ref 70–99)
Phosphorus: 4 mg/dL (ref 2.5–4.6)
Potassium: 3.8 mmol/L (ref 3.5–5.1)
Sodium: 145 mmol/L (ref 135–145)

## 2024-03-01 LAB — MAGNESIUM: Magnesium: 2.2 mg/dL (ref 1.7–2.4)

## 2024-03-01 NOTE — Progress Notes (Addendum)
 PROGRESS NOTE    Sharon Huffman  OVF:[id number] DOB: Mar 07, 1961 DOA: 02/23/2024 PCP: Merri Brunette, MD    Chief Complaint  Patient presents with   Drug Overdose    Brief Narrative:  Patient is a 63 year old female past medical history significant for anxiety, depression, hyperlipidemia, hypertension, hypothyroidism, Lyme's disease, migraine headache and ADHD.  Patient was found down at home (unresponsive) by her husband.  Patient had agonal breathing, with O2 sat of 70% on nonrebreather.  Patient was administered IM Narcan 2 Mg and Glasgow Coma Scale improved from 4-13.  O2 sat improved to 90% on nonrebreather.  Patient lost her mother recently.  Patient is known to have access to morphine, Xanax and Norco.  There are concerns that the patient may have taken the medications.  Psychiatric team has been consulted.   02/24/2024: Patient seen alongside patient's husband.  Patient is awake and alert, patient remains drowsy.  Patient is not able to give significant history.  Patient vomiting reported.  Chest x-ray concerning for possible aspiration/multifocal pneumonia.  Patient is currently on IV Unasyn.   02/25/2024: Patient is awake, but remains drowsy.  Not able to give coherent history.   Assessment & Plan:   Principal Problem:   Overdose Active Problems:   Depression   Hypophosphatemia   Hyperkalemia   Hypomagnesemia   ADHD   Aspiration pneumonia (HCC)   Nausea and vomiting   Dehydration   AKI (acute kidney injury) (HCC)   Hyperlipidemia   Hypothyroidism   Major depressive disorder, recurrent severe without psychotic features (HCC)  #1 drug overdose -Likely intentional overdose. -Patient recently noted to have lost her mother who died in her arms per patient and since then patient has felt depressed and etiology of why she took medications however cannot quite tell me what specific medications and how much she took. -Patient currently denying any suicidal ideation or homicidal  ideation at this time. -Drug overdose complicated by aspiration and patient on IV Unasyn and doxycycline. -Continue one-to-one sitter. -Patient on restraints due to agitation and auditory hallucinations. -Status post IVC. -Psychiatry consulted who have assessed patient, adjustments to medications and recommendations made as noted in problem #9. -Psychiatry recommending inpatient psychiatric hospitalization once patient is medically stable.  2.  Probable aspiration pneumonia -Continue IV Unasyn and doxycycline. -Was tolerating a full liquid diet and diet advanced to dysphagia 2 diet per SLP.  -Patient with clinical improvement we will advance to a dysphagia 3 diet. -SLP following.  3.  AKI -Secondary to prerenal azotemia in the setting of spironolactone and Lasix as needed. -Patient clinically dry on examination early on in the hospitalization. -Urinalysis nitrite negative, leukocytes negative, negative for protein, 0-5 WBCs, -Renal function improved with hydration.   -IV fluids have been discontinued.  4.  Dehydration -Saline lock IV fluids.  5.  Hyperlipidemia -Statin.   6.  Mild hyperkalemia -Likely secondary to AKI. -Resolved.  7.  Hypophosphatemia/hypomagnesemia -Phosphorus at 4.   -Magnesium at 2.2 -Continue K-Phos 250 mg twice daily x total of 3 days. -Repeat labs in the AM.  8.  Hypertension -BP currently stable. -Continue to hold antihypertensive medications.  9.  ADHD/depression/anxiety/agitation/delirium -Patient noted to be hallucinating per RN and NT. -Patient also noted with bouts of confusion and auditory hallucinations, states she has been speaking with her mother who recently passed on Mar 05, 2024. -Patient also with bouts of agitation and yelling. -Patient with clinical improvement, denies any further auditory hallucinations. -Agitation seems to have improved. -Patient started on Ativan 0.5  mg 3 times daily per psychiatry to avoid withdrawal as patient  noted to be on Xanax as needed prior to admission. -Patient seen in consultation by psychiatry and scheduled Ativan and as needed Ativan discontinued. -Patient started on Xanax 0.5 mg twice daily as well as gabapentin 100 mg twice daily per psychiatry. -Seroquel discontinued. -Patient started on Zyprexa 2.5 mg twice daily per psychiatry. -Home regimen Prozac discontinued and patient started on Lexapro 5 mg daily per psychiatric recommendations. -Trial off restraints today. -Continue delirium precautions. -Per psychiatry recommended inpatient psychiatric hospitalization once patient medically stable.  10.  Nausea and vomiting -Clinical improvement.   -No further nausea or vomiting. -Status post Dulcolax suppository x 1.   -Laxatives changed to as needed as patient was starting to have diarrhea. -Supportive care.  11.  Migraine headaches -Stable.  12.  Hypothyroidism -Synthroid.   13.  Hypokalemia -Secondary to GI losses as patient noted to have multiple watery loose stools per RN and sitter on 02/27/2024.   -Laxatives changed to as needed.   -Potassium repleted currently at 3.8 this morning.  -Repeat labs in the AM.  14.  Diarrhea -Patient with multiple watery loose stools on laxatives. -Diarrhea improved. -Laxatives discontinued and changed to as needed.     DVT prophylaxis: Heparin>>> Lovenox Code Status: Full Family Communication: No family at bedside. Disposition: Inpatient psychiatry.  Status is: Inpatient Remains inpatient appropriate because: Severity of illness   Consultants:  Psychiatry pending  Procedures:  CT head 02/23/2024 Chest x-ray 02/23/2024, 02/24/2024 MRI L-spine 02/24/2024 MRI brain 02/24/2024  Antimicrobials:  Anti-infectives (From admission, onward)    Start     Dose/Rate Route Frequency Ordered Stop   02/25/24 2200  doxycycline (VIBRA-TABS) tablet 100 mg        100 mg Oral Every 12 hours 02/25/24 1042     02/25/24 1800  Ampicillin-Sulbactam  (UNASYN) 3 g in sodium chloride 0.9 % 100 mL IVPB        3 g 200 mL/hr over 30 Minutes Intravenous Every 8 hours 02/25/24 1539     02/24/24 1000  Ampicillin-Sulbactam (UNASYN) 3 g in sodium chloride 0.9 % 100 mL IVPB  Status:  Discontinued        3 g 200 mL/hr over 30 Minutes Intravenous Every 12 hours 02/24/24 0127 02/25/24 1539   02/23/24 2000  cefTRIAXone (ROCEPHIN) 2 g in sodium chloride 0.9 % 100 mL IVPB        2 g 200 mL/hr over 30 Minutes Intravenous Once 02/23/24 1947 02/23/24 2059   02/23/24 2000  doxycycline (VIBRAMYCIN) 100 mg in sodium chloride 0.9 % 250 mL IVPB  Status:  Discontinued        100 mg 125 mL/hr over 120 Minutes Intravenous Every 12 hours 02/23/24 1947 02/25/24 1042         Subjective: Patient lying in bed.  Bilateral restraints on.  Denies any chest pain or shortness of breath.  No abdominal pain.  Tolerating current diet.  Alert and oriented to self place and time.  Knows the president is Trump.  Denies any auditory or visual hallucinations today.  Denies any suicidal or homicidal ideations today.  Objective: Vitals:   02/29/24 1722 02/29/24 2118 03/01/24 0424 03/01/24 0742  BP: 138/80 134/71 (!) 140/73 125/73  Pulse: 96 96 98 (!) 106  Resp: 18 17 17 16   Temp: 97.6 F (36.4 C) 98.4 F (36.9 C) 98.7 F (37.1 C) 98.8 F (37.1 C)  TempSrc: Oral Oral Oral Oral  SpO2:  100% 98% 98% 99%  Weight:      Height:        Intake/Output Summary (Last 24 hours) at 03/01/2024 1243 Last data filed at 03/01/2024 1150 Gross per 24 hour  Intake 2250.8 ml  Output 2225 ml  Net 25.8 ml   Filed Weights   02/23/24 1927  Weight: 65.3 kg    Examination:  General exam: NAD.  Dry mucous membranes. Respiratory system: CTAB.  No wheezes, no crackles, no rhonchi.  Fair air movement.  Speaking in full sentences.  Cardiovascular system: Regular rate rhythm no murmurs rubs or gallops.  No JVD.  No pitting lower extremity edema.  Gastrointestinal system: Abdomen is soft,  nontender, nondistended, positive bowel sounds.  No rebound.  No guarding.  Central nervous system: Alert and oriented.  Moving extremities spontaneously.  No focal neurological deficits. Extremities: Symmetric 5 x 5 power. Skin: No rashes, lesions or ulcers Psychiatry: Judgement and insight appear poor. Mood & affect depressed.     Data Reviewed: I have personally reviewed following labs and imaging studies  CBC: Recent Labs  Lab 02/23/24 1922 02/23/24 1952 02/25/24 0640 02/26/24 0542 02/27/24 0339 02/28/24 0324 02/29/24 0343 03/01/24 0134  WBC 11.3*   < > 10.0 11.4* 8.4 7.3 7.0 7.6  NEUTROABS 10.1*  --  8.8* 10.5*  --   --   --   --   HGB 13.2   < > 10.5* 11.9* 10.5* 10.2* 10.3* 10.9*  HCT 41.9   < > 30.7* 35.7* 30.7* 30.8* 31.3* 32.5*  MCV 100.7*   < > 94.8 96.0 93.9 96.3 96.3 96.2  PLT 246   < > 178 201 201 205 228 279   < > = values in this interval not displayed.    Basic Metabolic Panel: Recent Labs  Lab 02/25/24 0640 02/26/24 0542 02/27/24 0339 02/27/24 2004 02/28/24 0324 02/29/24 0343 03/01/24 0134  NA 141 140 139 143 142 141 145  K 3.7 3.5 2.8* 3.5 3.6 3.5 3.8  CL 107 104 109 114* 114* 111 111  CO2 24 20* 19* 20* 19* 19* 22  GLUCOSE 94 91 92 94 106* 98 127*  BUN 15 11 9  6* 5* 7* 10  CREATININE 1.18* 1.21* 0.91 1.00 1.02* 1.05* 1.15*  CALCIUM 9.2 9.6 8.4* 8.5* 8.6* 8.8* 9.0  MG 1.4* 1.8 1.9  --  1.8 1.7 2.2  PHOS 1.7* 2.6 2.9  --  2.2*  --  4.0    GFR: Estimated Creatinine Clearance: 44.5 mL/min (A) (by C-G formula based on SCr of 1.15 mg/dL (H)).  Liver Function Tests: Recent Labs  Lab 02/23/24 2130 02/25/24 0640 02/26/24 0542 02/27/24 0339 02/28/24 0324 03/01/24 0134  AST 27  --   --   --   --   --   ALT 18  --   --   --   --   --   ALKPHOS 56  --   --   --   --   --   BILITOT 1.0  --   --   --   --   --   PROT 5.5*  --   --   --   --   --   ALBUMIN 3.1* 3.0* 3.5 2.8* 2.8* 2.8*    CBG: No results for input(s): "GLUCAP" in the last  168 hours.   Recent Results (from the past 240 hours)  Resp panel by RT-PCR (RSV, Flu A&B, Covid) Anterior Nasal Swab     Status:  None   Collection Time: 02/23/24  8:10 PM   Specimen: Anterior Nasal Swab  Result Value Ref Range Status   SARS Coronavirus 2 by RT PCR NEGATIVE NEGATIVE Final   Influenza A by PCR NEGATIVE NEGATIVE Final   Influenza B by PCR NEGATIVE NEGATIVE Final    Comment: (NOTE) The Xpert Xpress SARS-CoV-2/FLU/RSV plus assay is intended as an aid in the diagnosis of influenza from Nasopharyngeal swab specimens and should not be used as a sole basis for treatment. Nasal washings and aspirates are unacceptable for Xpert Xpress SARS-CoV-2/FLU/RSV testing.  Fact Sheet for Patients: BloggerCourse.com  Fact Sheet for Healthcare Providers: SeriousBroker.it  This test is not yet approved or cleared by the [place] FDA and has been authorized for detection and/or diagnosis of SARS-CoV-2 by FDA under an Emergency Use Authorization (EUA). This EUA will remain in effect (meaning this test can be used) for the duration of the COVID-19 declaration under Section 564(b)(1) of the Act, 21 U.S.C. section 360bbb-3(b)(1), unless the authorization is terminated or revoked.     Resp Syncytial Virus by PCR NEGATIVE NEGATIVE Final    Comment: (NOTE) Fact Sheet for Patients: BloggerCourse.com  Fact Sheet for Healthcare Providers: SeriousBroker.it  This test is not yet approved or cleared by the [place] FDA and has been authorized for detection and/or diagnosis of SARS-CoV-2 by FDA under an Emergency Use Authorization (EUA). This EUA will remain in effect (meaning this test can be used) for the duration of the COVID-19 declaration under Section 564(b)(1) of the Act, 21 U.S.C. section 360bbb-3(b)(1), unless the authorization is terminated or revoked.  Performed at [facility], [address], [place], [place] [address]   Blood Culture (routine x 2)     Status: None   Collection Time: 02/23/24  8:10 PM   Specimen: BLOOD  Result Value Ref Range Status   Specimen Description BLOOD RIGHT ANTECUBITAL  Final   Special Requests   Final    BOTTLES DRAWN AEROBIC AND ANAEROBIC Blood Culture results may not be optimal due to an inadequate volume of blood received in culture bottles   Culture   Final    NO GROWTH 5 DAYS Performed at [facility], [address], [place], [place] [address]    Report Status 02/28/2024 FINAL  Final         Radiology Studies: No results found.       Scheduled Meds:  ALPRAZolam  0.5 mg Oral BID   aspirin EC  81 mg Oral Daily   doxycycline  100 mg Oral Q12H   enoxaparin (LOVENOX) injection  40 mg Subcutaneous Q24H   escitalopram  5 mg Oral Daily   famotidine  20 mg Oral QHS   gabapentin  100 mg Oral BID   levothyroxine  50 mcg Oral QAC breakfast   multivitamin with minerals  1 tablet Oral Q24H   OLANZapine  2.5 mg Oral BID   pantoprazole  40 mg Oral Daily   phosphorus  250 mg Oral BID   rosuvastatin  10 mg Oral Daily   sodium chloride flush  10-40 mL Intracatheter Q12H   thiamine  100 mg Oral Daily   Continuous Infusions:  ampicillin-sulbactam (UNASYN) IV 3 g (03/01/24 1057)     LOS: 6 days    Time spent: 40 minutes    Ramiro Harvest, MD [facility]   To contact the attending provider between 7A-7P or the covering provider during after hours 7P-7A, please log into  the web site [email or web address] and access using universal  password for that web site. If you do not have the password, please call the hospital operator.  03/01/2024, 12:43 PM

## 2024-03-01 NOTE — Plan of Care (Signed)

## 2024-03-02 DIAGNOSIS — R338 Other retention of urine: Secondary | ICD-10-CM | POA: Clinically undetermined

## 2024-03-02 DIAGNOSIS — E039 Hypothyroidism, unspecified: Secondary | ICD-10-CM | POA: Diagnosis not present

## 2024-03-02 DIAGNOSIS — F32A Depression, unspecified: Secondary | ICD-10-CM | POA: Diagnosis not present

## 2024-03-02 DIAGNOSIS — F909 Attention-deficit hyperactivity disorder, unspecified type: Secondary | ICD-10-CM | POA: Diagnosis not present

## 2024-03-02 DIAGNOSIS — T50902D Poisoning by unspecified drugs, medicaments and biological substances, intentional self-harm, subsequent encounter: Secondary | ICD-10-CM | POA: Diagnosis not present

## 2024-03-02 DIAGNOSIS — E876 Hypokalemia: Secondary | ICD-10-CM

## 2024-03-02 MED ORDER — ACETAMINOPHEN 325 MG PO TABS
325.0000 mg | ORAL_TABLET | Freq: Once | ORAL | Status: AC
  Administered 2024-03-02: 325 mg via ORAL

## 2024-03-02 MED ORDER — ACETAMINOPHEN 325 MG PO TABS
650.0000 mg | ORAL_TABLET | Freq: Once | ORAL | Status: AC
  Administered 2024-03-02: 650 mg via ORAL
  Filled 2024-03-02: qty 2

## 2024-03-02 MED ORDER — AMOXICILLIN-POT CLAVULANATE 875-125 MG PO TABS
1.0000 | ORAL_TABLET | Freq: Two times a day (BID) | ORAL | Status: AC
  Administered 2024-03-02 – 2024-03-03 (×4): 1 via ORAL
  Filled 2024-03-02 (×4): qty 1

## 2024-03-02 NOTE — Consult Note (Addendum)
 [facility] Psychiatric Consult Follow up  Patient Name: .Sharon Huffman  MRN: [id number]  DOB: 11-Oct-1961  Consult Order details:  Orders (From admission, onward)     Start     Ordered   02/24/24 1553  IP CONSULT TO PSYCHIATRY       Ordering Provider: Barnetta Chapel, MD  Provider:  (Not yet assigned)  Question Answer Comment  Location [facility]   Reason for Consult? intentional drug overdose      02/24/24 1552             Mode of Visit: In person    Psychiatry Consult Evaluation  Service Date: March 02, 2024 LOS:  LOS: 7 days  Chief Complaint "I just need to get over saying that.  I feel pretty good.  I'm assuming I tried to kill myself because I ended up in the hospital for five months," and then confusion.  Primary Psychiatric Diagnoses  Major depressive disorder, recurrent, severe  2.  Delirium 3.  General anxiety d/o  Assessment  Sharon Huffman is a 63 y.o. female admitted: Medicallyfor 02/23/2024  7:22 PM for intentional overdose. She carries the psychiatric diagnoses of MDD and GAD and has a past medical history of asthma, hypothyroidism, AKI, chronic fatigue., and hyperlipidemia   Her current presentation of intermittent confusion with agitation is most consistent with delirium. She meets criteria for MDD based on grief issues, difficulty coping, and suicide attempt.  Current outpatient psychotropic medications include Xanax, Prozac, and Adderall and historically she has had a low response to these medications. She was compliant with medications prior to admission as evidenced by husband's report except she was overtaking them. On initial examination, patient had a brief moment of clarity but mostly confused. Please see plan below for detailed recommendations.   02/29/2024: Her cognition remains the same with pleasant confusion and restlessness, constantly trying to get out of the bed, restraints in place.  Rambles with irrelevant sentences, consistent  with delirium.  Her sitter reported that she did not sleep well last night.  She did eat her breakfast and had a bath without issues, no aggression.  The client continues to talk to people who are not there.  Consulted with the psychiatrist and medications changed to assist, hopefully.  03/02/24: On reassessment today, patient found, resting calmly bed with her eyes closed but responsive to conversation. Had breakfast this morning and on behavioral issues reported. She was able to report and describe her headache but the conversation thereafter remained disorganized and largely tangential. She believed that she was at a hotel and was unable to answer orientation questions. Patient continues to exhibit what appeared to be a waxing and waning presentation which is consistent with delirium. Patient was gently reoriented to reality. Tylenol was ordered for headache.    Diagnoses:  Active Hospital problems: Principal Problem:   Overdose Active Problems:   Depression   Hypophosphatemia   Hyperkalemia   Hypomagnesemia   ADHD   Aspiration pneumonia (HCC)   Nausea and vomiting   Dehydration   AKI (acute kidney injury) (HCC)   Hyperlipidemia   Hypothyroidism   Major depressive disorder, recurrent severe without psychotic features (HCC)    Plan   ## Psychiatric Medication Recommendations:  Continue Xanax 0.5 mg BID  Continue gabapentin 100 mg BID with potential to increase this Continue Zyprexa 2.5 mg BID Continue Lexapro 5 mg daily  Give Tylenol 650 mg po x 1 for headache  ## Medical Decision Making  Capacity: Not specifically addressed in this encounter  ## Further Work-up:  -- most recent EKG on 02/23/2024 had QtC of 458 -- Pertinent labwork reviewed earlier this admission includes: CMP, CBC with diff, UDS, and U/A   ## Disposition:-- We recommend inpatient psychiatric hospitalization when medically cleared. Patient is under voluntary admission status at this time; please IVC if  attempts to leave hospital.  ## Behavioral / Environmental: -Delirium Precautions: Delirium Interventions for Nursing and Staff: - RN to open blinds every AM. - To Bedside: Glasses, hearing aide, and pt's own shoes. Make available to patients. when possible and encourage use. - Encourage po fluids when appropriate, keep fluids within reach. - OOB to chair with meals. - Passive ROM exercises to all extremities with AM & PM care. - RN to assess orientation to person, time and place QAM and PRN. - Recommend extended visitation hours with familiar family/friends as feasible. - Staff to minimize disturbances at night. Turn off television when pt asleep or when not in use.    ## Safety and Observation Level:  - Based on my clinical evaluation, I estimate the patient to be at moderate risk of self harm in the current setting. - At this time, we recommend  routine. This decision is based on my review of the chart including patient's history and current presentation, interview of the patient, mental status examination, and consideration of suicide risk including evaluating suicidal ideation, plan, intent, suicidal or self-harm behaviors, risk factors, and protective factors. This judgment is based on our ability to directly address suicide risk, implement suicide prevention strategies, and develop a safety plan while the patient is in the clinical setting. Please contact our team if there is a concern that risk level has changed.  CSSR Risk Category:   Suicide Risk Assessment: Patient has following modifiable risk factors for suicide: under treated depression , which we are addressing by adjusting medications. Patient has following non-modifiable or demographic risk factors for suicide: history of suicide attempt Patient has the following protective factors against suicide: Access to outpatient mental health care and Supportive family  Thank you for this consult request. Recommendations have been communicated  to the primary team.  We will continue to follow at this time.   Marcell Anger, NP       History of Present Illness  Relevant Aspects of [facility] Course:  Admitted on 02/23/2024 for intentional overdose. They are delirious with some improvement today.   Patient Report:  Patient reports having "a bad headache."  She rambles about currently being in a hotel and unable to carry on a logical conversation. Sitter at the bedside states that patient has been calm and without no agitation this morning.   Psych ROS:  Depression: present Anxiety:  high Mania (lifetime and current): UTA Psychosis: (lifetime and current): hallucinating  Collateral information:  Her husband at her bedside reported she was "more subdued" and less agitated. "She kinda has some data at times, facts are buried with the confusion". He does feel she is doing better physically without so many medications , especially with opiates. Contributes the client to downward spiraling the death of her mother 5 weeks ago that she was caring for. This is when she started over taking her medications.   Review of Systems  Neurological:  Positive for sensory change.  Psychiatric/Behavioral:  Positive for hallucinations and memory loss. The patient is nervous/anxious.      Psychiatric and Social History  Psychiatric History:  Information collected from patient, chart,  sitter, and husband (3/12)  Prev Dx/Sx: MDD, grief, GAD, ADHD Current Psych Provider: [facility] Meds (current): Xanax, Prozac, Adderall Previous Med Trials: unknown Therapy: UTA  Prior Psych Hospitalization: none  Prior Self Harm: UTA Prior Violence: UTA  Family Psych History: UTA Family Hx suicide: UTA  Social History:  Living Situation: lives with her husband  Access to weapons/lethal means: none   Substance History UTA Her husband reported she was overtaking her medication at home   Exam Findings  Physical Exam:  Vital Signs:   Temp:  [98.6 F (37 C)-99.2 F (37.3 C)] 98.6 F (37 C) (03/17 0602) Pulse Rate:  [92-109] 92 (03/17 0602) Resp:  [18-19] 18 (03/17 0602) BP: (112-152)/(86-96) 152/96 (03/17 0602) SpO2:  [99 %] 99 % (03/17 0602) Blood pressure (!) 152/96, pulse 92, temperature 98.6 F (37 C), temperature source Oral, resp. rate 18, height 5\' 5"  (1.651 m), weight 65.3 kg, SpO2 99%. Body mass index is 23.96 kg/m.  Physical Exam Vitals and nursing note reviewed.  HENT:     Head: Normocephalic.     Comments: headache    Nose: Nose normal.  Pulmonary:     Effort: Pulmonary effort is normal.  Musculoskeletal:     Cervical back: Normal range of motion.  Neurological:     Mental Status: She is alert.     Mental Status Exam: General Appearance: Casual and Neat  Orientation:  person only  Memory:  Immediate;   Poor Recent;   Poor Remote;   Poor  Concentration:  Concentration: Poor and Attention Span: Poor  Recall:  Poor  Attention  Poor  Eye Contact:  Minimal  Speech:  Garbled at times  Language:  Fair  Volume:  Normal  Mood: not stated   Affect:  Blunt  Thought Process:  Disorganized and Irrelevant  Thought Content:  Illogical and Tangential  Suicidal Thoughts:   unable to assess due to confusion  Homicidal Thoughts:  unable to assess due to confusion  Judgement:  Impaired  Insight:  Lacking  Psychomotor Activity:  Normal  Akathisia:  No  Fund of Knowledge:   unable to assess      Assets:  Housing Leisure Time Resilience Social Support  Cognition:  Impaired,  Moderate  ADL's:  Impaired  AIMS (if indicated):        Other History   These have been pulled in through the EMR, reviewed, and updated if appropriate.  Family History:  The patient's family history includes Dementia in her mother; Heart attack in her father; Heart disease in her father; Hyperlipidemia in her mother; Hypertension in her mother.  Medical History: Past Medical History:  Diagnosis Date   ADHD     Anxiety    Bronchitis, chronic (HCC)    Cervical cancer (HCC)    Depression    Depression    Hyperlipidemia    Hypertension    Insomnia disorder    Lyme disease    Migraine    Ovarian cancer (HCC)    Palpitations     Surgical History: Past Surgical History:  Procedure Laterality Date   ABDOMINAL HYSTERECTOMY  1994   ANTERIOR CERVICAL DECOMP/DISCECTOMY FUSION  2000   AUGMENTATION MAMMAPLASTY Bilateral 1998   BREAST ENHANCEMENT SURGERY Bilateral    EYE SURGERY  2016   PVD     Medications:   Current Facility-Administered Medications:    ALPRAZolam (XANAX) tablet 0.5 mg, 0.5 mg, Oral, BID, Charm Rings, NP, 0.5 mg at 03/02/24 [phone]  Ampicillin-Sulbactam (UNASYN) 3 g in sodium chloride 0.9 % 100 mL IVPB, 3 g, Intravenous, Q8H, Leander Rams, RPH, Last Rate: 200 mL/hr at 03/02/24 0943, 3 g at 03/02/24 0943   aspirin EC tablet 81 mg, 81 mg, Oral, Daily, Skip Mayer A, MD, 81 mg at 03/02/24 0934   doxycycline (VIBRA-TABS) tablet 100 mg, 100 mg, Oral, Q12H, Leander Rams, RPH, 100 mg at 03/02/24 0934   enoxaparin (LOVENOX) injection 40 mg, 40 mg, Subcutaneous, Q24H, Rodolph Bong, MD, 40 mg at 03/01/24 2155   escitalopram (LEXAPRO) tablet 5 mg, 5 mg, Oral, Daily, Rodolph Bong, MD, 5 mg at 03/02/24 0934   famotidine (PEPCID) tablet 20 mg, 20 mg, Oral, QHS, Rodolph Bong, MD, 20 mg at 03/01/24 2155   gabapentin (NEURONTIN) capsule 100 mg, 100 mg, Oral, BID, Charm Rings, NP, 100 mg at 03/02/24 9604   Gerhardt's butt cream, , Topical, PRN, Rodolph Bong, MD, Given at 02/29/24 0150   levothyroxine (SYNTHROID) tablet 50 mcg, 50 mcg, Oral, QAC breakfast, Rodolph Bong, MD, 50 mcg at 03/02/24 5409   multivitamin with minerals tablet 1 tablet, 1 tablet, Oral, Q24H, Lord, Jamison Y, NP, 1 tablet at 03/01/24 1418   OLANZapine (ZYPREXA) tablet 2.5 mg, 2.5 mg, Oral, BID, Shaune Pollack, Jamison Y, NP, 2.5 mg at 03/02/24 8119   pantoprazole (PROTONIX) EC tablet 40 mg,  40 mg, Oral, Daily, Rodolph Bong, MD, 40 mg at 03/02/24 0934   polyethylene glycol (MIRALAX / GLYCOLAX) packet 17 g, 17 g, Oral, Daily PRN, Rodolph Bong, MD   rosuvastatin (CRESTOR) tablet 10 mg, 10 mg, Oral, Daily, Rodolph Bong, MD, 10 mg at 03/02/24 1478   senna-docusate (Senokot-S) tablet 1 tablet, 1 tablet, Oral, QHS PRN, Rodolph Bong, MD   sodium chloride flush (NS) 0.9 % injection 10-40 mL, 10-40 mL, Intracatheter, Q12H, Rodolph Bong, MD, 10 mL at 03/02/24 0934   sodium chloride flush (NS) 0.9 % injection 10-40 mL, 10-40 mL, Intracatheter, PRN, Rodolph Bong, MD   thiamine (VITAMIN B1) tablet 100 mg, 100 mg, Oral, Daily, Charm Rings, NP, 100 mg at 03/02/24 2956  Allergies: Allergies  Allergen Reactions   Amlodipine Swelling and Other (See Comments)    Leg edema and fluid retention   Atorvastatin Other (See Comments)    Other reaction(s): back ache   Bystolic [Nebivolol Hcl] Other (See Comments)    sweat and get clammy   Carbamazepine Other (See Comments)    rash   Mirtazapine Other (See Comments)    Other reaction(s): edema, vivid nightmares, patient was unsure    Penicillins Nausea And Vomiting   Sulfacetamide Nausea Only    Donnella Morford, NP

## 2024-03-02 NOTE — TOC Progression Note (Addendum)
 Transition of Care [facility]) - Progression Note    Patient Details  Name: Sharon Huffman MRN: [id number] Date of Birth: 03/02/1961  Transition of Care [facility] CM/SW Contact  Lorri Frederick, LCSW Phone Number: 03/02/2024, 4:00 PM  Clinical Narrative:   CSW informed pt medically stable for psych inpatient treatment.  Per Fredna Dow, no gero beds available at [facility]  Referral faxed to: [place]  Referral emailed to [name].   Expected Discharge Plan: Home/Self Care Barriers to Discharge: Continued Medical Work up  Expected Discharge Plan and Services   Discharge Planning Services: CM Consult   Living arrangements for the past 2 months: Single Family Home                                       Social Determinants of Health (SDOH) Interventions SDOH Screenings   Food Insecurity: No Food Insecurity (02/24/2024)  Housing: Low Risk  (02/24/2024)  Transportation Needs: No Transportation Needs (02/24/2024)  Utilities: Not At Risk (02/24/2024)  Social Connections: Moderately Isolated (02/24/2024)  Tobacco Use: Medium Risk (02/23/2024)    Readmission Risk Interventions     No data to display

## 2024-03-02 NOTE — Evaluation (Signed)
 Occupational Therapy Evaluation Patient Details Name: Sharon Huffman MRN: [id number] DOB: November 18, 1961 Today's Date: 03/02/2024   History of Present Illness   Pt is a 63 y.o. female admitted 02/23/24 via EMS for possible drug overdose. PMH: anxiety, bronchitis, cervical cancer, depression, hyperlipidemia, HTN, lyme disease, migraine, ovarian cancer.     Clinical Impressions Pt admitted based on above, and was seen based on problem list below. In the last 5 weeks per husband, pt has experienced a decline in ADL, physical, and mental status. He reports she does not like to bathe because it hurts her skin, and has required min-mod assist for ADLs. Prior to then she was I. Today pt is requiring set up to min assist for  ADLs. Bed mobility is CGA and functional transfers are min assist. RUE showing decreased shoulder ROM and pain with AROM. Pt was a & o x4 with decreased restlessness, showing an improvement from previous eval. OT will continue to follow acutely to maximize functional independence.      If plan is discharge home, recommend the following:   A little help with walking and/or transfers;A little help with bathing/dressing/bathroom;Assistance with cooking/housework;Direct supervision/assist for medications management;Direct supervision/assist for financial management;Supervision due to cognitive status     Functional Status Assessment   Patient has had a recent decline in their functional status and demonstrates the ability to make significant improvements in function in a reasonable and predictable amount of time.     Equipment Recommendations   BSC/3in1     Recommendations for Other Services         Precautions/Restrictions   Precautions Precautions: Fall Recall of Precautions/Restrictions: Impaired Precaution/Restrictions Comments: Recruitment consultant Restrictions [employer] Bearing Restrictions Per Provider Order: No     Mobility Bed Mobility Overal bed mobility: Needs  Assistance Bed Mobility: Supine to Sit, Sit to Supine     Supine to sit: Contact guard, HOB elevated, Used rails Sit to supine: Contact guard assist   General bed mobility comments: Relied on rails    Transfers Overall transfer level: Needs assistance Equipment used: Rolling walker (2 wheels) Transfers: Sit to/from Stand, Bed to chair/wheelchair/BSC Sit to Stand: Contact guard assist, From elevated surface     Step pivot transfers: Min assist     General transfer comment: CGA for STS, min assist to steady during ambulation      Balance Overall balance assessment: Needs assistance Sitting-balance support: Feet supported, Bilateral upper extremity supported, No upper extremity supported Sitting balance-Leahy Scale: Fair Sitting balance - Comments: Pt able to reach LEs while seated EOB with no LOB   Standing balance support: During functional activity, Reliant on assistive device for balance, Bilateral upper extremity supported Standing balance-Leahy Scale: Poor Standing balance comment: Reliant on RW                           ADL either performed or assessed with clinical judgement   ADL Overall ADL's : Needs assistance/impaired Eating/Feeding: Set up;Bed level   Grooming: Wash/dry hands;Minimal assistance;Standing Grooming Details (indicate cue type and reason): Min assist to steady     Lower Body Bathing: Contact guard assist;Sitting/lateral leans Lower Body Bathing Details (indicate cue type and reason): CGA to reach lower legs while seated Upper Body Dressing : Minimal assistance;Sitting   Lower Body Dressing: Minimal assistance;Sit to/from stand   Toilet Transfer: Moderate assistance;Ambulation;BSC/3in1;Rolling walker (2 wheels) Toilet Transfer Details (indicate cue type and reason): Steadying assist d/t L knee buckling Toileting- Clothing Manipulation  and Hygiene: Minimal assistance;Sit to/from stand Toileting - Clothing Manipulation Details  (indicate cue type and reason): Steadying assist while standing     Functional mobility during ADLs: Minimal assistance;Rolling walker (2 wheels)       Vision Baseline Vision/History: 0 No visual deficits Vision Assessment?: No apparent visual deficits     Perception         Praxis         Pertinent Vitals/Pain Pain Assessment Pain Assessment: Faces Faces Pain Scale: Hurts a little bit Pain Location: R shoulder Pain Descriptors / Indicators: Constant, Discomfort, Grimacing, Guarding Pain Intervention(s): Limited activity within patient's tolerance     Extremity/Trunk Assessment Upper Extremity Assessment Upper Extremity Assessment: Generalized weakness;Right hand dominant;RUE deficits/detail RUE Deficits / Details: Shoulder flex & abd at approx 90 degrees RUE: Shoulder pain with ROM RUE Coordination: decreased gross motor   Lower Extremity Assessment Lower Extremity Assessment: Generalized weakness;LLE deficits/detail LLE Deficits / Details: Pt's L knee buckles during mobillity   Cervical / Trunk Assessment Cervical / Trunk Assessment: Normal   Communication Communication Communication: No apparent difficulties Factors Affecting Communication: Difficulty expressing self (Tangiental disorganized thoughts)   Cognition Arousal: Alert, Suspect due to medications Behavior During Therapy: Restless, Impulsive Cognition: Cognition impaired     Awareness: Intellectual awareness intact, Online awareness impaired Memory impairment (select all impairments): Short-term memory, Working memory Attention impairment (select first level of impairment): Sustained attention Executive functioning impairment (select all impairments): Organization, Sequencing, Reasoning, Problem solving, Initiation OT - Cognition Comments: Per husband in last 5 weeks there has been psychological problems from grief                 Following commands: Intact       Cueing  General  Comments   Cueing Techniques: Verbal cues;Tactile cues;Visual cues  Pt had bowel movement, RN notified of void and need for dressing change   Exercises     Shoulder Instructions      Home Living Family/patient expects to be discharged to:: Private residence Living Arrangements: Spouse/significant other Available Help at Discharge: Available PRN/intermittently;Family (Spouse works from home and can assist) Type of Home: House Home Access: Level entry     Home Layout: Able to live on main level with bedroom/bathroom;One level;Full bath on main level     Bathroom Shower/Tub: Tub/shower unit;Walk-in shower   Bathroom Toilet: Standard Bathroom Accessibility: Yes How Accessible: Accessible via walker Home Equipment: Rolling Walker (2 wheels);Wheelchair - manual;Shower seat;Grab bars - tub/shower;Hand held shower head (Bed rail attachment)   Additional Comments: pt's spouse has been assisting for ~5 weeks with pt progressively needing greater assistance.      Prior Functioning/Environment Prior Level of Function : Needs assist             Mobility Comments: pt has been requiring increased assist, no device. ADLs Comments: Per husband last 5 weeks pt was experiencing a decline both physically and cog. Before then she was independent w/ ADLs    OT Problem List: Decreased strength;Decreased range of motion;Decreased activity tolerance;Impaired balance (sitting and/or standing);Decreased cognition;Decreased safety awareness;Other (comment) (Decreased mental status)   OT Treatment/Interventions: Self-care/ADL training;Therapeutic exercise;DME and/or AE instruction;Therapeutic activities;Patient/family education;Balance training      OT Goals(Current goals can be found in the care plan section)   Acute Rehab OT Goals Patient Stated Goal: None stated OT Goal Formulation: With patient Time For Goal Achievement: 03/12/24 Potential to Achieve Goals: Good   OT Frequency:  Min  2X/week    Co-evaluation  AM-PAC OT "6 Clicks" Daily Activity     Outcome Measure Help from another person eating meals?: A Little Help from another person taking care of personal grooming?: A Little Help from another person toileting, which includes using toliet, bedpan, or urinal?: A Little Help from another person bathing (including washing, rinsing, drying)?: A Little Help from another person to put on and taking off regular upper body clothing?: A Little Help from another person to put on and taking off regular lower body clothing?: A Little 6 Click Score: 18   End of Session Equipment Utilized During Treatment: Gait belt;Rolling walker (2 wheels) Nurse Communication: Mobility status  Activity Tolerance: Patient tolerated treatment well Patient left: in bed;with call bell/phone within reach;with bed alarm set;with nursing/sitter in room  OT Visit Diagnosis: Unsteadiness on feet (R26.81);Other abnormalities of gait and mobility (R26.89);Muscle weakness (generalized) (M62.81);Other symptoms and signs involving cognitive function                Time: 1344-1411 OT Time Calculation (min): 27 min Charges:  OT General Charges $OT Visit: 1 Visit OT Treatments $Self Care/Home Management : 23-37 mins  Ivor Messier, OT  Acute Rehabilitation Services Office [phone] Secure chat preferred   Marilynne Drivers 03/02/2024, 2:32 PM

## 2024-03-02 NOTE — Plan of Care (Signed)
   Problem: Education: Goal: Knowledge of General Education information will improve Description: Including pain rating scale, medication(s)/side effects and non-pharmacologic comfort measures Outcome: Not Progressing   Problem: Health Behavior/Discharge Planning: Goal: Ability to manage health-related needs will improve Outcome: Not Progressing   Problem: Clinical Measurements: Goal: Ability to maintain clinical measurements within normal limits will improve Outcome: Not Progressing Goal: Will remain free from infection Outcome: Not Progressing Goal: Diagnostic test results will improve Outcome: Not Progressing Goal: Respiratory complications will improve Outcome: Not Progressing Goal: Cardiovascular complication will be avoided Outcome: Not Progressing   Problem: Activity: Goal: Risk for activity intolerance will decrease Outcome: Not Progressing   Problem: Nutrition: Goal: Adequate nutrition will be maintained Outcome: Not Progressing   Problem: Coping: Goal: Level of anxiety will decrease Outcome: Not Progressing   Problem: Elimination: Goal: Will not experience complications related to bowel motility Outcome: Not Progressing Goal: Will not experience complications related to urinary retention Outcome: Not Progressing   Problem: Pain Managment: Goal: General experience of comfort will improve and/or be controlled Outcome: Not Progressing   Problem: Safety: Goal: Ability to remain free from injury will improve Outcome: Not Progressing   Problem: Skin Integrity: Goal: Risk for impaired skin integrity will decrease Outcome: Not Progressing   Problem: Safety: Goal: Non-violent Restraint(s) Outcome: Not Progressing

## 2024-03-02 NOTE — Plan of Care (Signed)
  Problem: Pain Managment: Goal: General experience of comfort will improve and/or be controlled Outcome: Progressing   Problem: Safety: Goal: Ability to remain free from injury will improve Outcome: Progressing   Problem: Skin Integrity: Goal: Risk for impaired skin integrity will decrease Outcome: Progressing

## 2024-03-02 NOTE — Progress Notes (Signed)
   03/02/24 1500  Urine Characteristics  Urine Color Yellow/straw  Urine Appearance Clear  Urinary Interventions Intermittent/Straight cath  Intermittent/Straight Cath (mL) 1300 mL  Intermittent Catheter Size 16  Hygiene Peri care   Bladder scan >963mL. Pt requested to try and void on her own before I&O cath. Pt had scant urine output. output from I&O cath. Ramiro Harvest, MD notified.

## 2024-03-02 NOTE — Progress Notes (Signed)
 Physical Therapy Treatment Patient Details Name: Sharon Huffman MRN: [id number] DOB: [birth year] Today's Date: 03/02/2024   History of Present Illness Pt is a 63 y.o. female admitted 02/23/24 via EMS for possible drug overdose. PMH: anxiety, bronchitis, cervical cancer, depression, hyperlipidemia, HTN, lyme disease, migraine, ovarian cancer.    PT Comments  Pt awake and speaking with husband upon PT arrival to room, states she is sore all over and somewhat foggy mentally but improving. Pt ambulatory for x2 room distances with RW, overall requiring light steadying assist as pt with tendency for posterior bias. Per pt and spouse, pt has a history of balance issues at baseline, but balance and mobility is currently worse than baseline. Pt progressing well, anticipate continued progress while acute.    If plan is discharge home, recommend the following: Assistance with cooking/housework;Direct supervision/assist for medications management;Assist for transportation;Help with stairs or ramp for entrance;Supervision due to cognitive status;A little help with walking and/or transfers;A little help with bathing/dressing/bathroom   Can travel by private vehicle        Equipment Recommendations  Rolling walker (2 wheels)    Recommendations for Other Services       Precautions / Restrictions Precautions Precautions: Fall Precaution/Restrictions Comments: Recruitment consultant Restrictions Weight Bearing Restrictions Per Provider Order: No     Mobility  Bed Mobility Overal bed mobility: Needs Assistance Bed Mobility: Supine to Sit, Sit to Supine     Supine to sit: Min assist Sit to supine: Min assist   General bed mobility comments: assist for trunk elevation, LE lift into bed. pt initiating most of mobility with cues    Transfers Overall transfer level: Needs assistance Equipment used: Rolling walker (2 wheels) Transfers: Sit to/from Stand Sit to Stand: Min assist           General transfer  comment: assist for power up, steadying as pt with min posterior bias. stand x2, from EOB and toilet    Ambulation/Gait Ambulation/Gait assistance: Min assist Gait Distance (Feet): 20 Feet (x2 - to and from toilet) Assistive device: Rolling walker (2 wheels) Gait Pattern/deviations: Step-through pattern, Decreased stride length, Trunk flexed Gait velocity: decr     General Gait Details: assist to steady and correct posterior bias   Stairs             Wheelchair Mobility     Tilt Bed    Modified Rankin (Stroke Patients Only)       Balance Overall balance assessment: Needs assistance Sitting-balance support: Feet supported, Bilateral upper extremity supported, No upper extremity supported Sitting balance-Leahy Scale: Fair     Standing balance support: During functional activity, Reliant on assistive device for balance, Bilateral upper extremity supported Standing balance-Leahy Scale: Poor                              Communication Communication Communication: No apparent difficulties Factors Affecting Communication: Difficulty expressing self (tangential, disorganized in thought)  Cognition Arousal: Alert, Suspect due to medications Behavior During Therapy: Restless, Impulsive   PT - Cognitive impairments: Awareness, Memory, Attention, Initiation, Sequencing, Problem solving, Safety/Judgement                         Following commands: Intact      Cueing Cueing Techniques: Verbal cues, Tactile cues, Visual cues  Exercises      General Comments General comments (skin integrity, edema, etc.): diarrhea on toilet, RN aware. buttocks is  red, pt applied barrier cream and sacral pad donned throughout session      Pertinent Vitals/Pain Pain Assessment Pain Assessment: Faces Faces Pain Scale: Hurts little more Pain Location: R shoulder Pain Descriptors / Indicators: Constant, Discomfort, Grimacing, Guarding Pain Intervention(s): Limited  activity within patient's tolerance, Monitored during session, Repositioned    Home Living                          Prior Function            PT Goals (current goals can now be found in the care plan section) Acute Rehab PT Goals PT Goal Formulation: With patient Time For Goal Achievement: 03/12/24 Potential to Achieve Goals: Fair Progress towards PT goals: Progressing toward goals    Frequency    Min 2X/week      PT Plan      Co-evaluation              AM-PAC PT "6 Clicks" Mobility   Outcome Measure  Help needed turning from your back to your side while in a flat bed without using bedrails?: A Little Help needed moving from lying on your back to sitting on the side of a flat bed without using bedrails?: A Little Help needed moving to and from a bed to a chair (including a wheelchair)?: A Little Help needed standing up from a chair using your arms (e.g., wheelchair or bedside chair)?: A Little Help needed to walk in hospital room?: A Little Help needed climbing 3-5 steps with a railing? : A Lot 6 Click Score: 17    End of Session   Activity Tolerance: Patient tolerated treatment well Patient left: with call bell/phone within reach;with family/visitor present;in bed;with bed alarm set;with nursing/sitter in room Nurse Communication: Mobility status PT Visit Diagnosis: Other abnormalities of gait and mobility (R26.89);Muscle weakness (generalized) (M62.81);Difficulty in walking, not elsewhere classified (R26.2)     Time: 1610-9604 PT Time Calculation (min) (ACUTE ONLY): 33 min  Charges:    $Gait Training: 8-22 mins $Therapeutic Activity: 8-22 mins PT General Charges $$ ACUTE PT VISIT: 1 Visit                     Marye Round, PT DPT Acute Rehabilitation Services Secure Chat Preferred  Office [phone]    Willi Borowiak E Christain [place] 03/02/2024, 11:46 AM

## 2024-03-02 NOTE — Progress Notes (Signed)
 PROGRESS NOTE    Sharon Huffman  EXB:[id number] DOB: 12-28-1960 DOA: 02/23/2024 PCP: Merri Brunette, MD    Chief Complaint  Patient presents with   Drug Overdose    Brief Narrative:  Patient is a 63 year old female past medical history significant for anxiety, depression, hyperlipidemia, hypertension, hypothyroidism, Lyme's disease, migraine headache and ADHD.  Patient was found down at home (unresponsive) by her husband.  Patient had agonal breathing, with O2 sat of 70% on nonrebreather.  Patient was administered IM Narcan 2 Mg and Glasgow Coma Scale improved from 4-13.  O2 sat improved to 90% on nonrebreather.  Patient lost her mother recently.  Patient is known to have access to morphine, Xanax and Norco.  There are concerns that the patient may have taken the medications.  Psychiatric team has been consulted.   02/24/2024: Patient seen alongside patient's husband.  Patient is awake and alert, patient remains drowsy.  Patient is not able to give significant history.  Patient vomiting reported.  Chest x-ray concerning for possible aspiration/multifocal pneumonia.  Patient is currently on IV Unasyn.   02/25/2024: Patient is awake, but remains drowsy.  Not able to give coherent history.   Assessment & Plan:   Principal Problem:   Overdose Active Problems:   Depression   Hypophosphatemia   Hyperkalemia   Hypomagnesemia   ADHD   Aspiration pneumonia (HCC)   Nausea and vomiting   Dehydration   AKI (acute kidney injury) (HCC)   Hyperlipidemia   Hypothyroidism   Major depressive disorder, recurrent severe without psychotic features (HCC)   Acute urinary retention   Hypokalemia  #1 drug overdose -Likely intentional overdose. -Patient recently noted to have lost her mother who died in her arms per patient and since then patient has felt depressed and etiology of why she took medications however cannot quite tell me what specific medications and how much she took. -Patient currently  denying any suicidal ideation or homicidal ideation at this time. -Drug overdose complicated by aspiration and patient on antibiotics. -Continue one-to-one sitter. -Patient tried restraints during the hospitalization secondary to agitation and auditory hallucinations.   -Restraints removed on 03/01/2024 and patient less agitated and denying any hallucinations.  -Status post IVC. -Psychiatry consulted who have assessed patient, adjustments to medications and recommendations made as noted in problem #9. -Psychiatry recommending inpatient psychiatric hospitalization once patient is medically stable.  2.  Probable aspiration pneumonia -Currently on IV Unasyn and doxycycline.   -Tolerating dysphagia 2 diet and advance to a dysphagia 3 diet which patient is tolerating.   -Continue doxycycline.   -Discontinue IV Unasyn and placed on Augmentin for 2 more days to complete a 7-day course of antibiotic treatment. -SLP following.  3.  AKI -Secondary to prerenal azotemia in the setting of spironolactone and Lasix as needed. -Patient clinically dry on examination early on in the hospitalization. -Urinalysis nitrite negative, leukocytes negative, negative for protein, 0-5 WBCs, -Renal function improved with hydration.   -IV fluids have been discontinued.  4.  Dehydration -Saline lock IV fluids.  5.  Hyperlipidemia -Continue statin.   6.  Mild hyperkalemia -Likely secondary to AKI. -Resolved.  7.  Hypophosphatemia/hypomagnesemia -Phosphorus at 4.   -Magnesium at 2.2 -Status post 3 days K-Phos.  -Repeat labs in the AM.  8.  Hypertension -BP currently stable. -Continue to hold antihypertensive medications.  9.  ADHD/depression/anxiety/agitation/delirium -Patient noted to be hallucinating per RN and NT. -Patient also noted with bouts of confusion and auditory hallucinations, states she has been speaking with  her mother who recently passed on 26-Mar-2024. -Patient also with bouts of agitation  and yelling. -Patient with clinical improvement, denies any further auditory hallucinations. -Agitation seems to have improved. -Patient started on Ativan 0.5 mg 3 times daily per psychiatry to avoid withdrawal as patient noted to be on Xanax as needed prior to admission. -Patient seen in consultation by psychiatry and scheduled Ativan and as needed Ativan discontinued. -Patient started on Xanax 0.5 mg twice daily as well as gabapentin 100 mg twice daily per psychiatry. -Seroquel discontinued. -Patient started on Zyprexa 2.5 mg twice daily per psychiatry. -Home regimen Prozac discontinued and patient started on Lexapro 5 mg daily per psychiatric recommendations. -Restraints were removed yesterday and patient not agitated. -Continue delirium precautions. -Per psychiatry recommended inpatient psychiatric hospitalization once patient medically stable.  10.  Nausea and vomiting -Clinical improvement.   -No further nausea or vomiting. -Status post Dulcolax suppository x 1.   -Laxatives changed to as needed as patient was starting to have diarrhea. -Supportive care.  11.  Migraine headaches -Stable.  12.  Hypothyroidism -Continue Synthroid.   13.  Hypokalemia -Secondary to GI losses as patient noted to have multiple watery loose stools per RN and sitter on 02/27/2024.   -Laxatives changed to as needed.   -Potassium repleted currently at 3.8 this morning.  -Repeat labs in the AM.  14.  Diarrhea -Patient with multiple watery loose stools on laxatives. -Diarrhea improved. -Laxatives discontinued and changed to as needed.  15.  Urinary retention -Per RN patient with urinary retention this morning and had an INO cath. -Bladder scan this afternoon with > 999cc. -I and O cath with 1.3 L out. -Check bladder scan every 6-8 hours and if patient still with significant urinary retention we will place a Foley catheter.     DVT prophylaxis: Heparin>>> Lovenox Code Status: Full Family  Communication: No family at bedside. Disposition: Inpatient psychiatry.  Status is: Inpatient Remains inpatient appropriate because: Severity of illness   Consultants:  Psychiatry pending  Procedures:  CT head 02/23/2024 Chest x-ray 02/23/2024, 02/24/2024 MRI L-spine 02/24/2024 MRI brain 02/24/2024  Antimicrobials:  Anti-infectives (From admission, onward)    Start     Dose/Rate Route Frequency Ordered Stop   03/02/24 1400  amoxicillin-clavulanate (AUGMENTIN) 875-125 MG per tablet 1 tablet        1 tablet Oral Every 12 hours 03/02/24 1302 03/04/24 0959   02/25/24 2200  doxycycline (VIBRA-TABS) tablet 100 mg        100 mg Oral Every 12 hours 02/25/24 1042     02/25/24 1800  Ampicillin-Sulbactam (UNASYN) 3 g in sodium chloride 0.9 % 100 mL IVPB  Status:  Discontinued        3 g 200 mL/hr over 30 Minutes Intravenous Every 8 hours 02/25/24 1539 03/02/24 1302   02/24/24 1000  Ampicillin-Sulbactam (UNASYN) 3 g in sodium chloride 0.9 % 100 mL IVPB  Status:  Discontinued        3 g 200 mL/hr over 30 Minutes Intravenous Every 12 hours 02/24/24 0127 02/25/24 1539   02/23/24 2000  cefTRIAXone (ROCEPHIN) 2 g in sodium chloride 0.9 % 100 mL IVPB        2 g 200 mL/hr over 30 Minutes Intravenous Once 02/23/24 1947 02/23/24 2059   02/23/24 2000  doxycycline (VIBRAMYCIN) 100 mg in sodium chloride 0.9 % 250 mL IVPB  Status:  Discontinued        100 mg 125 mL/hr over 120 Minutes Intravenous Every 12 hours 02/23/24 1947  02/25/24 1042         Subjective: Laying in bed without restraints on.  Sitter at bedside states patient with no agitation today.  Patient alert and oriented to self and time.  Unsure of where she is.  Patient with some intermittent bouts of confusion.  Patient denies any auditory or visual hallucinations.  Denies any suicidal or homicidal ideations.  Tolerated dysphagia 3 diet.  Objective: Vitals:   03/01/24 1545 03/01/24 1900 03/02/24 0602 03/02/24 1052  BP: (!) 112/91 123/86 (!)  152/96 132/67  Pulse: (!) 109 (!) 101 92 90  Resp: 18 19 18 18   Temp: 99.2 F (37.3 C) 98.9 F (37.2 C) 98.6 F (37 C) 97.8 F (36.6 C)  TempSrc: Oral Oral Oral Oral  SpO2: 99% 99% 99% 96%  Weight:      Height:        Intake/Output Summary (Last 24 hours) at 03/02/2024 1531 Last data filed at 03/02/2024 1500 Gross per 24 hour  Intake 1020 ml  Output 2700 ml  Net -1680 ml   Filed Weights   02/23/24 1927  Weight: 65.3 kg    Examination:  General exam: NAD.  Dry mucous membranes. Respiratory system: Lungs clear to auscultation bilaterally.  No wheezes, no crackles, no rhonchi.  Fair air movement.  Speaking in full sentences.   Cardiovascular system: RRR no murmurs rubs or gallops.  No JVD.  No pitting lower extremity edema.   Gastrointestinal system: Abdomen is soft, nontender, nondistended, positive bowel sounds.  No rebound.  No guarding.  Central nervous system: Alert and oriented.  Moving extremities spontaneously.  No focal neurological deficits. Extremities: Symmetric 5 x 5 power. Skin: No rashes, lesions or ulcers Psychiatry: Judgement and insight appear poor. Mood & affect depressed.     Data Reviewed: I have personally reviewed following labs and imaging studies  CBC: Recent Labs  Lab 02/25/24 0640 02/26/24 0542 02/27/24 0339 02/28/24 0324 02/29/24 0343 03/01/24 0134  WBC 10.0 11.4* 8.4 7.3 7.0 7.6  NEUTROABS 8.8* 10.5*  --   --   --   --   HGB 10.5* 11.9* 10.5* 10.2* 10.3* 10.9*  HCT 30.7* 35.7* 30.7* 30.8* 31.3* 32.5*  MCV 94.8 96.0 93.9 96.3 96.3 96.2  PLT 178 201 201 205 228 279    Basic Metabolic Panel: Recent Labs  Lab 02/25/24 0640 02/26/24 0542 02/27/24 0339 02/27/24 2004 02/28/24 0324 02/29/24 0343 03/01/24 0134  NA 141 140 139 143 142 141 145  K 3.7 3.5 2.8* 3.5 3.6 3.5 3.8  CL 107 104 109 114* 114* 111 111  CO2 24 20* 19* 20* 19* 19* 22  GLUCOSE 94 91 92 94 106* 98 127*  BUN 15 11 9  6* 5* 7* 10  CREATININE 1.18* 1.21* 0.91 1.00  1.02* 1.05* 1.15*  CALCIUM 9.2 9.6 8.4* 8.5* 8.6* 8.8* 9.0  MG 1.4* 1.8 1.9  --  1.8 1.7 2.2  PHOS 1.7* 2.6 2.9  --  2.2*  --  4.0    GFR: Estimated Creatinine Clearance: 44.5 mL/min (A) (by C-G formula based on SCr of 1.15 mg/dL (H)).  Liver Function Tests: Recent Labs  Lab 02/25/24 0640 02/26/24 0542 02/27/24 0339 02/28/24 0324 03/01/24 0134  ALBUMIN 3.0* 3.5 2.8* 2.8* 2.8*    CBG: No results for input(s): "GLUCAP" in the last 168 hours.   Recent Results (from the past 240 hours)  Resp panel by RT-PCR (RSV, Flu A&B, Covid) Anterior Nasal Swab     Status: None  Collection Time: 02/23/24  8:10 PM   Specimen: Anterior Nasal Swab  Result Value Ref Range Status   SARS Coronavirus 2 by RT PCR NEGATIVE NEGATIVE Final   Influenza A by PCR NEGATIVE NEGATIVE Final   Influenza B by PCR NEGATIVE NEGATIVE Final    Comment: (NOTE) The Xpert Xpress SARS-CoV-2/FLU/RSV plus assay is intended as an aid in the diagnosis of influenza from Nasopharyngeal swab specimens and should not be used as a sole basis for treatment. Nasal washings and aspirates are unacceptable for Xpert Xpress SARS-CoV-2/FLU/RSV testing.  Fact Sheet for Patients: BloggerCourse.com  Fact Sheet for Healthcare Providers: SeriousBroker.it  This test is not yet approved or cleared by the [place] FDA and has been authorized for detection and/or diagnosis of SARS-CoV-2 by FDA under an Emergency Use Authorization (EUA). This EUA will remain in effect (meaning this test can be used) for the duration of the COVID-19 declaration under Section 564(b)(1) of the Act, 21 U.S.C. section 360bbb-3(b)(1), unless the authorization is terminated or revoked.     Resp Syncytial Virus by PCR NEGATIVE NEGATIVE Final    Comment: (NOTE) Fact Sheet for Patients: BloggerCourse.com  Fact Sheet for Healthcare  Providers: SeriousBroker.it  This test is not yet approved or cleared by the [place] FDA and has been authorized for detection and/or diagnosis of SARS-CoV-2 by FDA under an Emergency Use Authorization (EUA). This EUA will remain in effect (meaning this test can be used) for the duration of the COVID-19 declaration under Section 564(b)(1) of the Act, 21 U.S.C. section 360bbb-3(b)(1), unless the authorization is terminated or revoked.  Performed at [facility], [address], [place], [place] [address]   Blood Culture (routine x 2)     Status: None   Collection Time: 02/23/24  8:10 PM   Specimen: BLOOD  Result Value Ref Range Status   Specimen Description BLOOD RIGHT ANTECUBITAL  Final   Special Requests   Final    BOTTLES DRAWN AEROBIC AND ANAEROBIC Blood Culture results may not be optimal due to an inadequate volume of blood received in culture bottles   Culture   Final    NO GROWTH 5 DAYS Performed at [facility], [address], [place], [place] [address]    Report Status 02/28/2024 FINAL  Final         Radiology Studies: No results found.       Scheduled Meds:  ALPRAZolam  0.5 mg Oral BID   amoxicillin-clavulanate  1 tablet Oral Q12H   aspirin EC  81 mg Oral Daily   doxycycline  100 mg Oral Q12H   enoxaparin (LOVENOX) injection  40 mg Subcutaneous Q24H   escitalopram  5 mg Oral Daily   famotidine  20 mg Oral QHS   gabapentin  100 mg Oral BID   levothyroxine  50 mcg Oral QAC breakfast   multivitamin with minerals  1 tablet Oral Q24H   OLANZapine  2.5 mg Oral BID   pantoprazole  40 mg Oral Daily   rosuvastatin  10 mg Oral Daily   sodium chloride flush  10-40 mL Intracatheter Q12H   thiamine  100 mg Oral Daily   Continuous Infusions:     LOS: 7 days    Time spent: 40 minutes    Ramiro Harvest, MD [facility]   To contact the attending provider between 7A-7P or the covering provider during  after hours 7P-7A, please log into the web site [email or web address] and access using universal [place]  password for that web site. If you do not have the password, please call the hospital operator.  03/02/2024, 3:31 PM

## 2024-03-03 DIAGNOSIS — F909 Attention-deficit hyperactivity disorder, unspecified type: Secondary | ICD-10-CM | POA: Diagnosis not present

## 2024-03-03 DIAGNOSIS — T50902D Poisoning by unspecified drugs, medicaments and biological substances, intentional self-harm, subsequent encounter: Secondary | ICD-10-CM | POA: Diagnosis not present

## 2024-03-03 DIAGNOSIS — G894 Chronic pain syndrome: Secondary | ICD-10-CM

## 2024-03-03 DIAGNOSIS — F32A Depression, unspecified: Secondary | ICD-10-CM | POA: Diagnosis not present

## 2024-03-03 DIAGNOSIS — E039 Hypothyroidism, unspecified: Secondary | ICD-10-CM | POA: Diagnosis not present

## 2024-03-03 DIAGNOSIS — G8929 Other chronic pain: Secondary | ICD-10-CM | POA: Insufficient documentation

## 2024-03-03 LAB — CBC
HCT: 33.7 % — ABNORMAL LOW (ref 36.0–46.0)
Hemoglobin: 10.9 g/dL — ABNORMAL LOW (ref 12.0–15.0)
MCH: 31.5 pg (ref 26.0–34.0)
MCHC: 32.3 g/dL (ref 30.0–36.0)
MCV: 97.4 fL (ref 80.0–100.0)
Platelets: 321 10*3/uL (ref 150–400)
RBC: 3.46 MIL/uL — ABNORMAL LOW (ref 3.87–5.11)
RDW: 12.5 % (ref 11.5–15.5)
WBC: 7.6 10*3/uL (ref 4.0–10.5)
nRBC: 0 % (ref 0.0–0.2)

## 2024-03-03 LAB — RENAL FUNCTION PANEL
Albumin: 2.9 g/dL — ABNORMAL LOW (ref 3.5–5.0)
Anion gap: 13 (ref 5–15)
BUN: 10 mg/dL (ref 8–23)
CO2: 22 mmol/L (ref 22–32)
Calcium: 9 mg/dL (ref 8.9–10.3)
Chloride: 110 mmol/L (ref 98–111)
Creatinine, Ser: 1.17 mg/dL — ABNORMAL HIGH (ref 0.44–1.00)
GFR, Estimated: 52 mL/min — ABNORMAL LOW (ref >60)
Glucose, Bld: 83 mg/dL (ref 70–99)
Phosphorus: 3.2 mg/dL (ref 2.5–4.6)
Potassium: 3.2 mmol/L — ABNORMAL LOW (ref 3.5–5.1)
Sodium: 145 mmol/L (ref 135–145)

## 2024-03-03 LAB — MAGNESIUM: Magnesium: 1.8 mg/dL (ref 1.7–2.4)

## 2024-03-03 MED ORDER — MORPHINE SULFATE ER 15 MG PO TBCR
15.0000 mg | EXTENDED_RELEASE_TABLET | Freq: Two times a day (BID) | ORAL | Status: DC
  Administered 2024-03-03 – 2024-03-10 (×14): 15 mg via ORAL
  Filled 2024-03-03 (×14): qty 1

## 2024-03-03 MED ORDER — OXYCODONE HCL 5 MG PO TABS
5.0000 mg | ORAL_TABLET | Freq: Three times a day (TID) | ORAL | Status: DC | PRN
  Administered 2024-03-04 – 2024-03-10 (×14): 5 mg via ORAL
  Filled 2024-03-03 (×14): qty 1

## 2024-03-03 MED ORDER — ACETAMINOPHEN 325 MG PO TABS
650.0000 mg | ORAL_TABLET | Freq: Once | ORAL | Status: AC
  Administered 2024-03-03: 650 mg via ORAL
  Filled 2024-03-03: qty 2

## 2024-03-03 MED ORDER — POTASSIUM CHLORIDE CRYS ER 10 MEQ PO TBCR
40.0000 meq | EXTENDED_RELEASE_TABLET | ORAL | Status: AC
  Administered 2024-03-03 (×2): 40 meq via ORAL
  Filled 2024-03-03 (×2): qty 4

## 2024-03-03 MED ORDER — CHLORHEXIDINE GLUCONATE CLOTH 2 % EX PADS
6.0000 | MEDICATED_PAD | Freq: Every day | CUTANEOUS | Status: DC
  Administered 2024-03-04 – 2024-03-08 (×5): 6 via TOPICAL

## 2024-03-03 NOTE — Progress Notes (Signed)
 Physical Therapy Treatment Patient Details Name: Sharon Huffman MRN: [id number] DOB: 16-Sep-1961 Today's Date: 03/03/2024   History of Present Illness Pt is a 63 y.o. female admitted 02/23/24 via EMS for possible drug overdose. PMH: anxiety, bronchitis, cervical cancer, depression, hyperlipidemia, HTN, lyme disease, migraine, ovarian cancer.    PT Comments  Pt endorses feeling less "foggy" today, better response time to PT and is oriented throughout session. Pt ambulatory for 250+ ft with use of RW and supervision for safety, anticipate continued good progress with mobility and hopeful to progress pt off of RW given independent baseline. PT to continue to follow while acute.     If plan is discharge home, recommend the following: Assistance with cooking/housework;Direct supervision/assist for medications management;Assist for transportation;Help with stairs or ramp for entrance;Supervision due to cognitive status;A little help with walking and/or transfers;A little help with bathing/dressing/bathroom   Can travel by private vehicle     No  Equipment Recommendations  Rolling walker (2 wheels)    Recommendations for Other Services       Precautions / Restrictions Precautions Precautions: Fall Recall of Precautions/Restrictions: Impaired Precaution/Restrictions Comments: Recruitment consultant Restrictions Weight Bearing Restrictions Per Provider Order: No     Mobility  Bed Mobility               General bed mobility comments: up in bathroom upon arrival to room, left up in chair upon PT exit    Transfers Overall transfer level: Needs assistance Equipment used: Rolling walker (2 wheels) Transfers: Sit to/from Stand, Bed to chair/wheelchair/BSC Sit to Stand: Supervision           General transfer comment: for safety, slow to rise and sit but no physical assist    Ambulation/Gait Ambulation/Gait assistance: Supervision Gait Distance (Feet): 250 Feet Assistive device: Rolling  walker (2 wheels) Gait Pattern/deviations: Step-through pattern, Decreased stride length, Trunk flexed Gait velocity: decr     General Gait Details: for safety, cues for safe RW use. no overt LOB or posterior bias this date   Stairs             Wheelchair Mobility     Tilt Bed    Modified Rankin (Stroke Patients Only)       Balance Overall balance assessment: Needs assistance Sitting-balance support: Feet supported, Bilateral upper extremity supported, No upper extremity supported Sitting balance-Leahy Scale: Fair     Standing balance support: During functional activity, No upper extremity supported Standing balance-Leahy Scale: Fair Standing balance comment: can stand statically without AD                            Communication Communication Communication: No apparent difficulties Factors Affecting Communication: Difficulty expressing self (Tangiental disorganized thoughts)  Cognition Arousal: Alert Behavior During Therapy: WFL for tasks assessed/performed   PT - Cognitive impairments: Attention, Difficult to assess, Problem solving, Safety/Judgement                       PT - Cognition Comments: Pt oriented to self, location, situation, but states "I don't even know who I am". Pt states she feels less "foggy" today Following commands: Intact      Cueing Cueing Techniques: Verbal cues, Tactile cues, Visual cues  Exercises Other Exercises Other Exercises: sit<>stand x10, without UE support or physical assist    General Comments        Pertinent Vitals/Pain Pain Assessment Pain Assessment: Faces Faces Pain Scale: Hurts  little more Pain Location: aches and pains Pain Descriptors / Indicators: Discomfort Pain Intervention(s): Limited activity within patient's tolerance, Monitored during session, Repositioned    Home Living                          Prior Function            PT Goals (current goals can now be found  in the care plan section) Acute Rehab PT Goals PT Goal Formulation: With patient Time For Goal Achievement: 03/12/24 Potential to Achieve Goals: Fair Progress towards PT goals: Progressing toward goals    Frequency           PT Plan      Co-evaluation              AM-PAC PT "6 Clicks" Mobility   Outcome Measure  Help needed turning from your back to your side while in a flat bed without using bedrails?: A Little Help needed moving from lying on your back to sitting on the side of a flat bed without using bedrails?: A Little Help needed moving to and from a bed to a chair (including a wheelchair)?: A Little Help needed standing up from a chair using your arms (e.g., wheelchair or bedside chair)?: A Little Help needed to walk in hospital room?: A Little Help needed climbing 3-5 steps with a railing? : A Lot 6 Click Score: 17    End of Session   Activity Tolerance: Patient tolerated treatment well Patient left: in chair;with call bell/phone within reach;with nursing/sitter in room Nurse Communication: Mobility status PT Visit Diagnosis: Other abnormalities of gait and mobility (R26.89);Muscle weakness (generalized) (M62.81);Difficulty in walking, not elsewhere classified (R26.2)     Time: 5284-1324 PT Time Calculation (min) (ACUTE ONLY): 18 min  Charges:    $Gait Training: 8-22 mins PT General Charges $$ ACUTE PT VISIT: 1 Visit                     Marye Round, PT DPT Acute Rehabilitation Services Secure Chat Preferred  Office [phone]    Cheyrl Buley [name] 03/03/2024, 3:39 PM

## 2024-03-03 NOTE — Progress Notes (Signed)
 Speech Language Pathology Treatment: Dysphagia  Patient Details Name: Sharon Huffman MRN: [id number] DOB: 01/27/1961 Today's Date: 03/03/2024 Time: 7829-5621 SLP Time Calculation (min) (ACUTE ONLY): 17 min  Assessment / Plan / Recommendation Clinical Impression  Pt's mentation continues to improve, and as a result, diet had been advanced by MD to mechanical soft since last SLP visit. Pt reports that she likes this diet as it is similar to how she would take her food at home. Husband not present to confirm. Pt does not have overt signs of dysphagia during PO trials with SLP, but she does need Mod cues for sustained attention to engage in PO intake. Concern may be more for adequate intake as opposed to aspiration with current mentation. For now, will leave on mechanical soft diet per her preference but will f/u to see if she can progress any further.    HPI HPI: Pt is a 63 y.o. female admitted 02/23/24 via EMS for possible drug overdose. MRI negative. CXR concerning for PNA. PMH: anxiety, bronchitis, cervical cancer, depression, hyperlipidemia, HTN, lyme disease, migraine, ovarian cancer      SLP Plan  Continue with current plan of care      Recommendations for follow up therapy are one component of a multi-disciplinary discharge planning process, led by the attending physician.  Recommendations may be updated based on patient status, additional functional criteria and insurance authorization.    Recommendations  Diet recommendations: Dysphagia 3 (mechanical soft);Thin liquid Liquids provided via: Cup;Straw Medication Administration: Whole meds with puree Supervision: Staff to assist with self feeding;Full supervision/cueing for compensatory strategies Compensations: Minimize environmental distractions;Slow rate;Small sips/bites Postural Changes and/or Swallow Maneuvers: Seated upright 90 degrees;Upright 30-60 min after meal                  Oral care BID     Dysphagia, unspecified  (R13.10)     Continue with current plan of care     Mahala Menghini., M.A. CCC-SLP Acute Rehabilitation Services Office [phone]  Secure chat preferred   03/03/2024, 10:56 AM

## 2024-03-03 NOTE — Progress Notes (Signed)
 PROGRESS NOTE    Sharon Huffman  NWG:[id number] DOB: 05/03/1961 DOA: 02/23/2024 PCP: Merri Brunette, MD    Chief Complaint  Patient presents with   Drug Overdose    Brief Narrative:  Patient is a 63 year old female past medical history significant for anxiety, depression, hyperlipidemia, hypertension, hypothyroidism, Lyme's disease, migraine headache and ADHD.  Patient was found down at home (unresponsive) by her husband.  Patient had agonal breathing, with O2 sat of 70% on nonrebreather.  Patient was administered IM Narcan 2 Mg and Glasgow Coma Scale improved from 4-13.  O2 sat improved to 90% on nonrebreather.  Patient lost her mother recently.  Patient is known to have access to morphine, Xanax and Norco.  There are concerns that the patient may have taken the medications.  Psychiatric team has been consulted.   02/24/2024: Patient seen alongside patient's husband.  Patient is awake and alert, patient remains drowsy.  Patient is not able to give significant history.  Patient vomiting reported.  Chest x-ray concerning for possible aspiration/multifocal pneumonia.  Patient is currently on IV Unasyn.   02/25/2024: Patient is awake, but remains drowsy.  Not able to give coherent history.   Assessment & Plan:   Principal Problem:   Overdose Active Problems:   Depression   Hypophosphatemia   Hyperkalemia   Hypomagnesemia   ADHD   Aspiration pneumonia (HCC)   Nausea and vomiting   Dehydration   AKI (acute kidney injury) (HCC)   Hyperlipidemia   Hypothyroidism   Major depressive disorder, recurrent severe without psychotic features (HCC)   Acute urinary retention   Hypokalemia   Chronic pain  #1 drug overdose -Likely intentional overdose. -Patient recently noted to have lost her mother who died in her arms per patient and since then patient has felt depressed and etiology of why she took medications however cannot quite tell me what specific medications and how much she took. -Patient  currently denying any suicidal ideation or homicidal ideation at this time. -Drug overdose complicated by aspiration and patient on antibiotics. -Continue one-to-one sitter. -Patient tried restraints during the hospitalization secondary to agitation and auditory hallucinations.   -Restraints removed on 03/01/2024 and patient less agitated and denying any hallucinations.  -Status post IVC. -Psychiatry consulted who have assessed patient, adjustments to medications and recommendations made as noted in problem #9. -Psychiatry recommending inpatient psychiatric hospitalization once patient is medically stable.  2.  Probable aspiration pneumonia -On presentation patient placed initially on IV Unasyn and doxycycline.  -Was initially on a dysphagia 2 diet and diet advanced to a dysphagia 3 diet which patient is tolerating.     -Continue doxycycline and discontinue tomorrow..   -Was on IV Unasyn and transition to Augmentin to complete a 7-day course of antibiotic treatment. -SLP following.  3.  AKI -Secondary to prerenal azotemia in the setting of spironolactone and Lasix as needed. -Patient clinically dry on examination early on in the hospitalization. -Urinalysis nitrite negative, leukocytes negative, negative for protein, 0-5 WBCs, -Renal function improved with hydration.   -IV fluids have been discontinued.  4.  Dehydration -Saline lock IV fluids.  5.  Hyperlipidemia -Statin.  6.  Mild hyperkalemia -Likely secondary to AKI. -Resolved.  7.  Hypophosphatemia/hypomagnesemia -Phosphorus at 3.2. -Magnesium at 1.8. -Status post 3 days K-Phos.  -Repeat labs in the AM.  8.  Hypertension -BP currently stable. -Continue to hold antihypertensive medications.  9.  ADHD/depression/anxiety/agitation/delirium -Patient noted to be hallucinating per RN and NT. -Patient also noted with bouts of confusion  and auditory hallucinations, states she has been speaking with her mother who recently  passed on 03/15/24. -Patient also with bouts of agitation and yelling. -Patient with clinical improvement, denies any further auditory hallucinations. -Agitation seems to have improved. -Patient started on Ativan 0.5 mg 3 times daily per psychiatry to avoid withdrawal as patient noted to be on Xanax as needed prior to admission. -Patient seen in consultation by psychiatry and scheduled Ativan and as needed Ativan discontinued. -Patient started on Xanax 0.5 mg twice daily as well as gabapentin 100 mg twice daily per psychiatry. -Seroquel discontinued. -Patient started on Zyprexa 2.5 mg twice daily per psychiatry. -Home regimen Prozac discontinued and patient started on Lexapro 5 mg daily per psychiatric recommendations. -Restraints were removed and have been off for the past 48 hours.  -Continue delirium precautions. -Per psychiatry recommended inpatient psychiatric hospitalization once patient medically stable.  10.  Nausea and vomiting -Clinical improvement.   -No further nausea or vomiting. -Status post Dulcolax suppository x 1.   -Laxatives changed to as needed as patient was starting to have diarrhea. -Supportive care.  11.  Migraine headaches -Stable.  12.  Hypothyroidism -Synthroid.   13.  Hypokalemia -Secondary to GI losses as patient noted to have multiple watery loose stools per RN and sitter on 02/27/2024.   -Laxatives changed to as needed.   -Potassium at 3.2 this morning.   -Magnesium noted at 1.8. -K-Lor 40 mEq p.o. every 4 hours x 2 doses. -Repeat labs in the AM.  14.  Diarrhea -Patient with multiple watery loose stools on laxatives. -Diarrhea improved. -Laxatives discontinued and changed to as needed.  15.  Urinary retention -Patient noted with urinary retention underwent I and O cath x 3. -Patient with continued urinary retention overnight and Foley catheter placed.   -Voiding trial in 3 to 5 days.  16.  Chronic pain, POA -Patient noted with a history of  chronic pain, on MS Contin 15 mg twice daily as well as Norco 09/18/2024-tablet twice daily in addition to Celebrex prior to admission. -Pain medications held on admission as patient had presented with a drug overdose and had noted to be drowsy early on during the hospitalization. -Mental status improved after starting psych medications. -Will resume home regimen MS Contin 15 mg twice daily. -Oxycodone 5 mg every 6 hours as needed breakthrough pain.     DVT prophylaxis: Heparin>>> Lovenox Code Status: Full Family Communication: No family at bedside. Disposition: Inpatient psychiatry when bed available.  Status is: Inpatient Remains inpatient appropriate because: Severity of illness   Consultants:  Psychiatry: Dr. Enedina Finner 02/28/2024  Procedures:  CT head 02/23/2024 Chest x-ray 02/23/2024, 02/24/2024 MRI L-spine 02/24/2024 MRI brain 02/24/2024  Antimicrobials:  Anti-infectives (From admission, onward)    Start     Dose/Rate Route Frequency Ordered Stop   03/02/24 1400  amoxicillin-clavulanate (AUGMENTIN) 875-125 MG per tablet 1 tablet        1 tablet Oral Every 12 hours 03/02/24 1302 03/04/24 0959   02/25/24 2200  doxycycline (VIBRA-TABS) tablet 100 mg        100 mg Oral Every 12 hours 02/25/24 1042     02/25/24 1800  Ampicillin-Sulbactam (UNASYN) 3 g in sodium chloride 0.9 % 100 mL IVPB  Status:  Discontinued        3 g 200 mL/hr over 30 Minutes Intravenous Every 8 hours 02/25/24 1539 03/02/24 1302   02/24/24 1000  Ampicillin-Sulbactam (UNASYN) 3 g in sodium chloride 0.9 % 100 mL IVPB  Status:  Discontinued  3 g 200 mL/hr over 30 Minutes Intravenous Every 12 hours 02/24/24 0127 02/25/24 1539   02/23/24 2000  cefTRIAXone (ROCEPHIN) 2 g in sodium chloride 0.9 % 100 mL IVPB        2 g 200 mL/hr over 30 Minutes Intravenous Once 02/23/24 1947 02/23/24 2059   02/23/24 2000  doxycycline (VIBRAMYCIN) 100 mg in sodium chloride 0.9 % 250 mL IVPB  Status:  Discontinued        100 mg 125  mL/hr over 120 Minutes Intravenous Every 12 hours 02/23/24 1947 02/25/24 1042         Subjective: Laying in bed sleeping.  Per nurse tech patient was up most of the night.  Restraints off.  Denies any chest pain or shortness of breath.  No abdominal pain.  Denies any suicidal or homicidal ideation.  Denies any hallucinations.  Tolerated diet.  Overnight noted to have urinary retention and Foley catheter placed.   Objective: Vitals:   03/02/24 2112 03/03/24 0514 03/03/24 0758 03/03/24 1546  BP: 128/62 117/68 127/64 121/66  Pulse: 99 99    Resp: 20 20 20 20   Temp: 97.9 F (36.6 C) 99.2 F (37.3 C) 98.4 F (36.9 C) 98 F (36.7 C)  TempSrc: Oral Oral Oral Oral  SpO2: 99% 99% 100% 100%  Weight:      Height:        Intake/Output Summary (Last 24 hours) at 03/03/2024 1641 Last data filed at 03/03/2024 1553 Gross per 24 hour  Intake 2350 ml  Output 2900 ml  Net -550 ml   Filed Weights   02/23/24 1927  Weight: 65.3 kg    Examination:  General exam: NAD.  Respiratory system: CTAB.  No wheezes, no crackles, no rhonchi.  Fair air movement.  Speaking in full sentences.  Cardiovascular system: Regular rate rhythm no murmurs rubs or gallops.  No JVD.  No pitting lower extremity edema. Gastrointestinal system: Abdomen is soft, nontender, nondistended, positive bowel sounds.  No rebound.  No guarding.  Central nervous system: Alert and oriented.  Moving extremities spontaneously.  No focal neurological deficits. Extremities: Symmetric 5 x 5 power. Skin: No rashes, lesions or ulcers Psychiatry: Judgement and insight appear poor-fair. Mood & affect depressed.     Data Reviewed: I have personally reviewed following labs and imaging studies  CBC: Recent Labs  Lab 02/26/24 0542 02/27/24 0339 02/28/24 0324 02/29/24 0343 03/01/24 0134 03/03/24 0834  WBC 11.4* 8.4 7.3 7.0 7.6 7.6  NEUTROABS 10.5*  --   --   --   --   --   HGB 11.9* 10.5* 10.2* 10.3* 10.9* 10.9*  HCT 35.7* 30.7*  30.8* 31.3* 32.5* 33.7*  MCV 96.0 93.9 96.3 96.3 96.2 97.4  PLT 201 201 205 228 279 321    Basic Metabolic Panel: Recent Labs  Lab 02/26/24 0542 02/27/24 0339 02/27/24 2004 02/28/24 0324 02/29/24 0343 03/01/24 0134 03/03/24 0834  NA 140 139 143 142 141 145 145  K 3.5 2.8* 3.5 3.6 3.5 3.8 3.2*  CL 104 109 114* 114* 111 111 110  CO2 20* 19* 20* 19* 19* 22 22  GLUCOSE 91 92 94 106* 98 127* 83  BUN 11 9 6* 5* 7* 10 10  CREATININE 1.21* 0.91 1.00 1.02* 1.05* 1.15* 1.17*  CALCIUM 9.6 8.4* 8.5* 8.6* 8.8* 9.0 9.0  MG 1.8 1.9  --  1.8 1.7 2.2 1.8  PHOS 2.6 2.9  --  2.2*  --  4.0 3.2    GFR: Estimated Creatinine  Clearance: 43.7 mL/min (A) (by C-G formula based on SCr of 1.17 mg/dL (H)).  Liver Function Tests: Recent Labs  Lab 02/26/24 0542 02/27/24 0339 02/28/24 0324 03/01/24 0134 03/03/24 0834  ALBUMIN 3.5 2.8* 2.8* 2.8* 2.9*    CBG: No results for input(s): "GLUCAP" in the last 168 hours.   Recent Results (from the past 240 hours)  Resp panel by RT-PCR (RSV, Flu A&B, Covid) Anterior Nasal Swab     Status: None   Collection Time: 02/23/24  8:10 PM   Specimen: Anterior Nasal Swab  Result Value Ref Range Status   SARS Coronavirus 2 by RT PCR NEGATIVE NEGATIVE Final   Influenza A by PCR NEGATIVE NEGATIVE Final   Influenza B by PCR NEGATIVE NEGATIVE Final    Comment: (NOTE) The Xpert Xpress SARS-CoV-2/FLU/RSV plus assay is intended as an aid in the diagnosis of influenza from Nasopharyngeal swab specimens and should not be used as a sole basis for treatment. Nasal washings and aspirates are unacceptable for Xpert Xpress SARS-CoV-2/FLU/RSV testing.  Fact Sheet for Patients: BloggerCourse.com  Fact Sheet for Healthcare Providers: SeriousBroker.it  This test is not yet approved or cleared by the [place] FDA and has been authorized for detection and/or diagnosis of SARS-CoV-2 by FDA under an Emergency Use  Authorization (EUA). This EUA will remain in effect (meaning this test can be used) for the duration of the COVID-19 declaration under Section 564(b)(1) of the Act, 21 U.S.C. section 360bbb-3(b)(1), unless the authorization is terminated or revoked.     Resp Syncytial Virus by PCR NEGATIVE NEGATIVE Final    Comment: (NOTE) Fact Sheet for Patients: BloggerCourse.com  Fact Sheet for Healthcare Providers: SeriousBroker.it  This test is not yet approved or cleared by the [place] FDA and has been authorized for detection and/or diagnosis of SARS-CoV-2 by FDA under an Emergency Use Authorization (EUA). This EUA will remain in effect (meaning this test can be used) for the duration of the COVID-19 declaration under Section 564(b)(1) of the Act, 21 U.S.C. section 360bbb-3(b)(1), unless the authorization is terminated or revoked.  Performed at [facility], [address] [place], [place] [address]   Blood Culture (routine x 2)     Status: None   Collection Time: 02/23/24  8:10 PM   Specimen: BLOOD  Result Value Ref Range Status   Specimen Description BLOOD RIGHT ANTECUBITAL  Final   Special Requests   Final    BOTTLES DRAWN AEROBIC AND ANAEROBIC Blood Culture results may not be optimal due to an inadequate volume of blood received in culture bottles   Culture   Final    NO GROWTH 5 DAYS Performed at [facility], [address] [place], [place] [address]    Report Status 02/28/2024 FINAL  Final         Radiology Studies: No results found.       Scheduled Meds:  ALPRAZolam  0.5 mg Oral BID   amoxicillin-clavulanate  1 tablet Oral Q12H   aspirin EC  81 mg Oral Daily   Chlorhexidine Gluconate Cloth  6 each Topical Q0600   doxycycline  100 mg Oral Q12H   enoxaparin (LOVENOX) injection  40 mg Subcutaneous Q24H   escitalopram  5 mg Oral Daily   famotidine  20 mg Oral QHS   gabapentin  100 mg Oral BID    levothyroxine  50 mcg Oral QAC breakfast   morphine  15 mg Oral Q12H   multivitamin with minerals  1 tablet  Oral Q24H   OLANZapine  2.5 mg Oral BID   pantoprazole  40 mg Oral Daily   rosuvastatin  10 mg Oral Daily   sodium chloride flush  10-40 mL Intracatheter Q12H   thiamine  100 mg Oral Daily   Continuous Infusions:     LOS: 8 days    Time spent: 35 minutes    Ramiro Harvest, MD [facility]   To contact the attending provider between 7A-7P or the covering provider during after hours 7P-7A, please log into the web site [email or web address] and access using universal Welcome password for that web site. If you do not have the password, please call the hospital operator.  03/03/2024, 4:41 PM

## 2024-03-03 NOTE — TOC Progression Note (Addendum)
 Transition of Care [facility]) - Progression Note    Patient Details  Name: Sharon Huffman MRN: [id number] Date of Birth: 09-11-1961  [facility] CM/SW Contact  Lorri Frederick, LCSW Phone Number: 03/03/2024, 1:26 PM  Clinical Narrative:    [name] asking if pt has foley.  CSW confirmed that pt does, message left with this info.  CSW LM with [name] requesting update on referral.    1335: [name]: they cannot accept with foley catheter.  MD informed.    Expected Discharge Plan: Home/Self Care Barriers to Discharge: Continued Medical Work up  Expected Discharge Plan and Services   Discharge Planning Services: CM Consult   Living arrangements for the past 2 months: Single Family Home                                       Social Determinants of Health (SDOH) Interventions SDOH Screenings   Food Insecurity: No Food Insecurity (02/24/2024)  Housing: Low Risk  (02/24/2024)  Transportation Needs: No Transportation Needs (02/24/2024)  Utilities: Not At Risk (02/24/2024)  Social Connections: Moderately Isolated (02/24/2024)  Tobacco Use: Medium Risk (02/23/2024)    Readmission Risk Interventions     No data to display

## 2024-03-04 DIAGNOSIS — T50902D Poisoning by unspecified drugs, medicaments and biological substances, intentional self-harm, subsequent encounter: Secondary | ICD-10-CM | POA: Diagnosis not present

## 2024-03-04 LAB — BASIC METABOLIC PANEL
Anion gap: 8 (ref 5–15)
BUN: 9 mg/dL (ref 8–23)
CO2: 22 mmol/L (ref 22–32)
Calcium: 9.2 mg/dL (ref 8.9–10.3)
Chloride: 114 mmol/L — ABNORMAL HIGH (ref 98–111)
Creatinine, Ser: 1.19 mg/dL — ABNORMAL HIGH (ref 0.44–1.00)
GFR, Estimated: 51 mL/min — ABNORMAL LOW (ref >60)
Glucose, Bld: 99 mg/dL (ref 70–99)
Potassium: 4.2 mmol/L (ref 3.5–5.1)
Sodium: 144 mmol/L (ref 135–145)

## 2024-03-04 LAB — MAGNESIUM: Magnesium: 1.8 mg/dL (ref 1.7–2.4)

## 2024-03-04 MED ORDER — OLANZAPINE 2.5 MG PO TABS
2.5000 mg | ORAL_TABLET | Freq: Every day | ORAL | Status: DC
  Administered 2024-03-05 – 2024-03-09 (×5): 2.5 mg via ORAL
  Filled 2024-03-04 (×6): qty 1

## 2024-03-04 MED ORDER — TAMSULOSIN HCL 0.4 MG PO CAPS
0.4000 mg | ORAL_CAPSULE | Freq: Every day | ORAL | Status: DC
  Administered 2024-03-04 – 2024-03-10 (×7): 0.4 mg via ORAL
  Filled 2024-03-04 (×7): qty 1

## 2024-03-04 NOTE — Progress Notes (Signed)
 OT Cancellation Note  Patient Details Name: Sharon Huffman MRN: [id number] DOB: 1961/08/24   Cancelled Treatment:    Reason Eval/Treat Not Completed: Patient's level of consciousness. On OT arrival, pt sleep. Per NT, pt did not sleep at night, and is sleep now. Will return as able to.   Ivor Messier, OT  Acute Rehabilitation Services Office [phone] Secure chat preferred   Marilynne Drivers 03/04/2024, 12:04 PM

## 2024-03-04 NOTE — Plan of Care (Signed)

## 2024-03-04 NOTE — Progress Notes (Signed)
 [facility] consult for lab draw. Notified nurse that this patient is a lab draw. [facility]line does not have blood return. RN VU. Tomasita Morrow, RN VAST

## 2024-03-04 NOTE — Consult Note (Signed)
 [facility] Psychiatric Consult Follow up  Patient Name: .ICA Huffman  MRN: [id number]  DOB: 1961/08/20  Consult Order details:  Orders (From admission, onward)     Start     Ordered   02/24/24 1553  IP CONSULT TO PSYCHIATRY       Ordering Provider: Barnetta Chapel, MD  Provider:  (Not yet assigned)  Question Answer Comment  Location [facility]   Reason for Consult? intentional drug overdose      02/24/24 1552             Mode of Visit: In person    Psychiatry Consult Evaluation  Service Date: March 04, 2024 LOS:  LOS: 9 days  Chief Complaint "I just need to get over saying that.  I feel pretty good.  I'm assuming I tried to kill myself because I ended up in the hospital for five months," and then confusion.  Primary Psychiatric Diagnoses  Major depressive disorder, recurrent, severe  2.  Delirium 3.  General anxiety d/o  Assessment  Sharon Huffman is a 63 y.o. female admitted: Medicallyfor 02/23/2024  7:22 PM for intentional overdose. She carries the psychiatric diagnoses of MDD and GAD and has a past medical history of asthma, hypothyroidism, AKI, chronic fatigue., and hyperlipidemia   Her current presentation of intermittent confusion with agitation is most consistent with delirium. She meets criteria for MDD based on grief issues, difficulty coping, and suicide attempt.  Current outpatient psychotropic medications include Xanax, Prozac, and Adderall and historically she has had a low response to these medications. She was compliant with medications prior to admission as evidenced by husband's report except she was overtaking them. On initial examination, patient had a brief moment of clarity but mostly confused. Please see plan below for detailed recommendations.   02/29/2024: Her cognition remains the same with pleasant confusion and restlessness, constantly trying to get out of the bed, restraints in place.  Rambles with irrelevant sentences, consistent  with delirium.  Her sitter reported that she did not sleep well last night.  She did eat her breakfast and had a bath without issues, no aggression.  The client continues to talk to people who are not there.  Consulted with the psychiatrist and medications changed to assist, hopefully.  03/02/24: On reassessment today, patient found, resting calmly bed with her eyes closed but responsive to conversation. Had breakfast this morning and on behavioral issues reported. She was able to report and describe her headache but the conversation thereafter remained disorganized and largely tangential. She believed that she was at a hotel and was unable to answer orientation questions. Patient continues to exhibit what appeared to be a waxing and waning presentation which is consistent with delirium. Patient was gently reoriented to reality. Tylenol was ordered for headache.   03/04/24: Patient presents calm and pleasant with a brighter affect this morning. Cognition seems to have greatly improved since our last encounter on Monday. Today's conversation is linear, logical and goal directed. She is alert and oriented to person, place, time and situation. Patient has been able to reflect on what appears to have triggered the suicide attempts ( death of her mother) and she is now processing the situation logically. While she is improving, she also recognizes that she requires further psychiatric stabilization in a psychiatric hospital. Patient denied SI/HI, plan or intent to harm and also denied hallucinations in all modalities. Her husband who was at her bedside also agrees with the plan. We recommend  a referrer to psychiatric inpatient hospital once she is medically cleared.    Diagnoses:  Active Hospital problems: Principal Problem:   Overdose Active Problems:   Depression   Hypophosphatemia   Hyperkalemia   Hypomagnesemia   ADHD   Aspiration pneumonia (HCC)   Nausea and vomiting   Dehydration   AKI (acute  kidney injury) (HCC)   Hyperlipidemia   Hypothyroidism   Major depressive disorder, recurrent severe without psychotic features (HCC)   Acute urinary retention   Hypokalemia   Chronic pain    Plan   ## Psychiatric Medication Recommendations:  Continue Xanax 0.5 mg BID  Continue gabapentin 100 mg BID with potential to increase this Continue Zyprexa 2.5 mg BID Continue Lexapro 5 mg daily -- 3/19- May increase Lexapro once we get the result of the repeat EKG.    ## Medical Decision Making Capacity: Not specifically addressed in this encounter  ## Further Work-up:  -- most recent EKG on 02/23/2024 had QtC of 458 -- Pertinent labwork reviewed earlier this admission includes: CMP, CBC with diff, UDS, and U/A -- Elevated creatinine of 1.17 and 3.2 potasium on 3/18. She completed potasium replacement therapy yesterday.  -- Repeat EKG on 03/04/22  ## Disposition:-- We recommend inpatient psychiatric hospitalization when medically cleared. Patient is under voluntary admission status at this time; please IVC if attempts to leave hospital.  ## Behavioral / Environmental: -Delirium Precautions: Delirium Interventions for Nursing and Staff: - RN to open blinds every AM. - To Bedside: Glasses, hearing aide, and pt's own shoes. Make available to patients. when possible and encourage use. - Encourage po fluids when appropriate, keep fluids within reach. - OOB to chair with meals. - Passive ROM exercises to all extremities with AM & PM care. - RN to assess orientation to person, time and place QAM and PRN. - Recommend extended visitation hours with familiar family/friends as feasible. - Staff to minimize disturbances at night. Turn off television when pt asleep or when not in use.    ## Safety and Observation Level:  - Based on my clinical evaluation, I estimate the patient to be at moderate risk of self harm in the current setting. - At this time, we recommend  routine. This decision is based on my  review of the chart including patient's history and current presentation, interview of the patient, mental status examination, and consideration of suicide risk including evaluating suicidal ideation, plan, intent, suicidal or self-harm behaviors, risk factors, and protective factors. This judgment is based on our ability to directly address suicide risk, implement suicide prevention strategies, and develop a safety plan while the patient is in the clinical setting. Please contact our team if there is a concern that risk level has changed.  CSSR Risk Category:C-SSRS RISK CATEGORY: No Risk  Suicide Risk Assessment: Patient has following modifiable risk factors for suicide: under treated depression , which we are addressing by adjusting medications. Patient has following non-modifiable or demographic risk factors for suicide: history of suicide attempt Patient has the following protective factors against suicide: Access to outpatient mental health care and Supportive family  Thank you for this consult request. Recommendations have been communicated to the primary team.  We will continue to follow at this time.   Marcell Anger, NP       History of Present Illness  Relevant Aspects of [facility] Course:  Admitted on 02/23/2024 for intentional overdose. They are delirious with some improvement today.   Patient Report:  Patient seen this morning  in her room, she was observed ambulating to the bathroom with walker. Patient stated that she realized that she has not being "the nicest person and I feel embarrassed for my behavior last week." Noted that she was in a dark place when her mother died but she is gradually coming to terms with it. Patient mentioned that she single handedly cared for her ailing mother and she "tried my best." Patient acknowledged the presence of her husband who was at her bedside, stating that she "felt grateful and blessed" to have his support.   Concerning continuity of  care, she acknowledged the need for further psychiatric stabilization in an inpatient hospital. Patient denied SI/HI, plan or intent to harm and also denied hallucinations in all modalities.   Husband was present at bedside and he noted that patient has being improving for the past few days.   Psych ROS:  Depression: present Anxiety:  high Mania (lifetime and current): UTA Psychosis: (lifetime and current): hallucinating  Collateral information:  Her husband at her bedside reported she was "more subdued" and less agitated. "She kinda has some data at times, facts are buried with the confusion". He does feel she is doing better physically without so many medications , especially with opiates. Contributes the client to downward spiraling the death of her mother 5 weeks ago that she was caring for. This is when she started over taking her medications.   Review of Systems  Neurological:  Positive for sensory change.  Psychiatric/Behavioral:  Positive for hallucinations and memory loss. The patient is nervous/anxious.      Psychiatric and Social History  Psychiatric History:  Information collected from patient, chart, sitter, and husband (3/12)  Prev Dx/Sx: MDD, grief, GAD, ADHD Current Psych Provider: [facility] Meds (current): Xanax, Prozac, Adderall Previous Med Trials: unknown Therapy: UTA  Prior Psych Hospitalization: none  Prior Self Harm: UTA Prior Violence: UTA  Family Psych History: UTA Family Hx suicide: UTA  Social History:  Living Situation: lives with her husband  Access to weapons/lethal means: none   Substance History UTA Her husband reported she was overtaking her medication at home   Exam Findings  Physical Exam:  Vital Signs:  Temp:  [98 F (36.7 C)-98.6 F (37 C)] 98.6 F (37 C) (03/19 0757) Pulse Rate:  [98-119] 119 (03/19 0757) Resp:  [17-20] 17 (03/19 0757) BP: (121-137)/(66-72) 134/72 (03/19 0757) SpO2:  [99 %-100 %] 100 % (03/19  0757) Blood pressure 134/72, pulse (!) 119, temperature 98.6 F (37 C), temperature source Oral, resp. rate 17, height 5\' 5"  (1.651 m), weight 65.3 kg, SpO2 100%. Body mass index is 23.96 kg/m.  Physical Exam Vitals and nursing note reviewed.  HENT:     Head: Normocephalic.     Comments: headache    Nose: Nose normal.  Pulmonary:     Effort: Pulmonary effort is normal.  Musculoskeletal:     Cervical back: Normal range of motion.  Neurological:     Mental Status: She is alert.  Psychiatric:        Mood and Affect: Mood normal.        Behavior: Behavior normal.     Mental Status Exam: General Appearance: Neat and hospital gown  Orientation:  person, place, time and situation  Memory:  Immediate;   Good Recent;   Good Remote;   Good  Concentration:  Concentration: Good and Attention Span: Good  Recall:  Good  Attention  Good  Eye Contact:  Good  Speech:  Normal Rate   Language:  Good  Volume:  Normal  Mood: "Grateful"  Affect:   Bright  Thought Process:  Coherent, Goal Directed, and Linear  Thought Content:  Logical and Rumination  Suicidal Thoughts:  No, denies  Homicidal Thoughts: Denies  Judgement:  Good  Insight:  Fair, improving   Psychomotor Activity:  Normal  Akathisia:  No  Fund of Knowledge:  Fair      Assets:  Housing Leisure Time Resilience Social Support  Cognition:  WNL  ADL's:  Intact with minimal assistance  AIMS (if indicated):        Other History   These have been pulled in through the EMR, reviewed, and updated if appropriate.  Family History:  The patient's family history includes Dementia in her mother; Heart attack in her father; Heart disease in her father; Hyperlipidemia in her mother; Hypertension in her mother.  Medical History: Past Medical History:  Diagnosis Date   ADHD    Anxiety    Bronchitis, chronic (HCC)    Cervical cancer (HCC)    Depression    Depression    Hyperlipidemia    Hypertension    Insomnia disorder     Lyme disease    Migraine    Ovarian cancer (HCC)    Palpitations     Surgical History: Past Surgical History:  Procedure Laterality Date   ABDOMINAL HYSTERECTOMY  1994   ANTERIOR CERVICAL DECOMP/DISCECTOMY FUSION  2000   AUGMENTATION MAMMAPLASTY Bilateral 1998   BREAST ENHANCEMENT SURGERY Bilateral    EYE SURGERY  2016   PVD     Medications:   Current Facility-Administered Medications:    ALPRAZolam (XANAX) tablet 0.5 mg, 0.5 mg, Oral, BID, Lord, Jamison Y, NP, 0.5 mg at 03/04/24 0827   aspirin EC tablet 81 mg, 81 mg, Oral, Daily, Skip Mayer A, MD, 81 mg at 03/04/24 0827   Chlorhexidine Gluconate Cloth 2 % PADS 6 each, 6 each, Topical, Q0600, Albertine Grates, MD, 6 each at 03/04/24 0534   enoxaparin (LOVENOX) injection 40 mg, 40 mg, Subcutaneous, Q24H, Rodolph Bong, MD, 40 mg at 03/03/24 2137   escitalopram (LEXAPRO) tablet 5 mg, 5 mg, Oral, Daily, Rodolph Bong, MD, 5 mg at 03/04/24 0827   famotidine (PEPCID) tablet 20 mg, 20 mg, Oral, QHS, Rodolph Bong, MD, 20 mg at 03/03/24 2138   gabapentin (NEURONTIN) capsule 100 mg, 100 mg, Oral, BID, Charm Rings, NP, 100 mg at 03/04/24 5784   Gerhardt's butt cream, , Topical, PRN, Rodolph Bong, MD, Given at 03/03/24 1744   levothyroxine (SYNTHROID) tablet 50 mcg, 50 mcg, Oral, QAC breakfast, Rodolph Bong, MD, 50 mcg at 03/04/24 0526   morphine (MS CONTIN) 12 hr tablet 15 mg, 15 mg, Oral, Q12H, Rodolph Bong, MD, 15 mg at 03/04/24 0930   multivitamin with minerals tablet 1 tablet, 1 tablet, Oral, Q24H, Lord, Jamison Y, NP, 1 tablet at 03/03/24 1339   OLANZapine (ZYPREXA) tablet 2.5 mg, 2.5 mg, Oral, BID, Lord, Jamison Y, NP, 2.5 mg at 03/04/24 6962   oxyCODONE (Oxy IR/ROXICODONE) immediate release tablet 5 mg, 5 mg, Oral, Q8H PRN, Rodolph Bong, MD   pantoprazole (PROTONIX) EC tablet 40 mg, 40 mg, Oral, Daily, Rodolph Bong, MD, 40 mg at 03/04/24 9528   polyethylene glycol (MIRALAX /  GLYCOLAX) packet 17 g, 17 g, Oral, Daily PRN, Rodolph Bong, MD   rosuvastatin (CRESTOR) tablet 10 mg, 10 mg, Oral, Daily, Rodolph Bong, MD,  10 mg at 03/04/24 0827   senna-docusate (Senokot-S) tablet 1 tablet, 1 tablet, Oral, QHS PRN, Rodolph Bong, MD   sodium chloride flush (NS) 0.9 % injection 10-40 mL, 10-40 mL, Intracatheter, Q12H, Rodolph Bong, MD, 10 mL at 03/04/24 0828   sodium chloride flush (NS) 0.9 % injection 10-40 mL, 10-40 mL, Intracatheter, PRN, Rodolph Bong, MD   tamsulosin [facility]) capsule 0.4 mg, 0.4 mg, Oral, QPC breakfast, Lynden Oxford M, MD, 0.4 mg at 03/04/24 0930   thiamine (VITAMIN B1) tablet 100 mg, 100 mg, Oral, Daily, Charm Rings, NP, 100 mg at 03/04/24 1610  Allergies: Allergies  Allergen Reactions   Amlodipine Swelling and Other (See Comments)    Leg edema and fluid retention   Atorvastatin Other (See Comments)    Other reaction(s): back ache   Bystolic [Nebivolol Hcl] Other (See Comments)    sweat and get clammy   Carbamazepine Other (See Comments)    rash   Mirtazapine Other (See Comments)    Other reaction(s): edema, vivid nightmares, patient was unsure    Penicillins Nausea And Vomiting   Sulfacetamide Nausea Only    Khaliyah Northrop, NP

## 2024-03-04 NOTE — Progress Notes (Signed)
 Occupational Therapy Treatment Patient Details Name: Sharon Huffman MRN: [id number] DOB: 04-23-1961 Today's Date: 03/04/2024   History of present illness Pt is a 63 y.o. female admitted 02/23/24 via EMS for possible drug overdose. PMH: anxiety, bronchitis, cervical cancer, depression, hyperlipidemia, HTN, lyme disease, migraine, ovarian cancer.   OT comments  Pt initially slow to mobilize and initiate tasks, requiring heavy cues to sequence bed mobility and STS transfer. RN stated pt just woke up, pt reporting feeling "disoriented". Pt required <3 cues to complete 3 step tasks. During conversation pt would verbalize "I'm in the real world" or "what happens in the real world", but when asked where is she, pt was able to verbalize hospital.  Throughout session pt required increased time and cues for safety with RW during mobility. OT will continue to follow acutely to maximize functional independence.      If plan is discharge home, recommend the following:  A little help with walking and/or transfers;A little help with bathing/dressing/bathroom;Assistance with cooking/housework;Direct supervision/assist for medications management;Direct supervision/assist for financial management;Supervision due to cognitive status   Equipment Recommendations  BSC/3in1    Recommendations for Other Services      Precautions / Restrictions Precautions Precautions: Fall Recall of Precautions/Restrictions: Impaired Precaution/Restrictions Comments: Recruitment consultant Restrictions Weight Bearing Restrictions Per Provider Order: No       Mobility Bed Mobility Overal bed mobility: Needs Assistance Bed Mobility: Supine to Sit, Sit to Supine     Supine to sit: HOB elevated, Used rails, Mod assist Sit to supine: Min assist   General bed mobility comments: Assist for legs and trunk, along with cueing and increased time    Transfers Overall transfer level: Needs assistance Equipment used: Rolling walker (2  wheels) Transfers: Sit to/from Stand Sit to Stand: Min assist           General transfer comment: Min assist STS, Min assist to steady during ambulation     Balance Overall balance assessment: Needs assistance Sitting-balance support: Feet supported, Bilateral upper extremity supported, No upper extremity supported Sitting balance-Leahy Scale: Fair Sitting balance - Comments: Pt able to reach LEs while seated EOB with no LOB   Standing balance support: During functional activity, No upper extremity supported Standing balance-Leahy Scale: Fair                             ADL either performed or assessed with clinical judgement   ADL Overall ADL's : Needs assistance/impaired Eating/Feeding: Set up;Bed level   Grooming: Wash/dry hands;Wash/dry face;Oral care;Contact guard assist;Standing Grooming Details (indicate cue type and reason): Min assist to steady             Lower Body Dressing: Supervision/safety;Sitting/lateral leans Lower Body Dressing Details (indicate cue type and reason): To don socks Toilet Transfer: Contact guard assist;Ambulation;Rolling walker (2 wheels) Toilet Transfer Details (indicate cue type and reason): Simulated in room         Functional mobility during ADLs: Rolling walker (2 wheels);Contact guard assist      Extremity/Trunk Assessment Upper Extremity Assessment Upper Extremity Assessment: RUE deficits/detail RUE Deficits / Details: Shoulder flex & abd at approx 90 degrees RUE: Shoulder pain with ROM RUE Coordination: decreased gross motor   Lower Extremity Assessment Lower Extremity Assessment: Generalized weakness LLE Deficits / Details: Pt's L knee buckles during mobillity        Vision   Vision Assessment?: No apparent visual deficits   Perception     Praxis  Communication Communication Communication: No apparent difficulties Factors Affecting Communication: Difficulty expressing self (disorganized  thoughts)   Cognition Arousal: Alert Behavior During Therapy: WFL for tasks assessed/performed Cognition: Cognition impaired   Orientation impairments: Situation Awareness: Intellectual awareness intact, Online awareness impaired Memory impairment (select all impairments): Short-term memory, Working memory Attention impairment (select first level of impairment): Sustained attention Executive functioning impairment (select all impairments): Organization, Sequencing, Reasoning, Problem solving, Initiation OT - Cognition Comments: Per husband in last 5 weeks there has been psychological problems from grief                 Following commands: Impaired Following commands impaired: Follows one step commands inconsistently, Follows one step commands with increased time, Follows multi-step commands inconsistently      Cueing   Cueing Techniques: Verbal cues, Tactile cues, Visual cues  Exercises      Shoulder Instructions       General Comments      Pertinent Vitals/ Pain       Pain Assessment Pain Assessment: Faces Faces Pain Scale: Hurts little more Pain Location: Back Pain Descriptors / Indicators: Discomfort Pain Intervention(s): Monitored during session, Repositioned  Home Living                                          Prior Functioning/Environment              Frequency  Min 2X/week        Progress Toward Goals  OT Goals(current goals can now be found in the care plan section)  Progress towards OT goals: Progressing toward goals  Acute Rehab OT Goals Patient Stated Goal: To go home OT Goal Formulation: With patient Time For Goal Achievement: 03/12/24 Potential to Achieve Goals: Good ADL Goals Pt Will Perform Upper Body Dressing: with set-up;sitting Pt Will Perform Lower Body Dressing: with min assist;sit to/from stand Pt Will Transfer to Toilet: with contact guard assist;bedside commode;ambulating  Plan       Co-evaluation                 AM-PAC OT "6 Clicks" Daily Activity     Outcome Measure   Help from another person eating meals?: A Little Help from another person taking care of personal grooming?: A Little Help from another person toileting, which includes using toliet, bedpan, or urinal?: A Little Help from another person bathing (including washing, rinsing, drying)?: A Little Help from another person to put on and taking off regular upper body clothing?: A Little Help from another person to put on and taking off regular lower body clothing?: A Little 6 Click Score: 18    End of Session Equipment Utilized During Treatment: Gait belt;Rolling walker (2 wheels)  OT Visit Diagnosis: Unsteadiness on feet (R26.81);Other abnormalities of gait and mobility (R26.89);Muscle weakness (generalized) (M62.81);Other symptoms and signs involving cognitive function   Activity Tolerance Patient limited by fatigue;Patient limited by lethargy   Patient Left in bed;with call bell/phone within reach;with bed alarm set;with nursing/sitter in room   Nurse Communication Mobility status        Time: 6578-4696 OT Time Calculation (min): 29 min  Charges: OT General Charges $OT Visit: 1 Visit OT Treatments $Self Care/Home Management : 23-37 mins  Ivor Messier, OT  Acute Rehabilitation Services Office [phone] Secure chat preferred   Marilynne Drivers 03/04/2024, 2:34 PM

## 2024-03-04 NOTE — Progress Notes (Signed)
 PROGRESS NOTE    Sharon Huffman  ZOX:[id number] DOB: 1961/03/25 DOA: 02/23/2024 PCP: Merri Brunette, MD    Chief Complaint  Patient presents with   Drug Overdose    Brief Narrative:  Patient is a 63 year old female past medical history significant for anxiety, depression, hyperlipidemia, hypertension, hypothyroidism, Lyme's disease, migraine headache and ADHD.  Patient was found down at home (unresponsive) by her husband.  Patient had agonal breathing, with O2 sat of 70% on nonrebreather.  Patient was administered IM Narcan 2 Mg and Glasgow Coma Scale improved from 4-13.  O2 sat improved to 90% on nonrebreather.  Patient lost her mother recently.  Patient is known to have access to morphine, Xanax and Norco.  There are concerns that the patient may have taken the medications.  Psychiatric team has been consulted.  Assessment & Plan:  #1 drug overdose -Likely intentional overdose. -Patient recently noted to have lost her mother who died in her arms per patient and since then patient has felt depressed and etiology of why she took medications however cannot quite tell me what specific medications and how much she took. -Patient currently denying any suicidal ideation or homicidal ideation at this time. -Drug overdose complicated by aspiration and patient on antibiotics. -Continue one-to-one sitter. -Patient tried restraints during the hospitalization secondary to agitation and auditory hallucinations.   -Restraints removed on 03/01/2024 and patient less agitated and denying any hallucinations.  -Status post IVC. -Psychiatry consulted who have assessed patient, adjustments to medications and recommendations made as noted in problem #9. -Psychiatry recommending inpatient psychiatric hospitalization once patient is medically stable.  2.  Probable aspiration pneumonia -On presentation patient placed initially on IV Unasyn and doxycycline.  -Was initially on a dysphagia 2 diet and diet advanced to a  dysphagia 3 diet which patient is tolerating.     -Antibiotic completed. -Was on IV Unasyn and transition to Augmentin to complete a 7-day course of antibiotic treatment. -SLP following.  3.  AKI -Secondary to prerenal azotemia in the setting of spironolactone and Lasix as needed. -Patient clinically dry on examination early on in the hospitalization. -Urinalysis nitrite negative, leukocytes negative, negative for protein, 0-5 WBCs, -Renal function improved with hydration.   -IV fluids have been discontinued.  4.  Dehydration -Saline lock IV fluids.  5.  Hyperlipidemia -Statin.  6.  Mild hyperkalemia -Likely secondary to AKI. -Resolved.  7.  Hypophosphatemia/hypomagnesemia -Phosphorus at 3.2. -Magnesium at 1.8. -Status post 3 days K-Phos.  -Repeat labs in the AM.  8.  Hypertension -BP currently stable. -Continue to hold antihypertensive medications.  9.  ADHD/depression/anxiety/agitation/delirium -Patient noted to be hallucinating per RN and NT. -Patient also noted with bouts of confusion and auditory hallucinations, states she has been speaking with her mother who recently passed on 12-Mar-2024. -Patient also with bouts of agitation and yelling. -Patient with clinical improvement, denies any further auditory hallucinations. -Agitation seems to have improved. -Patient started on Ativan 0.5 mg 3 times daily per psychiatry to avoid withdrawal as patient noted to be on Xanax as needed prior to admission. -Patient seen in consultation by psychiatry and scheduled Ativan and as needed Ativan discontinued. -Patient started on Xanax 0.5 mg twice daily as well as gabapentin 100 mg twice daily per psychiatry. -Seroquel discontinued. -Patient started on Zyprexa 2.5 mg twice daily per psychiatry. -Home regimen Prozac discontinued and patient started on Lexapro 5 mg daily per psychiatric recommendations. -Restraints were removed and have been off for the past 48 hours.  -Continue  delirium precautions. -  Per psychiatry recommended inpatient psychiatric hospitalization once patient medically stable.  10.  Nausea and vomiting -Clinical improvement.   -No further nausea or vomiting. -Status post Dulcolax suppository x 1.   -Laxatives changed to as needed as patient was starting to have diarrhea. -Supportive care.  11.  Migraine headaches -Stable.  12.  Hypothyroidism -Synthroid.   13.  Hypokalemia -Secondary to GI losses as patient noted to have multiple watery loose stools per RN and sitter on 02/27/2024.   -Laxatives changed to as needed.   -Potassium at 3.2 this morning.   -Magnesium noted at 1.8. -K-Lor 40 mEq p.o. every 4 hours x 2 doses. -Repeat labs in the AM.  14.  Diarrhea -Patient with multiple watery loose stools on laxatives. -Diarrhea improved. -Laxatives discontinued and changed to as needed.  15.  Urinary retention -Patient noted with urinary retention underwent I and O cath x 3. -Patient with continued urinary retention overnight and Foley catheter placed.   -Voiding trial in 3 to 5 days.  16.  Chronic pain, POA -Patient noted with a history of chronic pain, on MS Contin 15 mg twice daily as well as Norco 09/18/2024-tablet twice daily in addition to Celebrex prior to admission. -Pain medications held on admission as patient had presented with a drug overdose and had noted to be drowsy early on during the hospitalization. -Mental status improved after starting psych medications. -Will resume home regimen MS Contin 15 mg twice daily. -Oxycodone 5 mg every 6 hours as needed breakthrough pain.     DVT prophylaxis: Heparin>>> Lovenox Code Status: Full Family Communication: No family at bedside. Disposition: Inpatient psychiatry when bed available.  Status is: Inpatient Remains inpatient appropriate because: Severity of illness   Consultants:  Psychiatry: Dr. Enedina Finner 02/28/2024  Procedures:  CT head 02/23/2024 Chest x-ray 02/23/2024,  02/24/2024 MRI L-spine 02/24/2024 MRI brain 02/24/2024  Antimicrobials:  Anti-infectives (From admission, onward)    Start     Dose/Rate Route Frequency Ordered Stop   03/02/24 1400  amoxicillin-clavulanate (AUGMENTIN) 875-125 MG per tablet 1 tablet        1 tablet Oral Every 12 hours 03/02/24 1302 03/03/24 2137   02/25/24 2200  doxycycline (VIBRA-TABS) tablet 100 mg  Status:  Discontinued        100 mg Oral Every 12 hours 02/25/24 1042 03/04/24 0914   02/25/24 1800  Ampicillin-Sulbactam (UNASYN) 3 g in sodium chloride 0.9 % 100 mL IVPB  Status:  Discontinued        3 g 200 mL/hr over 30 Minutes Intravenous Every 8 hours 02/25/24 1539 03/02/24 1302   02/24/24 1000  Ampicillin-Sulbactam (UNASYN) 3 g in sodium chloride 0.9 % 100 mL IVPB  Status:  Discontinued        3 g 200 mL/hr over 30 Minutes Intravenous Every 12 hours 02/24/24 0127 02/25/24 1539   02/23/24 2000  cefTRIAXone (ROCEPHIN) 2 g in sodium chloride 0.9 % 100 mL IVPB        2 g 200 mL/hr over 30 Minutes Intravenous Once 02/23/24 1947 02/23/24 2059   02/23/24 2000  doxycycline (VIBRAMYCIN) 100 mg in sodium chloride 0.9 % 250 mL IVPB  Status:  Discontinued        100 mg 125 mL/hr over 120 Minutes Intravenous Every 12 hours 02/23/24 1947 02/25/24 1042         Subjective: No acute complaint.  No nausea no vomiting.  Reports that she has headache as well as bilateral shoulder pain.  No diarrhea.  Objective:  Vitals:   03/04/24 0757 03/04/24 1019 03/04/24 1206 03/04/24 1629  BP: 134/72 (!) 140/86 123/69 124/68  Pulse: (!) 119 (!) 103 97 100  Resp: 17 16 16 16   Temp: 98.6 F (37 C) 98.5 F (36.9 C) 98.4 F (36.9 C) 98 F (36.7 C)  TempSrc: Oral Oral  Oral  SpO2: 100% 100% 94% 98%  Weight:      Height:        Intake/Output Summary (Last 24 hours) at 03/04/2024 1844 Last data filed at 03/04/2024 1231 Gross per 24 hour  Intake 240 ml  Output 2350 ml  Net -2110 ml   Filed Weights   02/23/24 1927  Weight: 65.3 kg     Examination:  Clear to auscultation. S1-S2 present, no asterixis. Bowel simply noted nontender.  Data Reviewed: I have personally reviewed following labs and imaging studies  CBC: Recent Labs  Lab 02/27/24 0339 02/28/24 0324 02/29/24 0343 03/01/24 0134 03/03/24 0834  WBC 8.4 7.3 7.0 7.6 7.6  HGB 10.5* 10.2* 10.3* 10.9* 10.9*  HCT 30.7* 30.8* 31.3* 32.5* 33.7*  MCV 93.9 96.3 96.3 96.2 97.4  PLT 201 205 228 279 321    Basic Metabolic Panel: Recent Labs  Lab 02/27/24 0339 02/27/24 2004 02/28/24 0324 02/29/24 0343 03/01/24 0134 03/03/24 0834 03/04/24 1147  NA 139   < > 142 141 145 145 144  K 2.8*   < > 3.6 3.5 3.8 3.2* 4.2  CL 109   < > 114* 111 111 110 114*  CO2 19*   < > 19* 19* 22 22 22   GLUCOSE 92   < > 106* 98 127* 83 99  BUN 9   < > 5* 7* 10 10 9   CREATININE 0.91   < > 1.02* 1.05* 1.15* 1.17* 1.19*  CALCIUM 8.4*   < > 8.6* 8.8* 9.0 9.0 9.2  MG 1.9  --  1.8 1.7 2.2 1.8 1.8  PHOS 2.9  --  2.2*  --  4.0 3.2  --    < > = values in this interval not displayed.    GFR: Estimated Creatinine Clearance: 43 mL/min (A) (by C-G formula based on SCr of 1.19 mg/dL (H)).  Liver Function Tests: Recent Labs  Lab 02/27/24 0339 02/28/24 0324 03/01/24 0134 03/03/24 0834  ALBUMIN 2.8* 2.8* 2.8* 2.9*    CBG: No results for input(s): "GLUCAP" in the last 168 hours.   Recent Results (from the past 240 hours)  Resp panel by RT-PCR (RSV, Flu A&B, Covid) Anterior Nasal Swab     Status: None   Collection Time: 02/23/24  8:10 PM   Specimen: Anterior Nasal Swab  Result Value Ref Range Status   SARS Coronavirus 2 by RT PCR NEGATIVE NEGATIVE Final   Influenza A by PCR NEGATIVE NEGATIVE Final   Influenza B by PCR NEGATIVE NEGATIVE Final    Comment: (NOTE) The Xpert Xpress SARS-CoV-2/FLU/RSV plus assay is intended as an aid in the diagnosis of influenza from Nasopharyngeal swab specimens and should not be used as a sole basis for treatment. Nasal washings  and aspirates are unacceptable for Xpert Xpress SARS-CoV-2/FLU/RSV testing.  Fact Sheet for Patients: BloggerCourse.com  Fact Sheet for Healthcare Providers: SeriousBroker.it  This test is not yet approved or cleared by the [place] FDA and has been authorized for detection and/or diagnosis of SARS-CoV-2 by FDA under an Emergency Use Authorization (EUA). This EUA will remain in effect (meaning this test can be used) for the duration of the COVID-19 declaration  under Section 564(b)(1) of the Act, 21 U.S.C. section 360bbb-3(b)(1), unless the authorization is terminated or revoked.     Resp Syncytial Virus by PCR NEGATIVE NEGATIVE Final    Comment: (NOTE) Fact Sheet for Patients: BloggerCourse.com  Fact Sheet for Healthcare Providers: SeriousBroker.it  This test is not yet approved or cleared by the [place] FDA and has been authorized for detection and/or diagnosis of SARS-CoV-2 by FDA under an Emergency Use Authorization (EUA). This EUA will remain in effect (meaning this test can be used) for the duration of the COVID-19 declaration under Section 564(b)(1) of the Act, 21 U.S.C. section 360bbb-3(b)(1), unless the authorization is terminated or revoked.  Performed at [facility], [address], [place], [place] [address]   Blood Culture (routine x 2)     Status: None   Collection Time: 02/23/24  8:10 PM   Specimen: BLOOD  Result Value Ref Range Status   Specimen Description BLOOD RIGHT ANTECUBITAL  Final   Special Requests   Final    BOTTLES DRAWN AEROBIC AND ANAEROBIC Blood Culture results may not be optimal due to an inadequate volume of blood received in culture bottles   Culture   Final    NO GROWTH 5 DAYS Performed at [facility], [address], [place], [place] [address]    Report Status 02/28/2024 FINAL  Final         Radiology  Studies: No results found.       Scheduled Meds:  ALPRAZolam  0.5 mg Oral BID   aspirin EC  81 mg Oral Daily   Chlorhexidine Gluconate Cloth  6 each Topical Q0600   enoxaparin (LOVENOX) injection  40 mg Subcutaneous Q24H   escitalopram  5 mg Oral Daily   famotidine  20 mg Oral QHS   gabapentin  100 mg Oral BID   levothyroxine  50 mcg Oral QAC breakfast   morphine  15 mg Oral Q12H   multivitamin with minerals  1 tablet Oral Q24H   [START ON 03/05/2024] OLANZapine  2.5 mg Oral QHS   pantoprazole  40 mg Oral Daily   rosuvastatin  10 mg Oral Daily   sodium chloride flush  10-40 mL Intracatheter Q12H   tamsulosin  0.4 mg Oral QPC breakfast   thiamine  100 mg Oral Daily   Continuous Infusions:     LOS: 9 days    Time spent: 35 minutes    Lynden Oxford, MD [facility]   To contact the attending provider between 7A-7P or the covering provider during after hours 7P-7A, please log into the web site [email or web address] and access using universal [place] password for that web site. If you do not have the password, please call the hospital operator.  03/04/2024, 6:44 PM

## 2024-03-05 ENCOUNTER — Inpatient Hospital Stay (HOSPITAL_COMMUNITY)

## 2024-03-05 LAB — RENAL FUNCTION PANEL
Albumin: 2.6 g/dL — ABNORMAL LOW (ref 3.5–5.0)
Anion gap: 9 (ref 5–15)
BUN: 17 mg/dL (ref 8–23)
CO2: 23 mmol/L (ref 22–32)
Calcium: 8.8 mg/dL — ABNORMAL LOW (ref 8.9–10.3)
Chloride: 106 mmol/L (ref 98–111)
Creatinine, Ser: 1.56 mg/dL — ABNORMAL HIGH (ref 0.44–1.00)
GFR, Estimated: 37 mL/min — ABNORMAL LOW (ref >60)
Glucose, Bld: 151 mg/dL — ABNORMAL HIGH (ref 70–99)
Phosphorus: 4.2 mg/dL (ref 2.5–4.6)
Potassium: 3.5 mmol/L (ref 3.5–5.1)
Sodium: 138 mmol/L (ref 135–145)

## 2024-03-05 MED ORDER — MIDODRINE HCL 5 MG PO TABS: 5.0000 mg | ORAL_TABLET | Freq: Three times a day (TID) | ORAL | Status: DC

## 2024-03-05 MED ORDER — MIDODRINE HCL 5 MG PO TABS
5.0000 mg | ORAL_TABLET | Freq: Three times a day (TID) | ORAL | Status: DC
  Administered 2024-03-05 – 2024-03-10 (×17): 5 mg via ORAL
  Filled 2024-03-05 (×17): qty 1

## 2024-03-05 MED ORDER — LACTATED RINGERS IV SOLN: INTRAVENOUS | Status: AC

## 2024-03-05 NOTE — Progress Notes (Addendum)
 Speech Language Pathology Treatment: Dysphagia  Patient Details Name: Sharon Huffman MRN: [id number] DOB: [birth year] Today's Date: 03/05/2024 Time: 4098-1191 SLP Time Calculation (min) (ACUTE ONLY): 23 min  Assessment / Plan / Recommendation Clinical Impression  Pt seen for dysphagia f/u session with improved mentation noted and pt willing to consume po intake with min encouragement/mod I verbal cues for bite size.  Pt consumed limited intake d/t "getting ready to eat lunch."  Pt noted diet consistency is adequate and she prefers Dysphagia 3(chopped)/thin liquids vs transitioning to regular consistency.  She also mentioned appetite is improving. Slight xerostomia present potentially d/t medication side effects, but oral hydration prior to intake of solids/puree improved this impact during oral intake.  Pt did not exhibit any overt s/s of aspiration with puree/thin via straw, but limited amount of soft solids observed this session.  Pt became tearful speaking of her Mother's recent passing, but she was able to maintain attention to conversation/maintain alertness throughout session and conversational turns were appropriate when answering personal questions/maintaining topic during conversation.  Pain level 7/10 d/t chronic back pain, R shoulder and R leg (abscess per pt report?) impacting focus intermittently.  Recommend continue current Dysphagia 3(chopped)/thin liquids with general swallowing precautions in place.  ST will f/u briefly for continued dysphagia tx with a meal during acute stay.    HPI HPI: Pt is a 63 y.o. female admitted 02/23/24 via EMS for possible drug overdose. MRI negative. CXR concerning for PNA. PMH: anxiety, bronchitis, cervical cancer, depression, hyperlipidemia, HTN, lyme disease, migraine, ovarian cancer; ST recommending Dysphagia 3/thin liquid diet during swallow evaluation.  ST f/u for dysphagia tx/potential upgrade of diet.      SLP Plan  Continue with current plan of care       Recommendations for follow up therapy are one component of a multi-disciplinary discharge planning process, led by the attending physician.  Recommendations may be updated based on patient status, additional functional criteria and insurance authorization.    Recommendations  Diet recommendations: Dysphagia 3 (mechanical soft);Thin liquid Liquids provided via: Cup;Straw Medication Administration: Whole meds with puree Supervision: Staff to assist with self feeding;Intermittent supervision to cue for compensatory strategies Compensations: Minimize environmental distractions;Slow rate;Small sips/bites Postural Changes and/or Swallow Maneuvers: Seated upright 90 degrees                  Oral care BID   Other (comment) (TBD) Dysphagia, unspecified (R13.10)     Continue with current plan of care     Pat Sharon Huffman,M.S.,CCC-SLP  03/05/2024, 1:23 PM

## 2024-03-05 NOTE — Hospital Course (Addendum)
 PMH of HLD, depression, HTN, hypothyroidism, migraine disease, ADHD presented to hospital with complaints of unresponsive event at home.  Found by husband.  Suspecting intentional substance overdose. Psychiatry was consulted. Recommending inpatient psychiatric admission.   Assessment & Plan: Intentional drug overdose -Likely intentional overdose. -Patient recently noted to have lost her mother who died in her arms per patient and since then patient has felt depressed and etiology of why she took medications however cannot quite tell me what specific medications and how much she took. Currently denying any suicidal or homicidal ideation. Continue one-to-one sitter. Psychiatry consulted. Patient currently voluntary for inpatient psychiatric admission. If the patient tries to leave AMA, psychiatry recommending IVC paperwork. As of discussion on 3/24, psychiatry currently feels that the patient can be discharged home.  They informed me that bedside sitter can be discontinued.  Will follow once the note is available.   Aspiration pneumonia Treated with antibiotic. Oxygenation normal.   AKI Likely from poor p.o. intake. As well as ongoing use of diuretic prior to admission. Renal function improved with IV fluid but worsened again on 3/20. Receiving IV fluid again.  Recommend oral hydration.  Will reduce the rate. Monitor.  Urinary retention. Likely due to decreased alertness. Required a Foley catheter, Foley catheter removed on 3/19. Able to urinate while for now. Monitor.  Hyperlipidemia -Statin.   Hypertension Blood pressure soft. Holding medication for now.   ADHD/depression/anxiety/agitation/delirium Had hallucination earlier.  Currently resolved Continue delirium precautions. Per psychiatry recommended inpatient psychiatric hospitalization once patient medically stable.   Nausea and vomiting Resolved.   Migraine headaches -Stable.   Hypothyroidism -Synthroid.     Hypokalemia Replaced.   IBS diarrhea Patient with multiple watery loose stools on laxatives. Laxatives discontinued and changed to as needed. Diarrhea improved.   Chronic pain, POA Chronically on MS Contin as well as as needed Norco. Will resume home regimen MS Contin 15 mg twice daily. Oxycodone 5 mg every 6 hours as needed breakthrough pain.  Bilateral shoulder pain. X-ray performed. Shows evidence of osteophytes. Will monitor for

## 2024-03-05 NOTE — Progress Notes (Signed)
 Physical Therapy Treatment Patient Details Name: Sharon Huffman MRN: [id number] DOB: 10-30-1961 Today's Date: 03/05/2024   History of Present Illness Pt is a 63 y.o. female admitted 02/23/24 via EMS for possible drug overdose. PMH: anxiety, bronchitis, cervical cancer, depression, hyperlipidemia, HTN, lyme disease, migraine, ovarian cancer.    PT Comments  Pt endorses abdominal discomfort this date, but agreeable to session focused on functional mobility. Pt ambulatory for good hallway distance at supervision level, demonstrates some weaving of gait and difficulty navigating tight quarters. Pt endorses feeling better cognitively. Pt making good progress.     If plan is discharge home, recommend the following: Assistance with cooking/housework;Direct supervision/assist for medications management;Assist for transportation;Help with stairs or ramp for entrance;Supervision due to cognitive status;A little help with walking and/or transfers;A little help with bathing/dressing/bathroom   Can travel by private vehicle        Equipment Recommendations  Rolling walker (2 wheels)    Recommendations for Other Services       Precautions / Restrictions Precautions Precautions: Fall Recall of Precautions/Restrictions: Impaired Precaution/Restrictions Comments: Recruitment consultant Restrictions Weight Bearing Restrictions Per Provider Order: No     Mobility  Bed Mobility Overal bed mobility: Needs Assistance Bed Mobility: Supine to Sit, Sit to Supine     Supine to sit: Supervision Sit to supine: Supervision        Transfers Overall transfer level: Needs assistance Equipment used: Rolling walker (2 wheels) Transfers: Sit to/from Stand Sit to Stand: Supervision           General transfer comment: for safety, slow to rise    Ambulation/Gait Ambulation/Gait assistance: Supervision Gait Distance (Feet): 250 Feet Assistive device: Rolling walker (2 wheels) Gait Pattern/deviations:  Step-through pattern, Decreased stride length, Trunk flexed Gait velocity: decr     General Gait Details: for safety, cues for proximity of RW and navigating hallways as pt nearly bumping into people and objects   Stairs             Wheelchair Mobility     Tilt Bed    Modified Rankin (Stroke Patients Only)       Balance Overall balance assessment: Needs assistance Sitting-balance support: Feet supported, Bilateral upper extremity supported, No upper extremity supported Sitting balance-Leahy Scale: Fair     Standing balance support: During functional activity, No upper extremity supported Standing balance-Leahy Scale: Fair                              Hotel manager: No apparent difficulties Factors Affecting Communication: Difficulty expressing self (disorganized thoughts)  Cognition Arousal: Alert Behavior During Therapy: WFL for tasks assessed/performed   PT - Cognitive impairments: Attention, Problem solving, Safety/Judgement                         Following commands: Impaired Following commands impaired: Follows one step commands with increased time    Cueing Cueing Techniques: Verbal cues, Tactile cues  Exercises      General Comments        Pertinent Vitals/Pain Pain Assessment Pain Assessment: Faces Faces Pain Scale: Hurts little more Pain Location: Back, R leg/shoulder Pain Descriptors / Indicators: Aching, Discomfort, Sore Pain Intervention(s): Limited activity within patient's tolerance, Monitored during session, Repositioned    Home Living                          Prior Function  PT Goals (current goals can now be found in the care plan section) Acute Rehab PT Goals PT Goal Formulation: With patient Time For Goal Achievement: 03/12/24 Potential to Achieve Goals: Fair Progress towards PT goals: Progressing toward goals    Frequency    Min 3X/week       PT Plan      Co-evaluation              AM-PAC PT "6 Clicks" Mobility   Outcome Measure  Help needed turning from your back to your side while in a flat bed without using bedrails?: A Little Help needed moving from lying on your back to sitting on the side of a flat bed without using bedrails?: A Little Help needed moving to and from a bed to a chair (including a wheelchair)?: A Little Help needed standing up from a chair using your arms (e.g., wheelchair or bedside chair)?: A Little Help needed to walk in hospital room?: A Little Help needed climbing 3-5 steps with a railing? : A Little 6 Click Score: 18    End of Session   Activity Tolerance: Patient tolerated treatment well Patient left: in bed;with call bell/phone within reach;with bed alarm set;with nursing/sitter in room Nurse Communication: Mobility status PT Visit Diagnosis: Other abnormalities of gait and mobility (R26.89);Muscle weakness (generalized) (M62.81);Difficulty in walking, not elsewhere classified (R26.2)     Time: 2440-1027 PT Time Calculation (min) (ACUTE ONLY): 17 min  Charges:    $Gait Training: 8-22 mins PT General Charges $$ ACUTE PT VISIT: 1 Visit                     Marye Round, PT DPT Acute Rehabilitation Services Secure Chat Preferred  Office [phone]    Sharon Huffman Sharon Huffman 03/05/2024, 4:55 PM

## 2024-03-05 NOTE — TOC Progression Note (Addendum)
 Transition of Care [facility]) - Progression Note    Patient Details  Name: Sharon Huffman MRN: [id number] Date of Birth: 11-06-1961  Transition of [facility] CM/SW Contact  Lorri Frederick, LCSW Phone Number: 03/05/2024, 10:21 AM  Clinical Narrative:   Jackqulyn Livings [phone]: updated her that pt no longer has foley.  She does not have any available gero psych beds today but will keep pt on list when one opens up.   1045: Per MD, pt not stable for psych.  TOC will continue to follow.  Expected Discharge Plan: Home/Self Care Barriers to Discharge: Continued Medical Work up  Expected Discharge Plan and Services   Discharge Planning Services: CM Consult   Living arrangements for the past 2 months: Single Family Home                                       Social Determinants of Health (SDOH) Interventions SDOH Screenings   Food Insecurity: No Food Insecurity (02/24/2024)  Housing: Low Risk  (02/24/2024)  Transportation Needs: No Transportation Needs (02/24/2024)  Utilities: Not At Risk (02/24/2024)  Social Connections: Moderately Isolated (02/24/2024)  Tobacco Use: Medium Risk (02/23/2024)    Readmission Risk Interventions     No data to display

## 2024-03-05 NOTE — Progress Notes (Signed)
 [facility] Progress Note Patient: Sharon Huffman:[id number] DOB: Jun 08, 1961 DOA: 02/23/2024  DOS: the patient was seen and examined on 03/05/2024  Brief Hospital Course: PMH of HLD, depression, HTN, hypothyroidism, migraine disease, ADHD presented to hospital with complaints of unresponsive event at home.  Found by husband.  Suspecting intentional substance overdose. Psychiatry was consulted. Recommending inpatient psychiatric admission.   Assessment & Plan: Intentional drug overdose -Likely intentional overdose. -Patient recently noted to have lost her mother who died in her arms per patient and since then patient has felt depressed and etiology of why she took medications however cannot quite tell me what specific medications and how much she took. Currently denying any suicidal or homicidal ideation. Continue one-to-one sitter. Psychiatry consulted. Patient currently voluntary for inpatient psychiatric admission. If the patient tries to leave AMA, psychiatry recommending IVC paperwork.   Aspiration pneumonia Treated with antibiotic. Oxygenation normal.   AKI Likely from poor p.o. intake. As well as ongoing use of diuretic prior to admission. Renal function improved with IV fluid but worsened again on 3/20. Receiving IV fluid again. Monitor.  Urinary retention. Likely due to decreased alertness. Required a Foley catheter, Foley catheter removed on 3/19. Able to urinate while for now. Monitor.  Hyperlipidemia -Statin.   Hypertension Blood pressure soft. Holding medication for now.   ADHD/depression/anxiety/agitation/delirium Had hallucination earlier.  Currently resolved Continue delirium precautions. Per psychiatry recommended inpatient psychiatric hospitalization once patient medically stable.   Nausea and vomiting Resolved.   Migraine headaches -Stable.   Hypothyroidism -Synthroid.    Hypokalemia Replaced.   IBS diarrhea Patient with multiple  watery loose stools on laxatives. Laxatives discontinued and changed to as needed. Diarrhea improved.   Chronic pain, POA Chronically on MS Contin as well as as needed Norco. Will resume home regimen MS Contin 15 mg twice daily. Oxycodone 5 mg every 6 hours as needed breakthrough pain.   Subjective: No acute complaint.  No nausea no vomiting no fever no chills.  No pain right now.  Physical Exam: General: in Mild distress, No Rash Cardiovascular: S1 and S2 Present, No Murmur Respiratory: Good respiratory effort, Bilateral Air entry present. No Crackles, No wheezes Abdomen: Bowel Sound present, No tenderness Extremities: No edema Neuro: Alert and oriented x3, no new focal deficit  Data Reviewed: I have Reviewed nursing notes, Vitals, and Lab results. Since last encounter, pertinent lab results BMP   . I have ordered test including BMP  .   Disposition: Status is: Inpatient Remains inpatient appropriate because: Needing IV fluid for AKI  enoxaparin (LOVENOX) injection 40 mg Start: 02/28/24 2000 SCDs Start: 02/24/24 0113   Family Communication: No one at bedside Level of care: Med-Surg   Vitals:   03/05/24 0024 03/05/24 0314 03/05/24 0800 03/05/24 1814  BP: 121/66 110/60 114/65 119/69  Pulse: (!) 102 (!) 103 99 (!) 107  Resp: 18 17 17 16   Temp: 99.1 F (37.3 C) 98.7 F (37.1 C) 98.6 F (37 C) 99 F (37.2 C)  TempSrc: Oral Oral Oral Oral  SpO2: 96% 96% 99% 96%  Weight:      Height:         Author: Lynden Oxford, MD 03/05/2024 6:18 PM  Please look on [email or web address] to find out who is on call.

## 2024-03-06 LAB — BASIC METABOLIC PANEL
Anion gap: 11 (ref 5–15)
BUN: 20 mg/dL (ref 8–23)
CO2: 23 mmol/L (ref 22–32)
Calcium: 8.9 mg/dL (ref 8.9–10.3)
Chloride: 105 mmol/L (ref 98–111)
Creatinine, Ser: 1.28 mg/dL — ABNORMAL HIGH (ref 0.44–1.00)
GFR, Estimated: 47 mL/min — ABNORMAL LOW (ref >60)
Glucose, Bld: 109 mg/dL — ABNORMAL HIGH (ref 70–99)
Potassium: 4.1 mmol/L (ref 3.5–5.1)
Sodium: 139 mmol/L (ref 135–145)

## 2024-03-06 LAB — MAGNESIUM: Magnesium: 1.8 mg/dL (ref 1.7–2.4)

## 2024-03-06 MED ORDER — LOPERAMIDE HCL 2 MG PO CAPS
2.0000 mg | ORAL_CAPSULE | ORAL | Status: AC | PRN
  Administered 2024-03-06 – 2024-03-07 (×2): 2 mg via ORAL
  Filled 2024-03-06 (×2): qty 1

## 2024-03-06 MED ORDER — LACTATED RINGERS IV SOLN
INTRAVENOUS | Status: DC
Start: 2024-03-06 — End: 2024-03-07

## 2024-03-06 NOTE — Plan of Care (Signed)

## 2024-03-06 NOTE — Plan of Care (Signed)
  Problem: Education: Goal: Knowledge of General Education information will improve Description: Including pain rating scale, medication(s)/side effects and non-pharmacologic comfort measures 03/06/2024 0147 by Duffy Bruce, RN Outcome: Progressing 03/06/2024 0147 by Duffy Bruce, RN Outcome: Progressing   Problem: Activity: Goal: Risk for activity intolerance will decrease 03/06/2024 0147 by Duffy Bruce, RN Outcome: Progressing 03/06/2024 0147 by Duffy Bruce, RN Outcome: Progressing   Problem: Nutrition: Goal: Adequate nutrition will be maintained 03/06/2024 0147 by Duffy Bruce, RN Outcome: Progressing 03/06/2024 0147 by Duffy Bruce, RN Outcome: Progressing   Problem: Coping: Goal: Level of anxiety will decrease 03/06/2024 0147 by Duffy Bruce, RN Outcome: Progressing 03/06/2024 0147 by Duffy Bruce, RN Outcome: Progressing   Problem: Elimination: Goal: Will not experience complications related to bowel motility 03/06/2024 0147 by Duffy Bruce, RN Outcome: Progressing 03/06/2024 0147 by Duffy Bruce, RN Outcome: Progressing Goal: Will not experience complications related to urinary retention 03/06/2024 0147 by Duffy Bruce, RN Outcome: Progressing 03/06/2024 0147 by Duffy Bruce, RN Outcome: Progressing   Problem: Pain Managment: Goal: General experience of comfort will improve and/or be controlled 03/06/2024 0147 by Duffy Bruce, RN Outcome: Progressing 03/06/2024 0147 by Duffy Bruce, RN Outcome: Progressing   Problem: Safety: Goal: Ability to remain free from injury will improve 03/06/2024 0147 by Duffy Bruce, RN Outcome: Progressing 03/06/2024 0147 by Duffy Bruce, RN Outcome: Progressing   Problem: Skin Integrity: Goal: Risk for impaired skin integrity will decrease 03/06/2024 0147 by Duffy Bruce, RN Outcome: Progressing 03/06/2024 0147 by Duffy Bruce, RN Outcome: Progressing

## 2024-03-06 NOTE — Progress Notes (Signed)
 Mobility Specialist Progress Note:    03/06/24 1403  Mobility  Activity Ambulated with assistance in hallway  Level of Assistance Contact guard assist, steadying assist  Assistive Device Front wheel walker  Distance Ambulated (ft) 150 ft  Activity Response Tolerated well  Mobility Referral Yes  Mobility visit 1 Mobility  Mobility Specialist Start Time (ACUTE ONLY) 1220  Mobility Specialist Stop Time (ACUTE ONLY) 1232  Mobility Specialist Time Calculation (min) (ACUTE ONLY) 12 min   Pt received in bed eager for mobility. No physical assistance required, contact guard for safety. C/o mild fatigue otherwise no c/o. Returned to room w/o fault. Situated in bed w/ call bell and personal belongings in reach. All needs met. Sitter in room.  Thompson Grayer Mobility Specialist  Please contact vis Secure Chat or  Rehab Office [phone]

## 2024-03-06 NOTE — Progress Notes (Signed)
 [facility] Progress Note Patient: Sharon Huffman ZOX:[id number] DOB: 1961-01-09 DOA: 02/23/2024  DOS: the patient was seen and examined on 03/06/2024  Brief Hospital Course: PMH of HLD, depression, HTN, hypothyroidism, migraine disease, ADHD presented to hospital with complaints of unresponsive event at home.  Found by husband.  Suspecting intentional substance overdose. Psychiatry was consulted. Recommending inpatient psychiatric admission.   Assessment & Plan: Intentional drug overdose -Likely intentional overdose. -Patient recently noted to have lost her mother who died in her arms per patient and since then patient has felt depressed and etiology of why she took medications however cannot quite tell me what specific medications and how much she took. Currently denying any suicidal or homicidal ideation. Continue one-to-one sitter. Psychiatry consulted. Patient currently voluntary for inpatient psychiatric admission. If the patient tries to leave AMA, psychiatry recommending IVC paperwork.   Aspiration pneumonia Treated with antibiotic. Oxygenation normal.   AKI Likely from poor p.o. intake. As well as ongoing use of diuretic prior to admission. Renal function improved with IV fluid but worsened again on 3/20. Receiving IV fluid again.  Recommend oral hydration.  Will reduce the rate. Monitor.  Urinary retention. Likely due to decreased alertness. Required a Foley catheter, Foley catheter removed on 3/19. Able to urinate while for now. Monitor.  Hyperlipidemia -Statin.   Hypertension Blood pressure soft. Holding medication for now.   ADHD/depression/anxiety/agitation/delirium Had hallucination earlier.  Currently resolved Continue delirium precautions. Per psychiatry recommended inpatient psychiatric hospitalization once patient medically stable.   Nausea and vomiting Resolved.   Migraine headaches -Stable.   Hypothyroidism -Synthroid.     Hypokalemia Replaced.   IBS diarrhea Patient with multiple watery loose stools on laxatives. Laxatives discontinued and changed to as needed. Diarrhea improved.   Chronic pain, POA Chronically on MS Contin as well as as needed Norco. Will resume home regimen MS Contin 15 mg twice daily. Oxycodone 5 mg every 6 hours as needed breakthrough pain.  Bilateral shoulder pain. X-ray performed. Shows evidence of osteophytes. Will monitor for    Subjective: No nausea no vomiting no fever no chills.  Physical Exam: General: in Mild distress, No Rash Cardiovascular: S1 and S2 Present, No Murmur Respiratory: Good respiratory effort, Bilateral Air entry present. No Crackles, No wheezes Abdomen: Bowel Sound present, No tenderness Extremities: No edema Neuro: Alert and oriented x3, no new focal deficit  Data Reviewed: I have Reviewed nursing notes, Vitals, and Lab results. Since last encounter, pertinent lab results BMP   . I have ordered test including BMP  .   Disposition: Status is: Inpatient Remains inpatient appropriate because: Requiring IV hydration.  enoxaparin (LOVENOX) injection 40 mg Start: 02/28/24 2000 SCDs Start: 02/24/24 0113   Family Communication: No one at bedside Level of care: Med-Surg   Vitals:   03/06/24 0547 03/06/24 0700 03/06/24 1700 03/06/24 1924  BP: 121/62 115/75 118/64 131/67  Pulse: (!) 106 (!) 110 98 100  Resp: 15 18 16 17   Temp: 99 F (37.2 C) 98.3 F (36.8 C) 98.6 F (37 C) 98.5 F (36.9 C)  TempSrc: Oral Oral Oral Oral  SpO2: 96% 99% 100% 100%  Weight:      Height:         Author: Lynden Oxford, MD 03/06/2024 7:51 PM  Please look on [email or web address] to find out who is on call.

## 2024-03-06 NOTE — Consult Note (Signed)
 [facility] Psychiatric Consult Follow up  Patient Name: .Sharon Huffman  MRN: [id number]  DOB: [birth year]  Consult Order details:  Orders (From admission, onward)     Start     Ordered   02/24/24 1553  IP CONSULT TO PSYCHIATRY       Ordering Provider: Barnetta Chapel, MD  Provider:  (Not yet assigned)  Question Answer Comment  Location [facility]   Reason for Consult? intentional drug overdose      02/24/24 1552             Mode of Visit: In person    Psychiatry Consult Evaluation  Service Date: March 06, 2024 LOS:  LOS: 11 days  Chief Complaint "I just need to get over saying that.  I feel pretty good.  I'm assuming I tried to kill myself because I ended up in the hospital for five months," and then confusion.  Primary Psychiatric Diagnoses  Major depressive disorder, recurrent, severe  2.  Delirium 3.  General anxiety d/o  Assessment  Sharon Huffman is a 63 y.o. female admitted: Medicallyfor 02/23/2024  7:22 PM for intentional overdose. She carries the psychiatric diagnoses of MDD and GAD and has a past medical history of asthma, hypothyroidism, AKI, chronic fatigue., and hyperlipidemia   Her current presentation of intermittent confusion with agitation is most consistent with delirium. She meets criteria for MDD based on grief issues, difficulty coping, and suicide attempt.  Current outpatient psychotropic medications include Xanax, Prozac, and Adderall and historically she has had a low response to these medications. She was compliant with medications prior to admission as evidenced by husband's report except she was overtaking them. On initial examination, patient had a brief moment of clarity but mostly confused. Please see plan below for detailed recommendations.   02/29/2024: Her cognition remains the same with pleasant confusion and restlessness, constantly trying to get out of the bed, restraints in place.  Rambles with irrelevant sentences, consistent  with delirium.  Her sitter reported that she did not sleep well last night.  She did eat her breakfast and had a bath without issues, no aggression.  The client continues to talk to people who are not there.  Consulted with the psychiatrist and medications changed to assist, hopefully.  03/02/24: On reassessment today, patient found, resting calmly bed with her eyes closed but responsive to conversation. Had breakfast this morning and on behavioral issues reported. She was able to report and describe her headache but the conversation thereafter remained disorganized and largely tangential. She believed that she was at a hotel and was unable to answer orientation questions. Patient continues to exhibit what appeared to be a waxing and waning presentation which is consistent with delirium. Patient was gently reoriented to reality. Tylenol was ordered for headache.   03/04/24: Patient presents calm and pleasant with a brighter affect this morning. Cognition seems to have greatly improved since our last encounter on Monday. Today's conversation is linear, logical and goal directed. She is alert and oriented to person, place, time and situation. Patient has been able to reflect on what appears to have triggered the suicide attempts ( death of her mother) and she is now processing the situation logically. While she is improving, she also recognizes that she requires further psychiatric stabilization in a psychiatric hospital. Patient denied SI/HI, plan or intent to harm and also denied hallucinations in all modalities. Her husband who was at her bedside also agrees with the plan. We recommend  a referrer to psychiatric inpatient hospital once she is medically cleared.   03/06/24: met with patient for reassessment this morning. Found her in bed speaking on the telephone. Conversation was linear, logical and goal directed. She denies SI/HI, but still expressing difficulty dealing with her mother's death and continues to  show interest in pursuing inpatient psychiatry. Patient is currently being monitored and treated fro high creatinine. Current creatinine level today is 1.28 from 1.56 yesterday. She is ambulating with minimal assistance. Patient is not medically cleared at this time, but will be referred to inpatient psych once medically cleared.    Diagnoses:  Active Hospital problems: Principal Problem:   Overdose Active Problems:   Depression   Hypophosphatemia   Hyperkalemia   Hypomagnesemia   ADHD   Aspiration pneumonia (HCC)   Nausea and vomiting   Dehydration   AKI (acute kidney injury) (HCC)   Hyperlipidemia   Hypothyroidism   Major depressive disorder, recurrent severe without psychotic features (HCC)   Acute urinary retention   Hypokalemia   Chronic pain    Plan   ## Psychiatric Medication Recommendations:  Continue Xanax 0.5 mg BID  Continue gabapentin 100 mg BID with potential to increase this Continue Zyprexa 2.5 mg BID Continue Lexapro 5 mg daily   ## Medical Decision Making Capacity: Not specifically addressed in this encounter  ## Further Work-up:  -- most recent EKG on 03/04/2024 had QtC of 457 -- Pertinent labwork reviewed earlier this admission includes: CMP, CBC with diff, UDS, and U/A -- Elevated creatinine of 1.56 but repeated level on 3/21- 1.28    ## Disposition:-- We recommend inpatient psychiatric hospitalization when medically cleared. Patient is under voluntary admission status at this time; please IVC if attempts to leave hospital.  ## Behavioral / Environmental: -Delirium Precautions: Delirium Interventions for Nursing and Staff: - RN to open blinds every AM. - To Bedside: Glasses, hearing aide, and pt's own shoes. Make available to patients. when possible and encourage use. - Encourage po fluids when appropriate, keep fluids within reach. - OOB to chair with meals. - Passive ROM exercises to all extremities with AM & PM care. - RN to assess orientation to  person, time and place QAM and PRN. - Recommend extended visitation hours with familiar family/friends as feasible. - Staff to minimize disturbances at night. Turn off television when pt asleep or when not in use.    ## Safety and Observation Level:  - Based on my clinical evaluation, I estimate the patient to be at moderate risk of self harm in the current setting. - At this time, we recommend  routine. This decision is based on my review of the chart including patient's history and current presentation, interview of the patient, mental status examination, and consideration of suicide risk including evaluating suicidal ideation, plan, intent, suicidal or self-harm behaviors, risk factors, and protective factors. This judgment is based on our ability to directly address suicide risk, implement suicide prevention strategies, and develop a safety plan while the patient is in the clinical setting. Please contact our team if there is a concern that risk level has changed.  CSSR Risk Category:C-SSRS RISK CATEGORY: No Risk  Suicide Risk Assessment: Patient has following modifiable risk factors for suicide: under treated depression , which we are addressing by adjusting medications. Patient has following non-modifiable or demographic risk factors for suicide: history of suicide attempt Patient has the following protective factors against suicide: Access to outpatient mental health care and Supportive family  Thank you for  this consult request. Recommendations have been communicated to the primary team.  We will continue to follow at this time.   Marcell Anger, NP       History of Present Illness  Relevant Aspects of [facility] Course:  Admitted on 02/23/2024 for intentional overdose. They are delirious with some improvement today.   Patient Report:  Patient seen this morning in her room, she was observed ambulating to the bathroom with walker. Patient stated that she realized that she has not  being "the nicest person and I feel embarrassed for my behavior last week." Noted that she was in a dark place when her mother died but she is gradually coming to terms with it. Patient mentioned that she single handedly cared for her ailing mother and she "tried my best." Patient acknowledged the presence of her husband who was at her bedside, stating that she "felt grateful and blessed" to have his support.   Concerning continuity of care, she acknowledged the need for further psychiatric stabilization in an inpatient hospital. Patient denied SI/HI, plan or intent to harm and also denied hallucinations in all modalities.   Husband was present at bedside and he noted that patient has being improving for the past few days.   03/06/24: Patient reports a restful night but still experiencing generalized pain. States that her mood has greatly improved but still having some difficulty dealing with her mother's death. She denies SI/HI, plan and intent to harm and reports that she regrets the suicide attempt and she feels bad for "doing that to my husband." he acknowledged the need for further psychiatric stabilization in an inpatient hospital.   Psych ROS:  Depression: present Anxiety:  high Mania (lifetime and current): UTA Psychosis: (lifetime and current): hallucinating  Collateral information:  Her husband at her bedside reported she was "more subdued" and less agitated. "She kinda has some data at times, facts are buried with the confusion". He does feel she is doing better physically without so many medications , especially with opiates. Contributes the client to downward spiraling the death of her mother 5 weeks ago that she was caring for. This is when she started over taking her medications.   Review of Systems  Neurological:  Positive for sensory change.  Psychiatric/Behavioral:  Positive for hallucinations and memory loss. The patient is nervous/anxious.      Psychiatric and Social History   Psychiatric History:  Information collected from patient, chart, sitter, and husband (3/12)  Prev Dx/Sx: MDD, grief, GAD, ADHD Current Psych Provider: [facility] Meds (current): Xanax, Prozac, Adderall Previous Med Trials: unknown Therapy: UTA  Prior Psych Hospitalization: none  Prior Self Harm: UTA Prior Violence: UTA  Family Psych History: UTA Family Hx suicide: UTA  Social History:  Living Situation: lives with her husband  Access to weapons/lethal means: none   Substance History UTA Her husband reported she was overtaking her medication at home   Exam Findings  Physical Exam:  Vital Signs:  Temp:  [98.3 F (36.8 C)-99.6 F (37.6 C)] 98.3 F (36.8 C) (03/21 0700) Pulse Rate:  [104-110] 110 (03/21 0700) Resp:  [15-18] 18 (03/21 0700) BP: (115-132)/(62-75) 115/75 (03/21 0700) SpO2:  [96 %-99 %] 99 % (03/21 0700) Blood pressure 115/75, pulse (!) 110, temperature 98.3 F (36.8 C), temperature source Oral, resp. rate 18, height 5\' 5"  (1.651 m), weight 65.3 kg, SpO2 99%. Body mass index is 23.96 kg/m.  Physical Exam Vitals and nursing note reviewed.  HENT:  Head: Normocephalic.     Nose: Nose normal.  Pulmonary:     Effort: Pulmonary effort is normal.  Musculoskeletal:     Cervical back: Normal range of motion.  Neurological:     Mental Status: She is alert.  Psychiatric:        Mood and Affect: Mood normal.        Behavior: Behavior normal.     Mental Status Exam: General Appearance: Neat and hospital gown  Orientation:  person, place, time and situation  Memory:  Immediate;   Good Recent;   Good Remote;   Good  Concentration:  Concentration: Good and Attention Span: Good  Recall:  Good  Attention  Good  Eye Contact:  Good  Speech:  Normal Rate   Language:  Good  Volume:  Normal  Mood: "Getting better"  Affect:   Bright  Thought Process:  Coherent, Goal Directed, and Linear  Thought Content:  Logical and Rumination  Suicidal  Thoughts:  No, denies  Homicidal Thoughts: Denies  Judgement:  Good  Insight:  Fair  Psychomotor Activity:  Normal  Akathisia:  No  Fund of Knowledge:  Fair      Assets:  Housing Leisure Time Resilience Social Support  Cognition:  WNL  ADL's:  Intact with minimal assistance  AIMS (if indicated):        Other History   These have been pulled in through the EMR, reviewed, and updated if appropriate.  Family History:  The patient's family history includes Dementia in her mother; Heart attack in her father; Heart disease in her father; Hyperlipidemia in her mother; Hypertension in her mother.  Medical History: Past Medical History:  Diagnosis Date   ADHD    Anxiety    Bronchitis, chronic (HCC)    Cervical cancer (HCC)    Depression    Depression    Hyperlipidemia    Hypertension    Insomnia disorder    Lyme disease    Migraine    Ovarian cancer (HCC)    Palpitations     Surgical History: Past Surgical History:  Procedure Laterality Date   ABDOMINAL HYSTERECTOMY  1994   ANTERIOR CERVICAL DECOMP/DISCECTOMY FUSION  2000   AUGMENTATION MAMMAPLASTY Bilateral 1998   BREAST ENHANCEMENT SURGERY Bilateral    EYE SURGERY  2016   PVD     Medications:   Current Facility-Administered Medications:    ALPRAZolam (XANAX) tablet 0.5 mg, 0.5 mg, Oral, BID, Lord, Jamison Y, NP, 0.5 mg at 03/06/24 0800   aspirin EC tablet 81 mg, 81 mg, Oral, Daily, Skip Mayer A, MD, 81 mg at 03/06/24 0800   Chlorhexidine Gluconate Cloth 2 % PADS 6 each, 6 each, Topical, Q0600, Albertine Grates, MD, 6 each at 03/06/24 0803   enoxaparin (LOVENOX) injection 40 mg, 40 mg, Subcutaneous, Q24H, Rodolph Bong, MD, 40 mg at 03/05/24 2042   escitalopram (LEXAPRO) tablet 5 mg, 5 mg, Oral, Daily, Rodolph Bong, MD, 5 mg at 03/06/24 0800   famotidine (PEPCID) tablet 20 mg, 20 mg, Oral, QHS, Rodolph Bong, MD, 20 mg at 03/05/24 2040   gabapentin (NEURONTIN) capsule 100 mg, 100 mg, Oral, BID,  Lord, Jamison Y, NP, 100 mg at 03/06/24 0800   Gerhardt's butt cream, , Topical, PRN, Rodolph Bong, MD, Given at 03/03/24 1744   lactated ringers infusion, , Intravenous, Continuous, Rolly Salter, MD, Last Rate: 125 mL/hr at 03/06/24 0159, Restarted at 03/06/24 0159   levothyroxine (SYNTHROID) tablet 50 mcg, 50 mcg,  Oral, QAC breakfast, Rodolph Bong, MD, 50 mcg at 03/06/24 0543   midodrine (PROAMATINE) tablet 5 mg, 5 mg, Oral, TID WC, Rolly Salter, MD, 5 mg at 03/06/24 0758   morphine (MS CONTIN) 12 hr tablet 15 mg, 15 mg, Oral, Q12H, Rodolph Bong, MD, 15 mg at 03/06/24 0800   multivitamin with minerals tablet 1 tablet, 1 tablet, Oral, Q24H, Lord, Jamison Y, NP, 1 tablet at 03/05/24 1148   OLANZapine (ZYPREXA) tablet 2.5 mg, 2.5 mg, Oral, QHS, Hebishi, Galen Manila, MD, 2.5 mg at 03/05/24 2040   oxyCODONE (Oxy IR/ROXICODONE) immediate release tablet 5 mg, 5 mg, Oral, Q8H PRN, Rodolph Bong, MD, 5 mg at 03/06/24 0757   pantoprazole (PROTONIX) EC tablet 40 mg, 40 mg, Oral, Daily, Rodolph Bong, MD, 40 mg at 03/06/24 0800   polyethylene glycol (MIRALAX / GLYCOLAX) packet 17 g, 17 g, Oral, Daily PRN, Rodolph Bong, MD   rosuvastatin (CRESTOR) tablet 10 mg, 10 mg, Oral, Daily, Rodolph Bong, MD, 10 mg at 03/06/24 0800   senna-docusate (Senokot-S) tablet 1 tablet, 1 tablet, Oral, QHS PRN, Rodolph Bong, MD   sodium chloride flush (NS) 0.9 % injection 10-40 mL, 10-40 mL, Intracatheter, Q12H, Rodolph Bong, MD, 10 mL at 03/06/24 0802   sodium chloride flush (NS) 0.9 % injection 10-40 mL, 10-40 mL, Intracatheter, PRN, Rodolph Bong, MD   tamsulosin [facility]) capsule 0.4 mg, 0.4 mg, Oral, QPC breakfast, Rolly Salter, MD, 0.4 mg at 03/06/24 4098   thiamine (VITAMIN B1) tablet 100 mg, 100 mg, Oral, Daily, Nanine Means Y, NP, 100 mg at 03/06/24 0800  Allergies: Allergies  Allergen Reactions   Amlodipine Swelling and Other (See Comments)    Leg edema  and fluid retention   Atorvastatin Other (See Comments)    Other reaction(s): back ache   Bystolic [Nebivolol Hcl] Other (See Comments)    sweat and get clammy   Carbamazepine Other (See Comments)    rash   Mirtazapine Other (See Comments)    Other reaction(s): edema, vivid nightmares, patient was unsure    Penicillins Nausea And Vomiting   Sulfacetamide Nausea Only    Avanell Banwart, NP

## 2024-03-06 NOTE — Progress Notes (Signed)
 SLP Cancellation Note  Patient Details Name: Sharon Huffman MRN: [id number] DOB: Sep 02, 1961   Cancelled treatment:        SLP attempted to conduct assessment of diet tolerance with meal per recommendations from prior ST sessions. Entered near breakfast time, however patient had just finished breakfast and not agreeable to further PO trials. NT sitter present in room, stating she can call to schedule lunch for a specific time. Planned for 1 PM, however when SLP arrived at 1, sitter reports lunch arrived 1.5 hours earlier than indicated and patient ate it upon arrival. SLP will continue attempts as able.  Jeannie Done, M.A., CCC-SLP  Yetta Barre 03/06/2024, 1:07 PM

## 2024-03-07 LAB — BASIC METABOLIC PANEL
Anion gap: 4 — ABNORMAL LOW (ref 5–15)
BUN: 17 mg/dL (ref 8–23)
CO2: 25 mmol/L (ref 22–32)
Calcium: 8.2 mg/dL — ABNORMAL LOW (ref 8.9–10.3)
Chloride: 107 mmol/L (ref 98–111)
Creatinine, Ser: 1.08 mg/dL — ABNORMAL HIGH (ref 0.44–1.00)
GFR, Estimated: 57 mL/min — ABNORMAL LOW (ref >60)
Glucose, Bld: 108 mg/dL — ABNORMAL HIGH (ref 70–99)
Potassium: 3.6 mmol/L (ref 3.5–5.1)
Sodium: 136 mmol/L (ref 135–145)

## 2024-03-07 LAB — MAGNESIUM: Magnesium: 1.9 mg/dL (ref 1.7–2.4)

## 2024-03-07 MED ORDER — VITAMIN B-1 100 MG PO TABS: 100.0000 mg | ORAL_TABLET | Freq: Every day | ORAL | Status: DC

## 2024-03-07 MED ORDER — ADULT MULTIVITAMIN W/MINERALS CH: 1.0000 | ORAL_TABLET | ORAL | Status: DC

## 2024-03-07 MED ORDER — ESCITALOPRAM OXALATE 10 MG PO TABS
10.0000 mg | ORAL_TABLET | Freq: Every day | ORAL | Status: DC
  Administered 2024-03-08 – 2024-03-10 (×3): 10 mg via ORAL
  Filled 2024-03-07 (×3): qty 1

## 2024-03-07 MED ORDER — ASPIRIN 81 MG PO TBEC: 81.0000 mg | DELAYED_RELEASE_TABLET | Freq: Every day | ORAL | Status: DC

## 2024-03-07 MED ORDER — GABAPENTIN 100 MG PO CAPS: 100.0000 mg | ORAL_CAPSULE | Freq: Two times a day (BID) | ORAL | Status: DC

## 2024-03-07 MED ORDER — ESCITALOPRAM OXALATE 10 MG PO TABS
5.0000 mg | ORAL_TABLET | Freq: Once | ORAL | Status: AC
  Administered 2024-03-07: 5 mg via ORAL
  Filled 2024-03-07: qty 1

## 2024-03-07 MED ORDER — ESCITALOPRAM OXALATE 10 MG PO TABS
10.0000 mg | ORAL_TABLET | Freq: Every day | ORAL | Status: DC
Start: 2024-03-08 — End: 2024-03-10

## 2024-03-07 MED ORDER — ACETAMINOPHEN-CAFFEINE 500-65 MG PO TABS
1.0000 | ORAL_TABLET | Freq: Once | ORAL | Status: AC
  Administered 2024-03-07: 2 via ORAL
  Filled 2024-03-07: qty 2

## 2024-03-07 MED ORDER — OLANZAPINE 2.5 MG PO TABS: 2.5000 mg | ORAL_TABLET | Freq: Every day | ORAL | Status: DC

## 2024-03-07 NOTE — Consult Note (Signed)
 [facility] Psychiatric Consult Follow up  Patient Name: .Sharon Huffman  MRN: [id number]  DOB: 10/12/1961  Consult Order details:  Orders (From admission, onward)     Start     Ordered   02/24/24 1553  IP CONSULT TO PSYCHIATRY       Ordering Provider: Barnetta Chapel, MD  Provider:  (Not yet assigned)  Question Answer Comment  Location [facility]   Reason for Consult? intentional drug overdose      02/24/24 1552             Mode of Visit: In person    Psychiatry Consult Evaluation  Service Date: March 07, 2024 LOS:  LOS: 12 days  Chief Complaint "I just need to get over saying that.  I feel pretty good.  I'm assuming I tried to kill myself because I ended up in the hospital for five months," and then confusion.  Primary Psychiatric Diagnoses  Major depressive disorder, recurrent, severe  2.  Delirium 3.  General anxiety d/o  Assessment  Sharon Huffman is a 63 y.o. female admitted: Medicallyfor 02/23/2024  7:22 PM for intentional overdose. She carries the psychiatric diagnoses of MDD and GAD and has a past medical history of asthma, hypothyroidism, AKI, chronic fatigue., and hyperlipidemia   Her current presentation of intermittent confusion with agitation is most consistent with delirium. She meets criteria for MDD based on grief issues, difficulty coping, and suicide attempt.  Current outpatient psychotropic medications include Xanax, Prozac, and Adderall and historically she has had a low response to these medications. She was compliant with medications prior to admission as evidenced by husband's report except she was overtaking them. On initial examination, patient had a brief moment of clarity but mostly confused. Please see plan below for detailed recommendations.   02/29/2024: Her cognition remains the same with pleasant confusion and restlessness, constantly trying to get out of the bed, restraints in place.  Rambles with irrelevant sentences, consistent  with delirium.  Her sitter reported that she did not sleep well last night.  She did eat her breakfast and had a bath without issues, no aggression.  The client continues to talk to people who are not there.  Consulted with the psychiatrist and medications changed to assist, hopefully.  03/02/24: On reassessment today, patient found, resting calmly bed with her eyes closed but responsive to conversation. Had breakfast this morning and on behavioral issues reported. She was able to report and describe her headache but the conversation thereafter remained disorganized and largely tangential. She believed that she was at a hotel and was unable to answer orientation questions. Patient continues to exhibit what appeared to be a waxing and waning presentation which is consistent with delirium. Patient was gently reoriented to reality. Tylenol was ordered for headache.   03/04/24: Patient presents calm and pleasant with a brighter affect this morning. Cognition seems to have greatly improved since our last encounter on Monday. Today's conversation is linear, logical and goal directed. She is alert and oriented to person, place, time and situation. Patient has been able to reflect on what appears to have triggered the suicide attempts ( death of her mother) and she is now processing the situation logically. While she is improving, she also recognizes that she requires further psychiatric stabilization in a psychiatric hospital. Patient denied SI/HI, plan or intent to harm and also denied hallucinations in all modalities. Her husband who was at her bedside also agrees with the plan. We recommend  a referrer to psychiatric inpatient hospital once she is medically cleared.   03/06/24: met with patient for reassessment this morning. Found her in bed speaking on the telephone. Conversation was linear, logical and goal directed. She denies SI/HI, but still expressing difficulty dealing with her mother's death and continues to  show interest in pursuing inpatient psychiatry. Patient is currently being monitored and treated fro high creatinine. Current creatinine level today is 1.28 from 1.56 yesterday. She is ambulating with minimal assistance. Patient is not medically cleared at this time, but will be referred to inpatient psych once medically cleared.   03/07/24: On reassessment today, patient found ambulating from the bathroom with a walker. She presents with a big and welcoming smile. Patient reports a restful night. Although she denies having suicidal ideation, plan or intent at this time, however she is still struggling with the death of her mother. She ruminates about their times together and about her death. Patient agrees to that fact that she needs further mental health stabilization and particularly grief support, and she believes that her neighbors and nurses she met here at the hospital will provide her with this.    While she appears to have been demonstrating an improved insight about her presenting problem, she also seems to have an unrealistic expectations of others that may not be available to her when she discharges. Patient was educated on the need for inpatient psychiatric admission to maintain safety and stabilization and for proper discharge planning and outpatient follow up. She reluctantly agrees with the plan at this time. We will increase Lexapro to 10 mg daily as she appears to be stable on it and without side effect. Creatinine level is trending down today 3/22 at 1.08. We recommend inpatient hospitalization when medically stable.      Diagnoses:  Active Hospital problems: Principal Problem:   Overdose Active Problems:   Depression   Hypophosphatemia   Hyperkalemia   Hypomagnesemia   ADHD   Aspiration pneumonia (HCC)   Nausea and vomiting   Dehydration   AKI (acute kidney injury) (HCC)   Hyperlipidemia   Hypothyroidism   Major depressive disorder, recurrent severe without psychotic  features (HCC)   Acute urinary retention   Hypokalemia   Chronic pain    Plan   ## Psychiatric Medication Recommendations:  Continue Xanax 0.5 mg BID  Continue gabapentin 100 mg BID with potential to increase this Continue Zyprexa 2.5 mg BID Increase to Lexapro 10 mg daily   ## Medical Decision Making Capacity: Not specifically addressed in this encounter  ## Further Work-up:  -- most recent EKG on 03/04/2024 had QtC of 457 -- Pertinent labwork reviewed earlier this admission includes: CMP, CBC with diff, UDS, and U/A -- Elevated creatinine of 1.56 but repeated level on 3/21- 1.28  -- Creatinine level is trending down today 3/22 at 1.08.    ## Disposition:-- We recommend inpatient psychiatric hospitalization when medically cleared. Patient is under voluntary admission status at this time; please IVC if attempts to leave hospital.  ## Behavioral / Environmental: -Delirium Precautions: Delirium Interventions for Nursing and Staff: - RN to open blinds every AM. - To Bedside: Glasses, hearing aide, and pt's own shoes. Make available to patients. when possible and encourage use. - Encourage po fluids when appropriate, keep fluids within reach. - OOB to chair with meals. - Passive ROM exercises to all extremities with AM & PM care. - RN to assess orientation to person, time and place QAM and PRN. - Recommend extended  visitation hours with familiar family/friends as feasible. - Staff to minimize disturbances at night. Turn off television when pt asleep or when not in use.    ## Safety and Observation Level:  - Based on my clinical evaluation, I estimate the patient to be at moderate risk of self harm in the current setting. - At this time, we recommend  routine. This decision is based on my review of the chart including patient's history and current presentation, interview of the patient, mental status examination, and consideration of suicide risk including evaluating suicidal ideation, plan,  intent, suicidal or self-harm behaviors, risk factors, and protective factors. This judgment is based on our ability to directly address suicide risk, implement suicide prevention strategies, and develop a safety plan while the patient is in the clinical setting. Please contact our team if there is a concern that risk level has changed.  CSSR Risk Category:C-SSRS RISK CATEGORY: No Risk  Suicide Risk Assessment: Patient has following modifiable risk factors for suicide: under treated depression , which we are addressing by adjusting medications. Patient has following non-modifiable or demographic risk factors for suicide: history of suicide attempt Patient has the following protective factors against suicide: Access to outpatient mental health care and Supportive family  Thank you for this consult request. Recommendations have been communicated to the primary team.  We will continue to follow at this time.   Marcell Anger, NP       History of Present Illness  Relevant Aspects of [facility] Course:  Admitted on 02/23/2024 for intentional overdose. They are delirious with some improvement today.   Patient Report:  Patient seen this morning in her room, she was observed ambulating to the bathroom with walker. Patient stated that she realized that she has not being "the nicest person and I feel embarrassed for my behavior last week." Noted that she was in a dark place when her mother died but she is gradually coming to terms with it. Patient mentioned that she single handedly cared for her ailing mother and she "tried my best." Patient acknowledged the presence of her husband who was at her bedside, stating that she "felt grateful and blessed" to have his support.   Concerning continuity of care, she acknowledged the need for further psychiatric stabilization in an inpatient hospital. Patient denied SI/HI, plan or intent to harm and also denied hallucinations in all modalities.   Husband was  present at bedside and he noted that patient has being improving for the past few days.   03/06/24: Patient reports a restful night but still experiencing generalized pain. States that her mood has greatly improved but still having some difficulty dealing with her mother's death. She denies SI/HI, plan and intent to harm and reports that she regrets the suicide attempt and she feels bad for "doing that to my husband." he acknowledged the need for further psychiatric stabilization in an inpatient hospital.   03/07/24: Patient reports feeling "wonderful" today. She had just returned from walking with the physical therapist. Notes that she is not experiencing any pain today and has been in a "very good mood" which she attributes to a restful night and the dream she had about her late mother. She describes the dream as "comforting" , stating that her mother told her not to grief about her anymore and to take care of herself. Patient again continues to reminisce about the good times she had with her mother and that she was her best friend. On moving forward with treatment  plan, patient is now having a second thought about inpatient psychiatry because she misses her home, husband and her pets. She describes what appears to be an elaborate plan where her neighbours would check up on her and that a few nurses here had promised to come by her home to check up on her.      Psych ROS:  Depression:  some sadness, ruminating on mother's death Anxiety:  Occasionally  Mania (lifetime and current): Denies Psychosis: (lifetime and current): Not present  Collateral information:  Her husband at her bedside reported she was "more subdued" and less agitated. "She kinda has some data at times, facts are buried with the confusion". He does feel she is doing better physically without so many medications , especially with opiates. Contributes the client to downward spiraling the death of her mother 5 weeks ago that she was  caring for. This is when she started over taking her medications.   Review of Systems  Neurological:  Positive for sensory change.  Psychiatric/Behavioral:  Positive for depression. Negative for hallucinations, substance abuse and suicidal ideas. The patient is not nervous/anxious and does not have insomnia.      Psychiatric and Social History  Psychiatric History:  Information collected from patient, chart, sitter, and husband (3/12)  Prev Dx/Sx: MDD, grief, GAD, ADHD Current Psych Provider: [facility] Meds (current): Xanax, Prozac, Adderall Previous Med Trials: unknown Therapy: UTA  Prior Psych Hospitalization: none  Prior Self Harm: UTA Prior Violence: UTA  Family Psych History: UTA Family Hx suicide: UTA  Social History:  Living Situation: lives with her husband  Access to weapons/lethal means: none   Substance History UTA Her husband reported she was overtaking her medication at home   Exam Findings  Physical Exam:  Vital Signs:  Temp:  [98.2 F (36.8 C)-98.6 F (37 C)] 98.2 F (36.8 C) (03/22 0806) Pulse Rate:  [84-100] 84 (03/22 0806) Resp:  [15-17] 15 (03/22 0806) BP: (118-138)/(64-86) 138/67 (03/22 0806) SpO2:  [99 %-100 %] 99 % (03/22 0806) Blood pressure 138/67, pulse 84, temperature 98.2 F (36.8 C), temperature source Oral, resp. rate 15, height 5\' 5"  (1.651 m), weight 65.3 kg, SpO2 99%. Body mass index is 23.96 kg/m.  Physical Exam Vitals and nursing note reviewed.  HENT:     Head: Normocephalic.     Nose: Nose normal.  Pulmonary:     Effort: Pulmonary effort is normal.  Musculoskeletal:     Cervical back: Normal range of motion.  Neurological:     Mental Status: She is alert.  Psychiatric:        Mood and Affect: Mood normal.        Behavior: Behavior normal.     Mental Status Exam: General Appearance: Neat and hospital gown  Orientation:  person, place, time and situation  Memory:  Immediate;   Good Recent;   Good Remote;    Good  Concentration:  Concentration: Good and Attention Span: Good  Recall:  Good  Attention  Good  Eye Contact:  Good  Speech:  Normal Rate   Language:  Good  Volume:  Normal  Mood: "very good mood"  Affect:   Bright  Thought Process:  Coherent, Goal Directed, and Linear  Thought Content:  Logical and Rumination  Suicidal Thoughts:  No, denies  Homicidal Thoughts: Denies  Judgement:  Good  Insight:  Fair  Psychomotor Activity:  Normal  Akathisia:  No  Fund of Knowledge:  Fair      Assets:  Housing Leisure Time Resilience Social Support  Cognition:  WNL  ADL's:  Intact with minimal assistance  AIMS (if indicated):        Other History   These have been pulled in through the EMR, reviewed, and updated if appropriate.  Family History:  The patient's family history includes Dementia in her mother; Heart attack in her father; Heart disease in her father; Hyperlipidemia in her mother; Hypertension in her mother.  Medical History: Past Medical History:  Diagnosis Date   ADHD    Anxiety    Bronchitis, chronic (HCC)    Cervical cancer (HCC)    Depression    Depression    Hyperlipidemia    Hypertension    Insomnia disorder    Lyme disease    Migraine    Ovarian cancer (HCC)    Palpitations     Surgical History: Past Surgical History:  Procedure Laterality Date   ABDOMINAL HYSTERECTOMY  1994   ANTERIOR CERVICAL DECOMP/DISCECTOMY FUSION  2000   AUGMENTATION MAMMAPLASTY Bilateral 1998   BREAST ENHANCEMENT SURGERY Bilateral    EYE SURGERY  2016   PVD     Medications:   Current Facility-Administered Medications:    ALPRAZolam (XANAX) tablet 0.5 mg, 0.5 mg, Oral, BID, Lord, Jamison Y, NP, 0.5 mg at 03/07/24 1610   aspirin EC tablet 81 mg, 81 mg, Oral, Daily, Skip Mayer A, MD, 81 mg at 03/07/24 0836   Chlorhexidine Gluconate Cloth 2 % PADS 6 each, 6 each, Topical, Q0600, Albertine Grates, MD, 6 each at 03/07/24 0939   enoxaparin (LOVENOX) injection 40 mg, 40 mg,  Subcutaneous, Q24H, Rodolph Bong, MD, 40 mg at 03/06/24 2015   [START ON 03/08/2024] escitalopram (LEXAPRO) tablet 10 mg, 10 mg, Oral, Daily, Blaze Nylund, NP   famotidine (PEPCID) tablet 20 mg, 20 mg, Oral, QHS, Rodolph Bong, MD, 20 mg at 03/06/24 2013   gabapentin (NEURONTIN) capsule 100 mg, 100 mg, Oral, BID, Charm Rings, NP, 100 mg at 03/07/24 9604   Gerhardt's butt cream, , Topical, PRN, Rodolph Bong, MD, 1 Application at 03/07/24 0630   levothyroxine (SYNTHROID) tablet 50 mcg, 50 mcg, Oral, QAC breakfast, Rodolph Bong, MD, 50 mcg at 03/07/24 5409   loperamide (IMODIUM) capsule 2 mg, 2 mg, Oral, PRN, Anthoney Harada, NP, 2 mg at 03/06/24 2107   midodrine (PROAMATINE) tablet 5 mg, 5 mg, Oral, TID WC, Rolly Salter, MD, 5 mg at 03/07/24 0836   morphine (MS CONTIN) 12 hr tablet 15 mg, 15 mg, Oral, Q12H, Rodolph Bong, MD, 15 mg at 03/07/24 8119   multivitamin with minerals tablet 1 tablet, 1 tablet, Oral, Q24H, Lord, Jamison Y, NP, 1 tablet at 03/06/24 1140   OLANZapine (ZYPREXA) tablet 2.5 mg, 2.5 mg, Oral, QHS, Hebishi, Karim A, MD, 2.5 mg at 03/06/24 2013   oxyCODONE (Oxy IR/ROXICODONE) immediate release tablet 5 mg, 5 mg, Oral, Q8H PRN, Rodolph Bong, MD, 5 mg at 03/07/24 1478   pantoprazole (PROTONIX) EC tablet 40 mg, 40 mg, Oral, Daily, Rodolph Bong, MD, 40 mg at 03/07/24 0834   polyethylene glycol (MIRALAX / GLYCOLAX) packet 17 g, 17 g, Oral, Daily PRN, Rodolph Bong, MD   rosuvastatin (CRESTOR) tablet 10 mg, 10 mg, Oral, Daily, Rodolph Bong, MD, 10 mg at 03/07/24 2956   senna-docusate (Senokot-S) tablet 1 tablet, 1 tablet, Oral, QHS PRN, Rodolph Bong, MD   sodium chloride flush (NS) 0.9 % injection 10-40 mL, 10-40 mL, Intracatheter,  Q12H, Rodolph Bong, MD, 10 mL at 03/07/24 6578   sodium chloride flush (NS) 0.9 % injection 10-40 mL, 10-40 mL, Intracatheter, PRN, Rodolph Bong, MD   tamsulosin [facility]) capsule 0.4  mg, 0.4 mg, Oral, QPC breakfast, Rolly Salter, MD, 0.4 mg at 03/07/24 4696   thiamine (VITAMIN B1) tablet 100 mg, 100 mg, Oral, Daily, Charm Rings, NP, 100 mg at 03/07/24 2952  Allergies: Allergies  Allergen Reactions   Amlodipine Swelling and Other (See Comments)    Leg edema and fluid retention   Atorvastatin Other (See Comments)    Other reaction(s): back ache   Bystolic [Nebivolol Hcl] Other (See Comments)    sweat and get clammy   Carbamazepine Other (See Comments)    rash   Mirtazapine Other (See Comments)    Other reaction(s): edema, vivid nightmares, patient was unsure    Penicillins Nausea And Vomiting   Sulfacetamide Nausea Only    Dafney Farler, NP

## 2024-03-07 NOTE — Progress Notes (Signed)
 [facility] PROGRESS NOTE  Patient: Sharon Huffman ZHY:[id number]   PCP: Merri Brunette, MD DOB: [birth year]   DOA: 02/23/2024   DOS: 03/07/2024    Assessment and plan: Husband came to bedside in the afternoon.  After discussion with the social worker husband states that he wants to take the patient home.  Husband claims that he has made some arrangements for the patient to come home. Husband reports that there has been no communication to him throughout the hospitalization with regards to plan of care. Patient and husband reports that they were told that patient will be "just discharge". Note from 3/19 from psychiatry 'Her husband who was at her bedside also agrees with the plan. We recommend a referrer to psychiatric inpatient hospital once she is medically cleared.' At present based on evaluation by psychiatry on 3/22 patient needs to be admitted to inpatient psychiatric unit once medically stable. If the patient leaves the hospital AMA, she needs to be IVC. Currently I will offer the patient a reevaluation with psychiatry in the morning to see if she is stable enough to be discharged to home although most likely she may still require inpatient psychiatric admission.  After the reevaluation she may still need to go to inpatient psychiatric unit if needed.  At the inpatient psychiatric unit her duration of stay will be depending on her progression. This was clearly communicated with husband and patient. Charge RN was at bedside with me as well. Currently canceling the discharge for psych reeval.  Author: Lynden Oxford, MD [facility] 03/07/2024 4:38 PM   If 7PM-7AM, please contact night-coverage at [email or web address]

## 2024-03-07 NOTE — Progress Notes (Signed)
 Pt complained of gradual headache that has progressed to migraine. Pt has history of migraines. Pt takes Excedrin migraine at home, requested dose. ON call Anthoney Harada, NP contacted. Excedrin ordered and administered.

## 2024-03-07 NOTE — Plan of Care (Signed)
   Problem: Activity: Goal: Risk for activity intolerance will decrease Outcome: Progressing   Problem: Coping: Goal: Level of anxiety will decrease Outcome: Progressing   Problem: Safety: Goal: Ability to remain free from injury will improve Outcome: Progressing

## 2024-03-07 NOTE — Plan of Care (Signed)
  Problem: Education: Goal: Knowledge of General Education information will improve Description: Including pain rating scale, medication(s)/side effects and non-pharmacologic comfort measures Outcome: Progressing   Problem: Clinical Measurements: Goal: Respiratory complications will improve Outcome: Progressing   Problem: Activity: Goal: Risk for activity intolerance will decrease Outcome: Progressing   Problem: Nutrition: Goal: Adequate nutrition will be maintained Outcome: Progressing   Problem: Coping: Goal: Level of anxiety will decrease Outcome: Progressing   Problem: Elimination: Goal: Will not experience complications related to bowel motility Outcome: Progressing Goal: Will not experience complications related to urinary retention Outcome: Progressing   Problem: Pain Managment: Goal: General experience of comfort will improve and/or be controlled Outcome: Progressing   Problem: Safety: Goal: Ability to remain free from injury will improve Outcome: Progressing   Problem: Skin Integrity: Goal: Risk for impaired skin integrity will decrease Outcome: Progressing

## 2024-03-07 NOTE — Discharge Summary (Addendum)
 Physician Discharge Summary   Patient: Sharon Huffman MRN: [id number] DOB: 16-Dec-1961  Admit date:     02/23/2024  Discharge date: 03/07/24  Discharge Physician: Lynden Oxford  PCP: Merri Brunette, MD  Recommendations at discharge: Follow up with PCP in 1 week.  Will need referral to Behavior health once completing inpatient psych stay.  Will need prescription for all her psych medicines once she completes her inpatient psych stay.    Follow-up Information     Merri Brunette, MD. Schedule an appointment as soon as possible for a visit in 2 week(s).   Specialty: Internal Medicine Why: with BMP lab to look at kidney and electrolytes Contact information: [address] [place] [address] [phone]                Discharge Diagnoses: Principal Problem:   Overdose Active Problems:   Depression   Hypophosphatemia   Hyperkalemia   Hypomagnesemia   ADHD   Aspiration pneumonia (HCC)   Nausea and vomiting   Dehydration   AKI (acute kidney injury) (HCC)   Hyperlipidemia   Hypothyroidism   Major depressive disorder, recurrent severe without psychotic features (HCC)   Acute urinary retention   Hypokalemia   Chronic pain    Hospital Course: PMH of HLD, depression, HTN, hypothyroidism, migraine disease, ADHD presented to hospital with complaints of unresponsive event at home.  Found by husband.  Suspecting intentional substance overdose. Psychiatry was consulted. Recommending inpatient psychiatric admission.   Assessment & Plan: Drug overdose Likely intentional overdose. -Patient recently noted to have lost her mother who died in her arms per patient and since then patient has felt depressed and etiology of why she took medications however cannot quite tell me what specific medications and how much she took. Currently denying any suicidal or homicidal ideation. Continue one-to-one sitter. Psychiatry consulted. Patient currently voluntary for inpatient  psychiatric admission. If the patient tries to leave AMA, psychiatry recommending IVC paperwork.   Aspiration pneumonia Treated with antibiotic. Oxygenation normal.   AKI on CKD stage 3a Likely from poor p.o. intake. As well as ongoing use of diuretic prior to admission. Renal function improved with IV fluid  Urinary retention. Likely due to decreased alertness. Required a Foley catheter, Foley catheter removed on 3/19. Able to urinate without the catheter for now. Monitor.  Hyperlipidemia -Statin.   Hypertension Blood pressure soft. Holding medication for now. Outpt follow up with PCP    ADHD/depression/anxiety/agitation/delirium Had hallucination earlier.  Currently resolved Continue delirium precautions. Per psychiatry recommended inpatient psychiatric hospitalization once patient medically stable.   Nausea and vomiting Resolved.   Migraine headaches -Stable.   Hypothyroidism -Synthroid.    Hypokalemia Replaced.   IBS diarrhea Patient with multiple watery loose stools on laxatives. Laxatives discontinued and changed to as needed. Diarrhea improved.   Chronic pain, POA Chronically on MS Contin as well as as needed Norco. Will resume home regimen MS Contin 15 mg twice daily.  Bilateral shoulder pain. X-ray performed. Shows evidence of osteophytes.  Consultants:  Psychiatry  Procedures performed:  none  DISCHARGE MEDICATION: Allergies as of 03/07/2024       Reactions   Amlodipine Swelling, Other (See Comments)   Leg edema and fluid retention   Atorvastatin Other (See Comments)   Other reaction(s): back ache   Bystolic [nebivolol Hcl] Other (See Comments)   sweat and get clammy   Carbamazepine Other (See Comments)   rash   Mirtazapine Other (See Comments)   Other reaction(s): edema, vivid  nightmares, patient was unsure    Penicillins Nausea And Vomiting   Sulfacetamide Nausea Only        Medication List     STOP taking these  medications    acebutolol 200 MG capsule Commonly known as: SECTRAL   FLUoxetine 40 MG capsule Commonly known as: PROZAC   furosemide 20 MG tablet Commonly known as: LASIX   methylPREDNISolone 4 MG tablet Commonly known as: MEDROL   spironolactone 50 MG tablet Commonly known as: ALDACTONE       TAKE these medications    ALPRAZolam 1 MG tablet Commonly known as: XANAX Take 1 mg by mouth 2 (two) times daily as needed for anxiety.   amphetamine-dextroamphetamine 30 MG 24 hr capsule Commonly known as: ADDERALL XR Take 30 mg by mouth at bedtime.   aspirin EC 81 MG tablet Take 1 tablet (81 mg total) by mouth daily. Swallow whole. Start taking on: March 08, 2024   Biotin 10 MG Caps Take by mouth daily.   celecoxib 200 MG capsule Commonly known as: CELEBREX Take 200 mg by mouth 2 (two) times daily as needed for mild pain (pain score 1-3) or moderate pain (pain score 4-6).   escitalopram 10 MG tablet Commonly known as: LEXAPRO Take 1 tablet (10 mg total) by mouth daily. Start taking on: March 08, 2024   famotidine 20 MG tablet Commonly known as: Pepcid One after supper What changed:  how much to take how to take this when to take this additional instructions   folic acid 1 MG tablet Commonly known as: FOLVITE Take 1 mg by mouth daily.   gabapentin 100 MG capsule Commonly known as: NEURONTIN Take 1 capsule (100 mg total) by mouth 2 (two) times daily.   HYDROcodone-acetaminophen 10-325 MG tablet Commonly known as: NORCO Take 0.5 tablets by mouth 2 (two) times daily.   ipratropium 0.06 % nasal spray Commonly known as: ATROVENT Place 2 sprays into both nostrils as needed.   levothyroxine 50 MCG tablet Commonly known as: SYNTHROID Take 50 mcg by mouth daily before breakfast.   midodrine 5 MG tablet Commonly known as: PROAMATINE USE AS DIRECTED What changed:  how much to take how to take this when to take this   morphine 15 MG 12 hr tablet Commonly  known as: MS CONTIN Take 15 mg by mouth 2 (two) times daily.   multivitamin with minerals Tabs tablet Take 1 tablet by mouth daily. Start taking on: March 08, 2024   OLANZapine 2.5 MG tablet Commonly known as: ZYPREXA Take 1 tablet (2.5 mg total) by mouth at bedtime.   pantoprazole 40 MG tablet Commonly known as: PROTONIX TAKE 1 TABLET (40 MG TOTAL) BY MOUTH DAILY. TAKE 30-60 MIN BEFORE FIRST MEAL OF THE DAY   rosuvastatin 10 MG tablet Commonly known as: CRESTOR Take 10 mg by mouth daily.   thiamine 100 MG tablet Commonly known as: Vitamin B-1 Take 1 tablet (100 mg total) by mouth daily. Start taking on: March 08, 2024       Disposition: [facility] Diet recommendation: Cardiac diet  Discharge Exam: Vitals:   03/06/24 1700 03/06/24 1924 03/07/24 0405 03/07/24 0806  BP: 118/64 131/67 132/86 138/67  Pulse: 98 100 99 84  Resp: 16 17 16 15   Temp: 98.6 F (37 C) 98.5 F (36.9 C) 98.5 F (36.9 C) 98.2 F (36.8 C)  TempSrc: Oral Oral Oral Oral  SpO2: 100% 100% 99% 99%  Weight:      Height:  General: Appear in no distress; no visible Abnormal Neck Mass Or lumps, Conjunctiva normal Cardiovascular: S1 and S2 Present, no Murmur, Respiratory: good respiratory effort, Bilateral Air entry present and CTA, no Crackles, no wheezes Abdomen: Bowel Sound present, Non tender  Extremities: no Pedal edema Neurology: alert and oriented to time, place, and person  [facility] Weights   02/23/24 1927  Weight: 65.3 kg   Condition at discharge: stable  The results of significant diagnostics from this hospitalization (including imaging, microbiology, ancillary and laboratory) are listed below for reference.   Imaging Studies: DG Shoulder Left Port Result Date: 03/05/2024 CLINICAL DATA:  Bilateral shoulder pain. EXAM: LEFT SHOULDER COMPARISON:  Chest CT 09/05/2022 FINDINGS: There is no evidence of fracture or dislocation. The alignment and joint spaces are normal appearance. Round hyperdense  structure in the posterior joint space, unchanged from prior exam. No erosive or bony destructive change. IMPRESSION: 1. Round hyperdense structure in the posterior joint space, unchanged from prior exam. This may represent a loose body, but nonspecific by radiograph. 2. Otherwise unremarkable radiographs of the left shoulder. Electronically Signed   By: Narda Rutherford M.D.   On: 03/05/2024 16:27   DG Shoulder Right Port Result Date: 03/05/2024 CLINICAL DATA:  Bilateral shoulder pain, right greater than left EXAM: RIGHT SHOULDER - 1 VIEW COMPARISON:  None Available. FINDINGS: Internal rotation, external rotation, transscapular views of the right shoulder are obtained. No fracture, subluxation, or dislocation. Joint spaces are well preserved. Ossific density inferior to the glenohumeral joint could reflect a bursal loose body. Soft tissues are otherwise unremarkable. Right chest is clear. IMPRESSION: 1. No acute displaced fracture. 2. 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 Electronically Signed   By: Sharlet Salina M.D.   On: 03/05/2024 16:26   DG Abd Portable 1V Result Date: 02/26/2024 CLINICAL DATA:  Nausea and vomiting. EXAM: PORTABLE ABDOMEN - 1 VIEW COMPARISON:  None Available. FINDINGS: Air scattered throughout nondilated small and large bowel. No evidence of obstruction. Minimal formed stool in the colon. No visible radiopaque calculi or abnormal soft tissue calcifications. IMPRESSION: Nonobstructive bowel gas pattern. Electronically Signed   By: Narda Rutherford M.D.   On: 02/26/2024 22:50   MR LUMBAR SPINE WO CONTRAST Result Date: 02/24/2024 CLINICAL DATA:  63 year old female with altered mental status and neurologic deficit. EXAM: MRI LUMBAR SPINE WITHOUT CONTRAST TECHNIQUE: Multiplanar, multisequence MR imaging of the lumbar spine was performed. No intravenous contrast was administered. COMPARISON:  Lumbar MRI 07/07/2015. CT  Abdomen and Pelvis 09/05/2022. FINDINGS: Segmentation: Normal on the prior CT, the same numbering system used in 2016. Alignment: Maintained lumbar lordosis with increased chronic spondylolisthesis at L4-L5. Anterolisthesis there now up to 6 mm versus subtle in 2016, stable to minimally increased since the 2023 CT. Mild chronic retrolisthesis of L5 on S1 is stable. Mild underlying dextroconvex lumbar scoliosis. Vertebrae: Maintained vertebral body height. Background bone marrow signal within normal limits. Superimposed chronic degenerative endplate marrow signal changes at multiple levels. No convincing marrow edema, acute osseous abnormality. Intact visible sacrum and SI joints. Conus medullaris and cauda equina: Conus extends to the L1 level. No lower spinal cord or conus signal abnormality. Capacious spinal canal at most levels, see details below. Generally normal cauda equina nerve roots. Paraspinal and other soft tissues: Negative. Disc levels: Lower thoracic generalized disc space loss, disc bulging and endplate spurring. No lower thoracic spinal stenosis through the T12-L1 level which is at the conus. L1-L2: Circumferential disc bulging and mild to  moderate facet hypertrophy without significant stenosis. Trace degenerative facet joint fluid. L2-L3: Minimal disc bulging. Mild to moderate facet and ligament flavum hypertrophy. No stenosis. L3-L4: Mild leftward disc bulge or broad-based disc protrusion. Mild to moderate facet and ligament flavum hypertrophy. Degenerative facet joint fluid. Degenerative signal changes in the interspinous ligament. No spinal stenosis. Borderline to mild left lateral recess stenosis (left L4 nerve level series 6, image 24). No foraminal stenosis. L4-L5: Anterolisthesis. Circumferential disc/pseudo disc. Severe facet hypertrophy. Moderate ligament flavum hypertrophy. Degenerative signal changes in the interspinous ligament. No significant spinal stenosis but mild to moderate  bilateral lateral recess stenosis (L5 nerve levels). Mild bilateral L4 foraminal stenosis. L5-S1: Chronically advanced disc degeneration, vacuum disc. Circumferential disc osteophyte complex. Mild to moderate facet hypertrophy. No spinal stenosis. Mild lateral recess stenosis at the bilateral S1 nerve levels. Mild left but moderate to severe right L5 neural foraminal stenosis. IMPRESSION: 1. No acute osseous abnormality in the lumbar spine. 2. Chronic L4-L5 spondylolisthesis. Multilevel lumbar disc bulging. Multilevel moderate and occasionally severe facet degeneration. No significant spinal stenosis, but intermittent mild lateral recess stenosis and moderate to severe right L5 nerve level foraminal stenosis. Electronically Signed   By: Odessa Fleming M.D.   On: 02/24/2024 10:50   DG CHEST PORT 1 VIEW Result Date: 02/24/2024 CLINICAL DATA:  [id number] Aspiration pneumonia (HCC) [id number]. EXAM: PORTABLE CHEST 1 VIEW COMPARISON:  02/23/2024. FINDINGS: There are heterogeneous alveolar and interstitial opacities throughout bilateral lung bases, left greater than right, without significant volume loss, concerning for multilobar pneumonia. Follow-up to clearing is recommended. No dense consolidation or lung collapse. Bilateral lateral costophrenic angles are clear. No pneumothorax. Normal cardio-mediastinal silhouette. No acute osseous abnormalities. Lower cervical spinal fixation hardware noted. The soft tissues are within normal limits. IMPRESSION: Findings favor multilobar pneumonia. Follow-up to clearing is recommended. Electronically Signed   By: Jules Schick M.D.   On: 02/24/2024 10:28   MR BRAIN WO CONTRAST Result Date: 02/24/2024 CLINICAL DATA:  63 year old female with altered mental status, neurologic deficit. EXAM: MRI HEAD WITHOUT CONTRAST TECHNIQUE: Multiplanar, multiecho pulse sequences of the brain and surrounding structures were obtained without intravenous contrast. COMPARISON:  Head CT yesterday.  Brain MRI  10/17/2016. FINDINGS: Brain: Cerebral volume is within normal limits for age. No restricted diffusion to suggest acute infarction. No midline shift, mass effect, evidence of mass lesion, ventriculomegaly, extra-axial collection or acute intracranial hemorrhage. Cervicomedullary junction and pituitary are within normal limits. Wallace Cullens and white matter signal is within normal limits for age throughout the brain. No cortical encephalomalacia. No chronic cerebral blood products. Vascular: Major intracranial vascular flow voids are preserved, stable. Skull and upper cervical spine: Negative. Visualized bone marrow signal is within normal limits. Sinuses/Orbits: Postoperative changes to both globes since 2017. Minimal paranasal sinus disease. Other: Mastoids remain well aerated. Grossly normal visible internal auditory structures. Negative visible scalp and face. IMPRESSION: Normal for age noncontrast MRI appearance of the Brain. Electronically Signed   By: Odessa Fleming M.D.   On: 02/24/2024 05:23   CT Head Wo Contrast Result Date: 02/23/2024 CLINICAL DATA:  Mental status change, unknown cause EXAM: CT HEAD WITHOUT CONTRAST TECHNIQUE: Contiguous axial images were obtained from the base of the skull through the vertex without intravenous contrast. RADIATION DOSE REDUCTION: This exam was performed according to the departmental dose-optimization program which includes automated exposure control, adjustment of the mA and/or kV according to patient size and/or use of iterative reconstruction technique. COMPARISON:  None Available. FINDINGS: Brain: No intracranial hemorrhage, mass effect,  or midline shift. No hydrocephalus. The basilar cisterns are patent. No evidence of territorial infarct or acute ischemia. No extra-axial or intracranial fluid collection. Vascular: Atherosclerosis of skullbase vasculature without hyperdense vessel or abnormal calcification. Skull: No fracture or focal lesion. Sinuses/Orbits: No acute finding.   Bilateral cataract resection. Other: None. IMPRESSION: No acute intracranial abnormality. Electronically Signed   By: Narda Rutherford M.D.   On: 02/23/2024 22:25   DG Chest Portable 1 View Result Date: 02/23/2024 CLINICAL DATA:  SOB, hypoxia, rhonchi, OD EXAM: PORTABLE CHEST 1 VIEW COMPARISON:  Radiograph 03/20/2019, CT 09/05/2022 FINDINGS: Mild ill-defined patchy opacity at the left lung base. The heart is normal in size. Mediastinal contours are normal. No pulmonary edema, pleural effusion, or pneumothorax. On limited assessment, no acute osseous findings. IMPRESSION: Mild ill-defined patchy opacity at the left lung base, suspicious for pneumonia. Electronically Signed   By: Narda Rutherford M.D.   On: 02/23/2024 20:19    Microbiology: Results for orders placed or performed during the hospital encounter of 02/23/24  Resp panel by RT-PCR (RSV, Flu A&B, Covid) Anterior Nasal Swab     Status: None   Collection Time: 02/23/24  8:10 PM   Specimen: Anterior Nasal Swab  Result Value Ref Range Status   SARS Coronavirus 2 by RT PCR NEGATIVE NEGATIVE Final   Influenza A by PCR NEGATIVE NEGATIVE Final   Influenza B by PCR NEGATIVE NEGATIVE Final    Comment: (NOTE) The Xpert Xpress SARS-CoV-2/FLU/RSV plus assay is intended as an aid in the diagnosis of influenza from Nasopharyngeal swab specimens and should not be used as a sole basis for treatment. Nasal washings and aspirates are unacceptable for Xpert Xpress SARS-CoV-2/FLU/RSV testing.  Fact Sheet for Patients: BloggerCourse.com  Fact Sheet for Healthcare Providers: SeriousBroker.it  This test is not yet approved or cleared by the [place] FDA and has been authorized for detection and/or diagnosis of SARS-CoV-2 by FDA under an Emergency Use Authorization (EUA). This EUA will remain in effect (meaning this test can be used) for the duration of the COVID-19 declaration under Section  564(b)(1) of the Act, 21 U.S.C. section 360bbb-3(b)(1), unless the authorization is terminated or revoked.     Resp Syncytial Virus by PCR NEGATIVE NEGATIVE Final    Comment: (NOTE) Fact Sheet for Patients: BloggerCourse.com  Fact Sheet for Healthcare Providers: SeriousBroker.it  This test is not yet approved or cleared by the [place] FDA and has been authorized for detection and/or diagnosis of SARS-CoV-2 by FDA under an Emergency Use Authorization (EUA). This EUA will remain in effect (meaning this test can be used) for the duration of the COVID-19 declaration under Section 564(b)(1) of the Act, 21 U.S.C. section 360bbb-3(b)(1), unless the authorization is terminated or revoked.  Performed at [facility], [address] [place], [place] [address]   Blood Culture (routine x 2)     Status: None   Collection Time: 02/23/24  8:10 PM   Specimen: BLOOD  Result Value Ref Range Status   Specimen Description BLOOD RIGHT ANTECUBITAL  Final   Special Requests   Final    BOTTLES DRAWN AEROBIC AND ANAEROBIC Blood Culture results may not be optimal due to an inadequate volume of blood received in culture bottles   Culture   Final    NO GROWTH 5 DAYS Performed at [facility], [address], [place], [place] [address]    Report Status 02/28/2024 FINAL  Final   Labs: CBC: Recent Labs  Lab 03/01/24  0134 03/03/24 0834  WBC 7.6 7.6  HGB 10.9* 10.9*  HCT 32.5* 33.7*  MCV 96.2 97.4  PLT 279 321   Basic Metabolic Panel: Recent Labs  Lab 03/01/24 0134 03/03/24 0834 03/04/24 1147 03/05/24 0943 03/06/24 0325 03/07/24 0351  NA 145 145 144 138 139 136  K 3.8 3.2* 4.2 3.5 4.1 3.6  CL 111 110 114* 106 105 107  CO2 22 22 22 23 23 25   GLUCOSE 127* 83 99 151* 109* 108*  BUN 10 10 9 17 20 17   CREATININE 1.15* 1.17* 1.19* 1.56* 1.28* 1.08*  CALCIUM 9.0 9.0 9.2 8.8* 8.9 8.2*  MG 2.2 1.8 1.8  --  1.8 1.9  PHOS 4.0  3.2  --  4.2  --   --    Liver Function Tests: Recent Labs  Lab 03/01/24 0134 03/03/24 0834 03/05/24 0943  ALBUMIN 2.8* 2.9* 2.6*   CBG: No results for input(s): "GLUCAP" in the last 168 hours.  Discharge time spent: greater than 30 minutes.  Author: Lynden Oxford, MD  [facility]

## 2024-03-07 NOTE — TOC Progression Note (Addendum)
 Transition of Care [facility]) - Progression Note    Patient Details  Name: Sharon Huffman MRN: [id number] Date of Birth: 1961-07-20  [facility] CM/SW Contact  Sharon Huffman, [place] Phone Number: 03/07/2024, 2:33 PM  Clinical Narrative:     Per MD, pt is ready for inpatient psych. Pateint was faxed out in the hub.   CSW received call from  Sharon Huffman at [facility], they are able to accept pt today if she is medically ready. LCM sent secure chat to MD.   3:35pm- Pt has been accepted at [facility] by Sharon Huffman. Member will go to the transitions unit. And the report number will be [phone]. [facility] is located at [address] [place]. Pt will be transported by [employer] by [place], [phone]. CSW instructed Sharon Huffman to call the nurses station when he arrives for pickup.  4pm- CSW spoke to pts husband Sharon Huffman outside of room. [name] stated that he is not please with communication at the hospital and no one has spoken to him about patients DC plan. Sharon Huffman wants to take the pt home and states he has people at home that assist with taking care of her. CSW relayed concerns to team and awaiting a response.   4:36pm- Pt will not DC today, Psych will see patient tomorrow morning to determine next steps.   Expected Discharge Plan: Home/Self Care Barriers to Discharge: Continued Medical Work up  Expected Discharge Plan and Services   Discharge Planning Services: CM Consult   Living arrangements for the past 2 months: Single Family Home                                       Social Determinants of Health (SDOH) Interventions SDOH Screenings   Food Insecurity: No Food Insecurity (02/24/2024)  Housing: Low Risk  (02/24/2024)  Transportation Needs: No Transportation Needs (02/24/2024)  Utilities: Not At Risk (02/24/2024)  Social Connections: Moderately Isolated (02/24/2024)  Tobacco Use: Medium Risk (02/23/2024)     Readmission Risk Interventions     No data to display

## 2024-03-07 NOTE — Progress Notes (Signed)
 Mobility Specialist Progress Note:   03/07/24 0950  Mobility  Activity Ambulated with assistance in hallway  Level of Assistance Contact guard assist, steadying assist  Assistive Device Front wheel walker  Distance Ambulated (ft) 550 ft  Activity Response Tolerated well  Mobility Referral Yes  Mobility visit 1 Mobility  Mobility Specialist Start Time (ACUTE ONLY) 0950  Mobility Specialist Stop Time (ACUTE ONLY) 1002  Mobility Specialist Time Calculation (min) (ACUTE ONLY) 12 min   Pt agreeable to mobility session. Required only minG assist during ambulation with RW. Displayed steady gait throughout. Pt back in bed with all needs met.   Addison Lank Mobility Specialist Please contact via SecureChat or  Rehab office at [phone]

## 2024-03-08 MED ORDER — LOPERAMIDE HCL 2 MG PO CAPS
2.0000 mg | ORAL_CAPSULE | ORAL | Status: DC | PRN
  Administered 2024-03-08 – 2024-03-10 (×5): 2 mg via ORAL
  Filled 2024-03-08 (×4): qty 1

## 2024-03-08 NOTE — Plan of Care (Signed)
   Problem: Activity: Goal: Risk for activity intolerance will decrease Outcome: Progressing   Problem: Coping: Goal: Level of anxiety will decrease Outcome: Progressing

## 2024-03-08 NOTE — Consult Note (Signed)
 [facility] Psychiatric Consult Follow up  Patient Name: .Sharon Huffman  MRN: [id number]  DOB: Aug 14, 1961  Consult Order details:  Orders (From admission, onward)     Start     Ordered   02/24/24 1553  IP CONSULT TO PSYCHIATRY       Ordering Provider: Barnetta Chapel, MD  Provider:  (Not yet assigned)  Question Answer Comment  Location [facility]   Reason for Consult? intentional drug overdose      02/24/24 1552             Mode of Visit: In person    Psychiatry Consult Evaluation  Service Date: March 08, 2024 LOS:  LOS: 13 days  Chief Complaint "I just need to get over saying that.  I feel pretty good.  I'm assuming I tried to kill myself because I ended up in the hospital for five months," and then confusion.  Primary Psychiatric Diagnoses  Major depressive disorder, recurrent, severe  2.  Delirium 3.  General anxiety d/o  Assessment  Sharon Huffman is a 63 y.o. female admitted: Medicallyfor 02/23/2024  7:22 PM for intentional overdose. She carries the psychiatric diagnoses of MDD and GAD and has a past medical history of asthma, hypothyroidism, AKI, chronic fatigue., and hyperlipidemia   Her current presentation of intermittent confusion with agitation is most consistent with delirium. She meets criteria for MDD based on grief issues, difficulty coping, and suicide attempt.  Current outpatient psychotropic medications include Xanax, Prozac, and Adderall and historically she has had a low response to these medications. She was compliant with medications prior to admission as evidenced by husband's report except she was overtaking them. On initial examination, patient had a brief moment of clarity but mostly confused. Please see plan below for detailed recommendations.   02/29/2024: Her cognition remains the same with pleasant confusion and restlessness, constantly trying to get out of the bed, restraints in place.  Rambles with irrelevant sentences, consistent  with delirium.  Her sitter reported that she did not sleep well last night.  She did eat her breakfast and had a bath without issues, no aggression.  The client continues to talk to people who are not there.  Consulted with the psychiatrist and medications changed to assist, hopefully.  03/02/24: On reassessment today, patient found, resting calmly bed with her eyes closed but responsive to conversation. Had breakfast this morning and on behavioral issues reported. She was able to report and describe her headache but the conversation thereafter remained disorganized and largely tangential. She believed that she was at a hotel and was unable to answer orientation questions. Patient continues to exhibit what appeared to be a waxing and waning presentation which is consistent with delirium. Patient was gently reoriented to reality. Tylenol was ordered for headache.   03/04/24: Patient presents calm and pleasant with a brighter affect this morning. Cognition seems to have greatly improved since our last encounter on Monday. Today's conversation is linear, logical and goal directed. She is alert and oriented to person, place, time and situation. Patient has been able to reflect on what appears to have triggered the suicide attempts ( death of her mother) and she is now processing the situation logically. While she is improving, she also recognizes that she requires further psychiatric stabilization in a psychiatric hospital. Patient denied SI/HI, plan or intent to harm and also denied hallucinations in all modalities. Her husband who was at her bedside also agrees with the plan. We recommend  a referrer to psychiatric inpatient hospital once she is medically cleared.   03/06/24: met with patient for reassessment this morning. Found her in bed speaking on the telephone. Conversation was linear, logical and goal directed. She denies SI/HI, but still expressing difficulty dealing with her mother's death and continues to  show interest in pursuing inpatient psychiatry. Patient is currently being monitored and treated fro high creatinine. Current creatinine level today is 1.28 from 1.56 yesterday. She is ambulating with minimal assistance. Patient is not medically cleared at this time, but will be referred to inpatient psych once medically cleared.   03/07/24: On reassessment today, patient found ambulating from the bathroom with a walker. She presents with a big and welcoming smile. Patient reports a restful night. Although she denies having suicidal ideation, plan or intent at this time, however she is still struggling with the death of her mother. She ruminates about their times together and about her death. Patient agrees to that fact that she needs further mental health stabilization and particularly grief support, and she believes that her neighbors and nurses she met here at the hospital will provide her with this.    While she appears to have been demonstrating an improved insight about her presenting problem, she also seems to have an unrealistic expectations of others that may not be available to her when she discharges. Patient was educated on the need for inpatient psychiatric admission to maintain safety and stabilization and for proper discharge planning and outpatient follow up. She reluctantly agrees with the plan at this time. We will increase Lexapro to 10 mg daily as she appears to be stable on it and without side effect. Creatinine level is trending down today 3/22 at 1.08. We recommend inpatient hospitalization when medically stable.   03/08/24: Conversation was brief this afternoon, as patient was taking a nap. She denied SI/HI/AVH, and does not appear to be in an acte distress. We discussed discharge plan once again and patient was told that the plan still remains for her to go to inpatient from here. Explained the benefit on going inpatient such as for continuity of care and to help with proper  transitioning to outpatient treatment. Patient husband Viviann Spare was also notified at [phone]. Husband had no objection and was "grateful for the update."  Psychiatry will follow up with her tomorrow to further discuss about discharge.      Diagnoses:  Active Hospital problems: Principal Problem:   Overdose Active Problems:   Depression   Hypophosphatemia   Hyperkalemia   Hypomagnesemia   ADHD   Aspiration pneumonia (HCC)   Nausea and vomiting   Dehydration   AKI (acute kidney injury) (HCC)   Hyperlipidemia   Hypothyroidism   Major depressive disorder, recurrent severe without psychotic features (HCC)   Acute urinary retention   Hypokalemia   Chronic pain    Plan   ## Psychiatric Medication Recommendations:  Continue Xanax 0.5 mg BID  Continue gabapentin 100 mg BID with potential to increase this Continue Zyprexa 2.5 mg BID Increase to Lexapro 10 mg daily   ## Medical Decision Making Capacity: Not specifically addressed in this encounter  ## Further Work-up:  -- most recent EKG on 03/04/2024 had QtC of 457 -- Pertinent labwork reviewed earlier this admission includes: CMP, CBC with diff, UDS, and U/A -- Elevated creatinine of 1.56 but repeated level on 3/21- 1.28  -- Creatinine level is trending down today 3/22 at 1.08.    ## Disposition:-- We recommend inpatient psychiatric hospitalization  when medically cleared. Patient is under voluntary admission status at this time; please IVC if attempts to leave hospital.  ## Behavioral / Environmental: -Delirium Precautions: Delirium Interventions for Nursing and Staff: - RN to open blinds every AM. - To Bedside: Glasses, hearing aide, and pt's own shoes. Make available to patients. when possible and encourage use. - Encourage po fluids when appropriate, keep fluids within reach. - OOB to chair with meals. - Passive ROM exercises to all extremities with AM & PM care. - RN to assess orientation to person, time and place QAM and  PRN. - Recommend extended visitation hours with familiar family/friends as feasible. - Staff to minimize disturbances at night. Turn off television when pt asleep or when not in use.    ## Safety and Observation Level:  - Based on my clinical evaluation, I estimate the patient to be at moderate risk of self harm in the current setting. - At this time, we recommend  routine. This decision is based on my review of the chart including patient's history and current presentation, interview of the patient, mental status examination, and consideration of suicide risk including evaluating suicidal ideation, plan, intent, suicidal or self-harm behaviors, risk factors, and protective factors. This judgment is based on our ability to directly address suicide risk, implement suicide prevention strategies, and develop a safety plan while the patient is in the clinical setting. Please contact our team if there is a concern that risk level has changed.  CSSR Risk Category:C-SSRS RISK CATEGORY: No Risk  Suicide Risk Assessment: Patient has following modifiable risk factors for suicide: under treated depression , which we are addressing by adjusting medications. Patient has following non-modifiable or demographic risk factors for suicide: history of suicide attempt Patient has the following protective factors against suicide: Access to outpatient mental health care and Supportive family  Thank you for this consult request. Recommendations have been communicated to the primary team.  We will continue to follow at this time.   Marcell Anger, NP       History of Present Illness  Relevant Aspects of [facility] Course:  Admitted on 02/23/2024 for intentional overdose. They are delirious with some improvement today.   Patient Report:  Patient seen this morning in her room, she was observed ambulating to the bathroom with walker. Patient stated that she realized that she has not being "the nicest person and I  feel embarrassed for my behavior last week." Noted that she was in a dark place when her mother died but she is gradually coming to terms with it. Patient mentioned that she single handedly cared for her ailing mother and she "tried my best." Patient acknowledged the presence of her husband who was at her bedside, stating that she "felt grateful and blessed" to have his support.   Concerning continuity of care, she acknowledged the need for further psychiatric stabilization in an inpatient hospital. Patient denied SI/HI, plan or intent to harm and also denied hallucinations in all modalities.   Husband was present at bedside and he noted that patient has being improving for the past few days.   03/06/24: Patient reports a restful night but still experiencing generalized pain. States that her mood has greatly improved but still having some difficulty dealing with her mother's death. She denies SI/HI, plan and intent to harm and reports that she regrets the suicide attempt and she feels bad for "doing that to my husband." he acknowledged the need for further psychiatric stabilization in an inpatient hospital.  03/07/24: Patient reports feeling "wonderful" today. She had just returned from walking with the physical therapist. Notes that she is not experiencing any pain today and has been in a "very good mood" which she attributes to a restful night and the dream she had about her late mother. She describes the dream as "comforting" , stating that her mother told her not to grief about her anymore and to take care of herself. Patient again continues to reminisce about the good times she had with her mother and that she was her best friend. On moving forward with treatment plan, patient is now having a second thought about inpatient psychiatry because she misses her home, husband and her pets. She describes what appears to be an elaborate plan where her neighbours would check up on her and that a few nurses here  had promised to come by her home to check up on her.    03/08/24: Patient found resting in bed and in no acute distress. She denied SI/HI/AVH, and did nit mention her mother on this brief encounter.    Psych ROS:  Depression:  some sadness, ruminating on mother's death Anxiety:  Occasionally  Mania (lifetime and current): Denies Psychosis: (lifetime and current): Not present  Collateral information:  Her husband at her bedside reported she was "more subdued" and less agitated. "She kinda has some data at times, facts are buried with the confusion". He does feel she is doing better physically without so many medications , especially with opiates. Contributes the client to downward spiraling the death of her mother 5 weeks ago that she was caring for. This is when she started over taking her medications.   Review of Systems  Neurological:  Positive for sensory change.  Psychiatric/Behavioral:  Positive for depression. Negative for hallucinations, substance abuse and suicidal ideas. The patient is not nervous/anxious and does not have insomnia.      Psychiatric and Social History  Psychiatric History:  Information collected from patient, chart, sitter, and husband (3/12)  Prev Dx/Sx: MDD, grief, GAD, ADHD Current Psych Provider: [facility] Meds (current): Xanax, Prozac, Adderall Previous Med Trials: unknown Therapy: UTA  Prior Psych Hospitalization: none  Prior Self Harm: UTA Prior Violence: UTA  Family Psych History: UTA Family Hx suicide: UTA  Social History:  Living Situation: lives with her husband  Access to weapons/lethal means: none   Substance History UTA Her husband reported she was overtaking her medication at home   Exam Findings  Physical Exam:  Vital Signs:  Temp:  [98.3 F (36.8 C)-99.1 F (37.3 C)] 99.1 F (37.3 C) (03/23 0800) Pulse Rate:  [101-107] 106 (03/23 0800) Resp:  [16-17] 17 (03/23 0800) BP: (111-123)/(54-59) 111/59 (03/23 0800) SpO2:   [93 %-98 %] 93 % (03/23 0800) Blood pressure (!) 111/59, pulse (!) 106, temperature 99.1 F (37.3 C), temperature source Oral, resp. rate 17, height 5\' 5"  (1.651 m), weight 65.3 kg, SpO2 93%. Body mass index is 23.96 kg/m.  Physical Exam Vitals and nursing note reviewed.  HENT:     Head: Normocephalic.     Nose: Nose normal.  Pulmonary:     Effort: Pulmonary effort is normal.  Musculoskeletal:     Cervical back: Normal range of motion.  Neurological:     Mental Status: She is alert.  Psychiatric:        Mood and Affect: Mood normal.        Behavior: Behavior normal.     Mental Status Exam: General Appearance: Neat  and hospital gown,neat,  laying quietly in bed  Orientation:  person, place, time and situation  Memory:  Immediate;   Good Recent;   Good Remote;   Good  Concentration:  Concentration: Good and Attention Span: Good  Recall:  Good  Attention  Good  Eye Contact:  Good  Speech:  Normal Rate   Language:  Good  Volume:  Normal  Mood: "good"  Affect:   Bright  Thought Process:  Coherent, Goal Directed, and Linear  Thought Content:  WDL and Logical  Suicidal Thoughts:  No, denies  Homicidal Thoughts: Denies  Judgement:  Good  Insight:  Fair  Psychomotor Activity:  Normal  Akathisia:  No  Fund of Knowledge:  Fair      Assets:  Housing Leisure Time Resilience Social Support  Cognition:  WNL  ADL's:  Intact with minimal assistance  AIMS (if indicated):        Other History   These have been pulled in through the EMR, reviewed, and updated if appropriate.  Family History:  The patient's family history includes Dementia in her mother; Heart attack in her father; Heart disease in her father; Hyperlipidemia in her mother; Hypertension in her mother.  Medical History: Past Medical History:  Diagnosis Date   ADHD    Anxiety    Bronchitis, chronic (HCC)    Cervical cancer (HCC)    Depression    Depression    Hyperlipidemia    Hypertension     Insomnia disorder    Lyme disease    Migraine    Ovarian cancer (HCC)    Palpitations     Surgical History: Past Surgical History:  Procedure Laterality Date   ABDOMINAL HYSTERECTOMY  1994   ANTERIOR CERVICAL DECOMP/DISCECTOMY FUSION  2000   AUGMENTATION MAMMAPLASTY Bilateral 1998   BREAST ENHANCEMENT SURGERY Bilateral    EYE SURGERY  2016   PVD     Medications:   Current Facility-Administered Medications:    ALPRAZolam (XANAX) tablet 0.5 mg, 0.5 mg, Oral, BID, Lord, Jamison Y, NP, 0.5 mg at 03/08/24 9147   aspirin EC tablet 81 mg, 81 mg, Oral, Daily, Skip Mayer A, MD, 81 mg at 03/08/24 8295   Chlorhexidine Gluconate Cloth 2 % PADS 6 each, 6 each, Topical, Q0600, Albertine Grates, MD, 6 each at 03/08/24 1125   enoxaparin (LOVENOX) injection 40 mg, 40 mg, Subcutaneous, Q24H, Rodolph Bong, MD, 40 mg at 03/07/24 2208   escitalopram (LEXAPRO) tablet 10 mg, 10 mg, Oral, Daily, Kaidyn Javid, NP, 10 mg at 03/08/24 6213   famotidine (PEPCID) tablet 20 mg, 20 mg, Oral, QHS, Rodolph Bong, MD, 20 mg at 03/07/24 2210   gabapentin (NEURONTIN) capsule 100 mg, 100 mg, Oral, BID, Charm Rings, NP, 100 mg at 03/08/24 0865   Gerhardt's butt cream, , Topical, PRN, Rodolph Bong, MD, 1 Application at 03/07/24 0630   levothyroxine (SYNTHROID) tablet 50 mcg, 50 mcg, Oral, QAC breakfast, Rodolph Bong, MD, 50 mcg at 03/08/24 0630   loperamide (IMODIUM) capsule 2 mg, 2 mg, Oral, PRN, Rolly Salter, MD, 2 mg at 03/08/24 7846   midodrine (PROAMATINE) tablet 5 mg, 5 mg, Oral, TID WC, Rolly Salter, MD, 5 mg at 03/08/24 1334   morphine (MS CONTIN) 12 hr tablet 15 mg, 15 mg, Oral, Q12H, Rodolph Bong, MD, 15 mg at 03/08/24 9629   multivitamin with minerals tablet 1 tablet, 1 tablet, Oral, Q24H, Charm Rings, NP, 1 tablet at 03/08/24 1334  OLANZapine (ZYPREXA) tablet 2.5 mg, 2.5 mg, Oral, QHS, Hebishi, Karim A, MD, 2.5 mg at 03/07/24 2210   oxyCODONE (Oxy IR/ROXICODONE)  immediate release tablet 5 mg, 5 mg, Oral, Q8H PRN, Rodolph Bong, MD, 5 mg at 03/08/24 1610   pantoprazole (PROTONIX) EC tablet 40 mg, 40 mg, Oral, Daily, Rodolph Bong, MD, 40 mg at 03/08/24 9604   polyethylene glycol (MIRALAX / GLYCOLAX) packet 17 g, 17 g, Oral, Daily PRN, Rodolph Bong, MD   rosuvastatin (CRESTOR) tablet 10 mg, 10 mg, Oral, Daily, Rodolph Bong, MD, 10 mg at 03/08/24 5409   senna-docusate (Senokot-S) tablet 1 tablet, 1 tablet, Oral, QHS PRN, Rodolph Bong, MD   sodium chloride flush (NS) 0.9 % injection 10-40 mL, 10-40 mL, Intracatheter, Q12H, Rodolph Bong, MD, 10 mL at 03/08/24 1125   sodium chloride flush (NS) 0.9 % injection 10-40 mL, 10-40 mL, Intracatheter, PRN, Rodolph Bong, MD   tamsulosin [facility]) capsule 0.4 mg, 0.4 mg, Oral, QPC breakfast, Rolly Salter, MD, 0.4 mg at 03/08/24 8119   thiamine (VITAMIN B1) tablet 100 mg, 100 mg, Oral, Daily, Charm Rings, NP, 100 mg at 03/08/24 1478  Allergies: Allergies  Allergen Reactions   Amlodipine Swelling and Other (See Comments)    Leg edema and fluid retention   Atorvastatin Other (See Comments)    Other reaction(s): back ache   Bystolic [Nebivolol Hcl] Other (See Comments)    sweat and get clammy   Carbamazepine Other (See Comments)    rash   Mirtazapine Other (See Comments)    Other reaction(s): edema, vivid nightmares, patient was unsure    Penicillins Nausea And Vomiting   Sulfacetamide Nausea Only    Ilias Stcharles, NP

## 2024-03-08 NOTE — Progress Notes (Signed)
 [facility] PROGRESS NOTE  Patient: Sharon Huffman UEA:[id number]   PCP: Merri Brunette, MD DOB: 25-Nov-1961   DOA: 02/23/2024   DOS: 03/08/2024    Subjective: No nausea no vomiting no fever no chills.  Diarrhea improving.  Objective:  Vitals:   03/07/24 1843 03/07/24 2219 03/08/24 0800 03/08/24 1618  BP: (!) 112/58 (!) 123/54 (!) 111/59 128/70  Pulse: (!) 101 (!) 107 (!) 106 100  Resp: 16 16 17 18   Temp: 98.3 F (36.8 C) 98.7 F (37.1 C) 99.1 F (37.3 C) 98.4 F (36.9 C)  TempSrc: Oral Oral Oral Oral  SpO2: 97% 98% 93% 100%  Weight:      Height:       Clear to auscultation. S1-S2 present. Improving edema in the lower extremity.  Assessment and plan: Concern for intentional drug overdose. Psychiatry consulted. Recommend inpatient psych admission. Currently the patient is denying any suicidal ideation. Psychiatry recommending observation overnight with plan for social worker consult tomorrow and work on possible discharge home versus still need for IVC pending further evaluation tomorrow. Monitor for now.  AKI. Resolved.  Lower extremity edema. Continue TED stockings.  Fatigue. Able to ambulate well.  Urinary retention. Able to urinate without any issue.  IBS-D. Continue Imodium.  Author: Lynden Oxford, MD [facility] 03/08/2024 8:05 PM   If 7PM-7AM, please contact night-coverage at [email or web address]

## 2024-03-08 NOTE — Progress Notes (Signed)
 Mobility Specialist Progress Note:   03/08/24 0945  Mobility  Activity Ambulated with assistance in hallway  Level of Assistance Contact guard assist, steadying assist  Assistive Device Front wheel walker  Distance Ambulated (ft) 550 ft  Activity Response Tolerated well  Mobility Referral Yes  Mobility visit 1 Mobility  Mobility Specialist Start Time (ACUTE ONLY) 0945  Mobility Specialist Stop Time (ACUTE ONLY) 1000  Mobility Specialist Time Calculation (min) (ACUTE ONLY) 15 min   Pt agreeable to mobility session. Required only minG assist throughout ambulation in hallway with RW. Pt c/o feeling weak, no unsteadiness noted. Back in bed with all needs met, sitter in room.   Addison Lank Mobility Specialist Please contact via SecureChat or  Rehab office at [phone]

## 2024-03-09 DIAGNOSIS — T50901D Poisoning by unspecified drugs, medicaments and biological substances, accidental (unintentional), subsequent encounter: Secondary | ICD-10-CM | POA: Diagnosis not present

## 2024-03-09 NOTE — Progress Notes (Signed)
 [facility] Progress Note Patient: Sharon Huffman ZOX:[id number] DOB: [birth year] DOA: 02/23/2024  DOS: the patient was seen and examined on 03/09/2024  Brief Hospital Course: PMH of HLD, depression, HTN, hypothyroidism, migraine disease, ADHD presented to hospital with complaints of unresponsive event at home.  Found by husband.  Suspecting intentional substance overdose. Psychiatry was consulted. Recommending inpatient psychiatric admission.   Assessment & Plan: Intentional drug overdose -Likely intentional overdose. -Patient recently noted to have lost her mother who died in her arms per patient and since then patient has felt depressed and etiology of why she took medications however cannot quite tell me what specific medications and how much she took. Currently denying any suicidal or homicidal ideation. Continue one-to-one sitter. Psychiatry consulted. Patient currently voluntary for inpatient psychiatric admission. If the patient tries to leave AMA, psychiatry recommending IVC paperwork. As of discussion on 3/24, psychiatry currently feels that the patient can be discharged home.  They informed me that bedside sitter can be discontinued.  Will follow once the note is available.   Aspiration pneumonia Treated with antibiotic. Oxygenation normal.   AKI Likely from poor p.o. intake. As well as ongoing use of diuretic prior to admission. Renal function improved with IV fluid but worsened again on 3/20. Receiving IV fluid again.  Recommend oral hydration.  Will reduce the rate. Monitor.  Urinary retention. Likely due to decreased alertness. Required a Foley catheter, Foley catheter removed on 3/19. Able to urinate while for now. Monitor.  Hyperlipidemia -Statin.   Hypertension Blood pressure soft. Holding medication for now.   ADHD/depression/anxiety/agitation/delirium Had hallucination earlier.  Currently resolved Continue delirium precautions. Per psychiatry  recommended inpatient psychiatric hospitalization once patient medically stable.   Nausea and vomiting Resolved.   Migraine headaches -Stable.   Hypothyroidism -Synthroid.    Hypokalemia Replaced.   IBS diarrhea Patient with multiple watery loose stools on laxatives. Laxatives discontinued and changed to as needed. Diarrhea improved.   Chronic pain, POA Chronically on MS Contin as well as as needed Norco. Will resume home regimen MS Contin 15 mg twice daily. Oxycodone 5 mg every 6 hours as needed breakthrough pain.  Bilateral shoulder pain. X-ray performed. Shows evidence of osteophytes. Will monitor for  Subjective: No acute complaint.  No nausea no vomiting fever no chills.  Physical Exam: Edema improving. Clear to auscultation.  Data Reviewed: I have Reviewed nursing notes, Vitals, and Lab results. Disposition: Status is: Inpatient Remains inpatient appropriate because: Awaiting dispo per psych.  Place TED hose Start: 03/07/24 1638 enoxaparin (LOVENOX) injection 40 mg Start: 02/28/24 2000 SCDs Start: 02/24/24 0113   Family Communication: Discussed with husband on the phone. Level of care: Med-Surg   Vitals:   03/08/24 2038 03/09/24 0014 03/09/24 0558 03/09/24 1215  BP: (!) 119/59 130/68 113/73 (!) 112/54  Pulse: (!) 108 99 (!) 105 93  Resp: 20 20 18 18   Temp: 98.6 F (37 C) 98.6 F (37 C) 98.9 F (37.2 C) 98.3 F (36.8 C)  TempSrc: Oral Oral Oral Oral  SpO2: 96% 99% 94% 96%  Weight:      Height:         Author: Lynden Oxford, MD 03/09/2024 7:06 PM  Please look on [email or web address] to find out who is on call.

## 2024-03-09 NOTE — Progress Notes (Signed)
 Speech Language Pathology Treatment: Dysphagia  Patient Details Name: Sharon Huffman MRN: [id number] DOB: 10/20/1961 Today's Date: 03/09/2024 Time: 4098-1191 SLP Time Calculation (min) (ACUTE ONLY): 16 min  Assessment / Plan / Recommendation Clinical Impression  Pt was seen outside of meal time but did consume more POs than she has in the past during SLP visits. She continues to have no overt signs of dysphagia or aspiration. She subjectively has c/o the cracker being too dry, but uses a liquid wash with Mod I to clear her oral cavity well. Discussed option of advancing to regular solids. Pt considered it, but prefers to stay on mechanical soft diet this admission, largely due to her preference for her food to be pre-cut from a self-feeding standpoint. SLP will therefore s/o but would encourage her to gradually work back to regular solids when she is feeling ready.    HPI HPI: Pt is a 63 y.o. female admitted 02/23/24 via EMS for possible drug overdose. MRI negative. CXR concerning for PNA. PMH: anxiety, bronchitis, cervical cancer, depression, hyperlipidemia, HTN, lyme disease, migraine, ovarian cancer; ST recommending Dysphagia 3/thin liquid diet during swallow evaluation.  ST f/u for dysphagia tx/potential upgrade of diet.      SLP Plan  All goals met      Recommendations for follow up therapy are one component of a multi-disciplinary discharge planning process, led by the attending physician.  Recommendations may be updated based on patient status, additional functional criteria and insurance authorization.    Recommendations  Diet recommendations: Dysphagia 3 (mechanical soft);Thin liquid (can advanve to regular per pt preference) Liquids provided via: Cup;Straw Medication Administration: Whole meds with puree Supervision: Staff to assist with self feeding;Intermittent supervision to cue for compensatory strategies Compensations: Minimize environmental distractions;Slow rate;Small  sips/bites Postural Changes and/or Swallow Maneuvers: Seated upright 90 degrees                  Oral care BID     Dysphagia, unspecified (R13.10)     All goals met     Mahala Menghini., M.A. CCC-SLP Acute Rehabilitation Services Office [phone]  Secure chat preferred   03/09/2024, 10:27 AM

## 2024-03-09 NOTE — Progress Notes (Signed)
 Physical Therapy Treatment Patient Details Name: Sharon Huffman MRN: [id number] DOB: 01-04-1961 Today's Date: 03/09/2024   History of Present Illness Pt is a 63 y.o. female admitted 02/23/24 via EMS for possible drug overdose. PMH: anxiety, bronchitis, cervical cancer, depression, hyperlipidemia, HTN, lyme disease, migraine, ovarian cancer.    PT Comments  Pt endorses buttocks and R shoulder pain, but otherwise endorses feeling well. Pt ambulatory for great hallway distance with use of RW, requires min safety cues but no physical assist. Pt eager to d/c.     If plan is discharge home, recommend the following: Assistance with cooking/housework;Direct supervision/assist for medications management;Assist for transportation;Help with stairs or ramp for entrance;Supervision due to cognitive status;A little help with walking and/or transfers;A little help with bathing/dressing/bathroom   Can travel by private vehicle     No  Equipment Recommendations  Rolling walker (2 wheels)    Recommendations for Other Services       Precautions / Restrictions Precautions Precautions: Fall Recall of Precautions/Restrictions: Impaired Precaution/Restrictions Comments: Recruitment consultant Restrictions Weight Bearing Restrictions Per Provider Order: No     Mobility  Bed Mobility Overal bed mobility: Needs Assistance Bed Mobility: Supine to Sit, Sit to Supine     Supine to sit: Supervision Sit to supine: Supervision   General bed mobility comments: cues for safe lower onto back    Transfers Overall transfer level: Needs assistance Equipment used: Rolling walker (2 wheels) Transfers: Sit to/from Stand Sit to Stand: Supervision           General transfer comment: for safety, slow to rise    Ambulation/Gait Ambulation/Gait assistance: Supervision Gait Distance (Feet): 500 Feet Assistive device: Rolling walker (2 wheels) Gait Pattern/deviations: Step-through pattern, Decreased stride length,  Trunk flexed Gait velocity: decr     General Gait Details: cues for proximity to RW and hallway navigation   Stairs             Wheelchair Mobility     Tilt Bed    Modified Rankin (Stroke Patients Only)       Balance Overall balance assessment: Needs assistance Sitting-balance support: Feet supported, Bilateral upper extremity supported, No upper extremity supported Sitting balance-Leahy Scale: Fair     Standing balance support: During functional activity, No upper extremity supported Standing balance-Leahy Scale: Fair                              Hotel manager: No apparent difficulties Factors Affecting Communication: Difficulty expressing self (disorganized thoughts)  Cognition Arousal: Alert Behavior During Therapy: WFL for tasks assessed/performed   PT - Cognitive impairments: Attention, Problem solving, Safety/Judgement                         Following commands: Impaired Following commands impaired: Follows one step commands with increased time    Cueing Cueing Techniques: Verbal cues, Tactile cues  Exercises      General Comments General comments (skin integrity, edema, etc.): buttocks red with rash, NT applied Sharon Huffman's butt cream      Pertinent Vitals/Pain Pain Assessment Pain Assessment: Faces Faces Pain Scale: Hurts little more Pain Location: buttocks, hips, R shoulder Pain Descriptors / Indicators: Aching, Discomfort, Sore Pain Intervention(s): Limited activity within patient's tolerance, Monitored during session, Repositioned    Home Living  Prior Function            PT Goals (current goals can now be found in the care plan section) Acute Rehab PT Goals PT Goal Formulation: With patient Time For Goal Achievement: 03/12/24 Potential to Achieve Goals: Fair Progress towards PT goals: Progressing toward goals    Frequency    Min 3X/week       PT Plan      Co-evaluation              AM-PAC PT "6 Clicks" Mobility   Outcome Measure  Help needed turning from your back to your side while in a flat bed without using bedrails?: A Little Help needed moving from lying on your back to sitting on the side of a flat bed without using bedrails?: A Little Help needed moving to and from a bed to a chair (including a wheelchair)?: A Little Help needed standing up from a chair using your arms (e.g., wheelchair or bedside chair)?: A Little Help needed to walk in hospital room?: A Little Help needed climbing 3-5 steps with a railing? : A Little 6 Click Score: 18    End of Session   Activity Tolerance: Patient tolerated treatment well Patient left: in bed;with call bell/phone within reach;with bed alarm set;with nursing/sitter in room Nurse Communication: Mobility status PT Visit Diagnosis: Other abnormalities of gait and mobility (R26.89);Muscle weakness (generalized) (M62.81);Difficulty in walking, not elsewhere classified (R26.2)     Time: 1440-1510 PT Time Calculation (min) (ACUTE ONLY): 30 min  Charges:    $Gait Training: 8-22 mins $Therapeutic Activity: 8-22 mins PT General Charges $$ ACUTE PT VISIT: 1 Visit                     Sharon Huffman, PT DPT Acute Rehabilitation Services Secure Chat Preferred  Office [phone]    Sharon Huffman Sharon Huffman 03/09/2024, 4:24 PM

## 2024-03-09 NOTE — Consult Note (Signed)
 [facility] Psychiatric Consult Follow up  Patient Name: .Sharon Huffman  MRN: [id number]  DOB: 07-Jun-1961  Consult Order details:  Orders (From admission, onward)     Start     Ordered   02/24/24 1553  IP CONSULT TO PSYCHIATRY       Ordering Provider: Barnetta Chapel, MD  Provider:  (Not yet assigned)  Question Answer Comment  Location [facility]   Reason for Consult? intentional drug overdose      02/24/24 1552             Mode of Visit: In person    Psychiatry Consult Evaluation  Service Date: March 09, 2024 LOS:  LOS: 14 days  Chief Complaint "I just need to get over saying that.  I feel pretty good.  I'm assuming I tried to kill myself because I ended up in the hospital for five months," and then confusion.  Primary Psychiatric Diagnoses  Major depressive disorder, recurrent, severe  2.  Delirium 3.  General anxiety d/o  Assessment  Sharon Huffman is a 63 y.o. female admitted: Medicallyfor 02/23/2024  7:22 PM for intentional overdose. She carries the psychiatric diagnoses of MDD and GAD and has a past medical history of asthma, hypothyroidism, AKI, chronic fatigue., and hyperlipidemia   Her current presentation of intermittent confusion with agitation is most consistent with delirium. She meets criteria for MDD based on grief issues, difficulty coping, and suicide attempt.  Current outpatient psychotropic medications include Xanax, Prozac, and Adderall and historically she has had a low response to these medications. She was compliant with medications prior to admission as evidenced by husband's report except she was overtaking them. On initial examination, patient had a brief moment of clarity but mostly confused. Please see plan below for detailed recommendations.    03/07/24: On reassessment today, patient found ambulating from the bathroom with a walker. She presents with a big and welcoming smile. Patient reports a restful night. Although she denies  having suicidal ideation, plan or intent at this time, however she is still struggling with the death of her mother. She ruminates about their times together and about her death. Patient agrees to that fact that she needs further mental health stabilization and particularly grief support, and she believes that her neighbors and nurses she met here at the hospital will provide her with this.    While she appears to have been demonstrating an improved insight about her presenting problem, she also seems to have an unrealistic expectations of others that may not be available to her when she discharges. Patient was educated on the need for inpatient psychiatric admission to maintain safety and stabilization and for proper discharge planning and outpatient follow up. She reluctantly agrees with the plan at this time. We will increase Lexapro to 10 mg daily as she appears to be stable on it and without side effect. Creatinine level is trending down today 3/22 at 1.08. We recommend inpatient hospitalization when medically stable.   03/09/24: Patient was seen this morning and conversation was appropriate, though she was noted to be resting initially. She denied suicidal or homicidal ideation, auditory or visual hallucinations, and did not appear to be in acute distress.  Discharge planning was revisited. The patient was informed that the current plan remains either inpatient hospital or transition to outpatient care (IOP/PHP), as she has been in an acute care setting for over two weeks with medication and psychriatric stabilization achieved. She was educated on the  benefits of outpatient services, including coping skills development, behavioral modification, grief support, and Acceptance and Commitment Therapy--all of which can be accessed in the comfort of her home. Given her continued denial of any suicide attempt and lack of acute symptoms, further inpatient stay offers limited benefit.   Attempts to contact her  husband to review the treatment and safety plan were unsuccessful. Per the patient, he works from home and can provide 24-hour supervision. Safety measures will be de-escalated in preparation for discharge. Psychiatry will follow up tomorrow to continue discharge planning.    While the patient has demonstrated significant improvement, especially in her ability to communicate and engage appropriately, she may no longer require inpatient care for the acute exacerbation of psychiatric symptoms and unintentional overdose. Therefore, I recommend continuing to gradually reduce safety measures, providing her the opportunity to show that she can maintain her own safety considering she has had a Recruitment consultant for >15 days. This has also allowed the consult team to help her get stabilized on her medications.    Diagnoses:  Active Hospital problems: Principal Problem:   Overdose Active Problems:   Depression   Hypophosphatemia   Hyperkalemia   Hypomagnesemia   ADHD   Aspiration pneumonia (HCC)   Nausea and vomiting   Dehydration   AKI (acute kidney injury) (HCC)   Hyperlipidemia   Hypothyroidism   Major depressive disorder, recurrent severe without psychotic features (HCC)   Acute urinary retention   Hypokalemia   Chronic pain    Plan   ## Psychiatric Medication Recommendations:  Continue Xanax 0.5 mg BID  Continue gabapentin 100 mg BID with potential to increase this Continue Zyprexa 2.5 mg BID Increase to Lexapro 10 mg daily   ## Medical Decision Making Capacity: Not specifically addressed in this encounter  ## Further Work-up:  -- most recent EKG on 03/04/2024 had QtC of 457 -- Pertinent labwork reviewed earlier this admission includes: CMP, CBC with diff, UDS, and U/A -- Elevated creatinine of 1.56 but repeated level on 3/21- 1.28  -- Creatinine level is trending down today 3/22 at 1.08.    ## Disposition:-- Plan Post Discharge/Psychiatric Care Follow-up resources inpatient vs  IOP. Patient endorses unintentional overdose. Will need to discuss with husband, develop safety plan and initiate referral to IOP/PHP for ongoing services.  ## Behavioral / Environmental: -Delirium Precautions: Delirium Interventions for Nursing and Staff: - RN to open blinds every AM. - To Bedside: Glasses, hearing aide, and pt's own shoes. Make available to patients. when possible and encourage use. - Encourage po fluids when appropriate, keep fluids within reach. - OOB to chair with meals. - Passive ROM exercises to all extremities with AM & PM care. - RN to assess orientation to person, time and place QAM and PRN. - Recommend extended visitation hours with familiar family/friends as feasible. - Staff to minimize disturbances at night. Turn off television when pt asleep or when not in use.    ## Safety and Observation Level:  - Based on my clinical evaluation, I estimate the patient to be at no risk of self harm in the current setting. - At this time, we recommend  routine. This decision is based on my review of the chart including patient's history and current presentation, interview of the patient, mental status examination, and consideration of suicide risk including evaluating suicidal ideation, plan, intent, suicidal or self-harm behaviors, risk factors, and protective factors. This judgment is based on our ability to directly address suicide risk, implement suicide prevention  strategies, and develop a safety plan while the patient is in the clinical setting. Please contact our team if there is a concern that risk level has changed.  CSSR Risk Category:C-SSRS RISK CATEGORY: No Risk  Suicide Risk Assessment: Patient has following modifiable risk factors for suicide: under treated depression , which we are addressing by adjusting medications. Patient has following non-modifiable or demographic risk factors for suicide: history of suicide attempt Patient has the following protective factors  against suicide: Access to outpatient mental health care and Supportive family  Thank you for this consult request. Recommendations have been communicated to the primary team.  We will continue to follow at this time.   Maryagnes Amos, FNP       History of Present Illness  Relevant Aspects of [facility] Course:  Admitted on 02/23/2024 for intentional overdose. They are delirious with some improvement today.   Patient Report:  03/08/24: Patient found resting in bed and in no acute distress. She denied SI/HI/AVH, and did nit mention her mother on this brief encounter.   03/09/2024:Patient seen and assessed by this psychiatric provider. She was sleeping soundly about 11am this morning. She did awake easily and was found to be very responsive and engaging.Patient denies her admission at as a suicide attempt, and considers it be poor management of medication that led to an unintentional overdose. She reports she usually completes her meds for one week at a time, after taking her medications that day she filled it back up and couldn't remember if she took it. She endorses some forgetfulness and contributes this to her grief since the passing of her mom in January(Alzheimer). She reports being her primary care giver for almost 10 years. She denies any history of suicide attempts, or recent suicidal ideations prior to her admission. She does provide permission to speak with her husband, and seems to be open to outpatient therapy and or intensive programming vs inpatient.   Psych ROS:  Depression:  some sadness, ruminating on mother's death Anxiety:  Occasionally  Mania (lifetime and current): Denies Psychosis: (lifetime and current): Not present  Collateral information:  Her husband at her bedside reported she was "more subdued" and less agitated. "She kinda has some data at times, facts are buried with the confusion". He does feel she is doing better physically without so many medications ,  especially with opiates. Contributes the client to downward spiraling the death of her mother 5 weeks ago that she was caring for. This is when she started over taking her medications.   Review of Systems  Neurological:  Negative for sensory change.  Psychiatric/Behavioral:  Positive for depression. Negative for hallucinations, substance abuse and suicidal ideas. The patient is not nervous/anxious and does not have insomnia.   All other systems reviewed and are negative.    Psychiatric and Social History  Psychiatric History:  Information collected from patient, chart, sitter, and husband (3/12)  Prev Dx/Sx: MDD, grief, GAD, ADHD Current Psych Provider: [facility] Meds (current): Xanax, Prozac, Adderall Previous Med Trials: unknown Therapy: UTA  Prior Psych Hospitalization: none  Prior Self Harm: UTA Prior Violence: UTA  Family Psych History: UTA Family Hx suicide: UTA  Social History:  Living Situation: lives with her husband  Access to weapons/lethal means: none   Substance History UTA Her husband reported she was overtaking her medication at home   Exam Findings  Physical Exam:  Vital Signs:  Temp:  [98.3 F (36.8 C)-98.9 F (37.2 C)] 98.6 F (37  C) (03/24 2044) Pulse Rate:  [92-105] 92 (03/24 2044) Resp:  [16-20] 16 (03/24 2044) BP: (106-130)/(53-73) 106/53 (03/24 2044) SpO2:  [94 %-99 %] 98 % (03/24 2044) Blood pressure (!) 106/53, pulse 92, temperature 98.6 F (37 C), temperature source Oral, resp. rate 16, height 5\' 5"  (1.651 m), weight 65.3 kg, SpO2 98%. Body mass index is 23.96 kg/m.  Physical Exam Vitals and nursing note reviewed.  HENT:     Head: Normocephalic.     Nose: Nose normal.  Pulmonary:     Effort: Pulmonary effort is normal.  Musculoskeletal:     Cervical back: Normal range of motion.  Neurological:     Mental Status: She is alert.  Psychiatric:        Mood and Affect: Mood normal.        Behavior: Behavior normal.      Mental Status Exam: General Appearance: Neat and hospital gown,neat,  laying quietly in bed  Orientation:  person, place, time and situation  Memory:  Immediate;   Good Recent;   Good Remote;   Good  Concentration:  Concentration: Good and Attention Span: Good  Recall:  Good  Attention  Good  Eye Contact:  Good  Speech:  Normal Rate   Language:  Good  Volume:  Normal  Mood: "good"  Affect:   Bright  Thought Process:  Coherent, Goal Directed, and Linear  Thought Content:  WDL and Logical  Suicidal Thoughts:  No, denies  Homicidal Thoughts: Denies  Judgement:  Good  Insight:  Fair  Psychomotor Activity:  Normal  Akathisia:  No  Fund of Knowledge:  Fair      Assets:  Housing Leisure Time Resilience Social Support  Cognition:  WNL  ADL's:  Intact with minimal assistance  AIMS (if indicated):        Other History   These have been pulled in through the EMR, reviewed, and updated if appropriate.  Family History:  The patient's family history includes Dementia in her mother; Heart attack in her father; Heart disease in her father; Hyperlipidemia in her mother; Hypertension in her mother.  Medical History: Past Medical History:  Diagnosis Date   ADHD    Anxiety    Bronchitis, chronic (HCC)    Cervical cancer (HCC)    Depression    Depression    Hyperlipidemia    Hypertension    Insomnia disorder    Lyme disease    Migraine    Ovarian cancer (HCC)    Palpitations     Surgical History: Past Surgical History:  Procedure Laterality Date   ABDOMINAL HYSTERECTOMY  1994   ANTERIOR CERVICAL DECOMP/DISCECTOMY FUSION  2000   AUGMENTATION MAMMAPLASTY Bilateral 1998   BREAST ENHANCEMENT SURGERY Bilateral    EYE SURGERY  2016   PVD     Medications:   Current Facility-Administered Medications:    ALPRAZolam (XANAX) tablet 0.5 mg, 0.5 mg, Oral, BID, Lord, Jamison Y, NP, 0.5 mg at 03/09/24 2039   aspirin EC tablet 81 mg, 81 mg, Oral, Daily, Skip Mayer  A, MD, 81 mg at 03/09/24 1610   Chlorhexidine Gluconate Cloth 2 % PADS 6 each, 6 each, Topical, Q0600, Albertine Grates, MD, 6 each at 03/08/24 1125   enoxaparin (LOVENOX) injection 40 mg, 40 mg, Subcutaneous, Q24H, Rodolph Bong, MD, 40 mg at 03/09/24 2039   escitalopram (LEXAPRO) tablet 10 mg, 10 mg, Oral, Daily, Bolaji, Adepeju, NP, 10 mg at 03/09/24 0828   famotidine (PEPCID) tablet 20 mg, 20  mg, Oral, QHS, Rodolph Bong, MD, 20 mg at 03/09/24 2039   gabapentin (NEURONTIN) capsule 100 mg, 100 mg, Oral, BID, Shaune Pollack, Jamison Y, NP, 100 mg at 03/09/24 2039   Gerhardt's butt cream, , Topical, PRN, Rodolph Bong, MD, 1 Application at 03/07/24 0630   levothyroxine (SYNTHROID) tablet 50 mcg, 50 mcg, Oral, QAC breakfast, Rodolph Bong, MD, 50 mcg at 03/09/24 1610   loperamide (IMODIUM) capsule 2 mg, 2 mg, Oral, PRN, Rolly Salter, MD, 2 mg at 03/09/24 1712   midodrine (PROAMATINE) tablet 5 mg, 5 mg, Oral, TID WC, Rolly Salter, MD, 5 mg at 03/09/24 1712   morphine (MS CONTIN) 12 hr tablet 15 mg, 15 mg, Oral, Q12H, Rodolph Bong, MD, 15 mg at 03/09/24 2100   multivitamin with minerals tablet 1 tablet, 1 tablet, Oral, Q24H, Lord, Jamison Y, NP, 1 tablet at 03/09/24 1315   OLANZapine (ZYPREXA) tablet 2.5 mg, 2.5 mg, Oral, QHS, Hebishi, Galen Manila, MD, 2.5 mg at 03/09/24 2043   oxyCODONE (Oxy IR/ROXICODONE) immediate release tablet 5 mg, 5 mg, Oral, Q8H PRN, Rodolph Bong, MD, 5 mg at 03/09/24 1514   pantoprazole (PROTONIX) EC tablet 40 mg, 40 mg, Oral, Daily, Rodolph Bong, MD, 40 mg at 03/09/24 9604   polyethylene glycol (MIRALAX / GLYCOLAX) packet 17 g, 17 g, Oral, Daily PRN, Rodolph Bong, MD   rosuvastatin (CRESTOR) tablet 10 mg, 10 mg, Oral, Daily, Rodolph Bong, MD, 10 mg at 03/09/24 5409   senna-docusate (Senokot-S) tablet 1 tablet, 1 tablet, Oral, QHS PRN, Rodolph Bong, MD   sodium chloride flush (NS) 0.9 % injection 10-40 mL, 10-40 mL, Intracatheter,  Q12H, Rodolph Bong, MD, 10 mL at 03/09/24 8119   sodium chloride flush (NS) 0.9 % injection 10-40 mL, 10-40 mL, Intracatheter, PRN, Rodolph Bong, MD   tamsulosin [facility]) capsule 0.4 mg, 0.4 mg, Oral, QPC breakfast, Rolly Salter, MD, 0.4 mg at 03/09/24 1478   thiamine (VITAMIN B1) tablet 100 mg, 100 mg, Oral, Daily, Charm Rings, NP, 100 mg at 03/09/24 2956  Allergies: Allergies  Allergen Reactions   Amlodipine Swelling and Other (See Comments)    Leg edema and fluid retention   Atorvastatin Other (See Comments)    Other reaction(s): back ache   Bystolic [Nebivolol Hcl] Other (See Comments)    sweat and get clammy   Carbamazepine Other (See Comments)    rash   Mirtazapine Other (See Comments)    Other reaction(s): edema, vivid nightmares, patient was unsure    Penicillins Nausea And Vomiting   Sulfacetamide Nausea Only    Maryagnes Amos, FNP

## 2024-03-10 DIAGNOSIS — F332 Major depressive disorder, recurrent severe without psychotic features: Secondary | ICD-10-CM | POA: Diagnosis not present

## 2024-03-10 MED ORDER — ASPIRIN 81 MG PO TBEC: 81.0000 mg | DELAYED_RELEASE_TABLET | Freq: Every day | ORAL | 0 refills | Status: DC

## 2024-03-10 MED ORDER — ESCITALOPRAM OXALATE 10 MG PO TABS: 10.0000 mg | ORAL_TABLET | Freq: Every day | ORAL | 0 refills | Status: DC

## 2024-03-10 MED ORDER — OLANZAPINE 2.5 MG PO TABS: 2.5000 mg | ORAL_TABLET | Freq: Every day | ORAL | 0 refills | Status: DC

## 2024-03-10 MED ORDER — FUROSEMIDE 20 MG PO TABS: 20.0000 mg | ORAL_TABLET | Freq: Every day | ORAL | 0 refills | Status: AC

## 2024-03-10 MED ORDER — FUROSEMIDE 20 MG PO TABS
20.0000 mg | ORAL_TABLET | Freq: Every day | ORAL | Status: DC
  Administered 2024-03-10: 20 mg via ORAL
  Filled 2024-03-10: qty 1

## 2024-03-10 MED ORDER — VITAMIN B-1 100 MG PO TABS: 100.0000 mg | ORAL_TABLET | Freq: Every day | ORAL | 0 refills | Status: AC

## 2024-03-10 MED ORDER — ALPRAZOLAM 1 MG PO TABS: 0.5000 mg | ORAL_TABLET | Freq: Two times a day (BID) | ORAL | Status: DC | PRN

## 2024-03-10 MED ORDER — GABAPENTIN 100 MG PO CAPS: 100.0000 mg | ORAL_CAPSULE | Freq: Two times a day (BID) | ORAL | 0 refills | Status: DC

## 2024-03-10 MED ORDER — ADULT MULTIVITAMIN W/MINERALS CH: 1.0000 | ORAL_TABLET | ORAL | 0 refills | Status: AC

## 2024-03-10 MED ORDER — ASPIRIN 81 MG PO TBEC: 81.0000 mg | DELAYED_RELEASE_TABLET | Freq: Every day | ORAL | 0 refills | Status: AC

## 2024-03-10 NOTE — Progress Notes (Signed)
 AVS reviewed.  Patient very anxious for discharge.  All Questions answered.  Patient awaiting Ride.

## 2024-03-10 NOTE — Consult Note (Signed)
 [facility] Psychiatric Consult Follow up  Patient Name: .Sharon Huffman  MRN: [id number]  DOB: 1961-03-09  Consult Order details:  Orders (From admission, onward)     Start     Ordered   02/24/24 1553  IP CONSULT TO PSYCHIATRY       Ordering Provider: Barnetta Chapel, MD  Provider:  (Not yet assigned)  Question Answer Comment  Location [facility]   Reason for Consult? intentional drug overdose      02/24/24 1552             Mode of Visit: In person    Psychiatry Consult Evaluation  Service Date: March 10, 2024 LOS:  LOS: 15 days  Chief Complaint:  The client was confused and delirious initially  Primary Psychiatric Diagnoses  Major depressive disorder, recurrent, severe  2.  Delirium 3.  General anxiety d/o  Assessment  Sharon Huffman is a 63 y.o. female admitted: Medicallyfor 02/23/2024  7:22 PM for an overdose. She carries the psychiatric diagnoses of MDD and GAD and has a past medical history of asthma, hypothyroidism, AKI, chronic fatigue., and hyperlipidemia   On admission: Her current presentation of intermittent confusion with agitation is most consistent with delirium. She meets criteria for MDD based on grief issues, difficulty coping, and questionable overdose.  Current outpatient psychotropic medications include Xanax, Prozac, and Adderall and historically she has had a low response to these medications. She was compliant with medications prior to admission as evidenced by husband's report except she was overtaking them. On initial examination, patient had a brief moment of clarity but mostly confused. Please see plan below for detailed recommendations.   03/07/24: On reassessment today, patient found ambulating from the bathroom with a walker. She presents with a big and welcoming smile. Patient reports a restful night. Although she denies having suicidal ideation, plan or intent at this time, however she is still struggling with the death of her  mother. She ruminates about their times together and about her death. Patient agrees to that fact that she needs further mental health stabilization and particularly grief support, and she believes that her neighbors and nurses she met here at the hospital will provide her with this.    While she appears to have been demonstrating an improved insight about her presenting problem, she also seems to have an unrealistic expectations of others that may not be available to her when she discharges. Patient was educated on the need for inpatient psychiatric admission to maintain safety and stabilization and for proper discharge planning and outpatient follow up. She reluctantly agrees with the plan at this time. We will increase Lexapro to 10 mg daily as she appears to be stable on it and without side effect. Creatinine level is trending down today 3/22 at 1.08. We recommend inpatient hospitalization when medically stable.   03/09/24: Patient was seen this morning and conversation was appropriate, though she was noted to be resting initially. She denied suicidal or homicidal ideation, auditory or visual hallucinations, and did not appear to be in acute distress.  Discharge planning was revisited. The patient was informed that the current plan remains either inpatient hospital or transition to outpatient care (IOP/PHP), as she has been in an acute care setting for over two weeks with medication and psychriatric stabilization achieved. She was educated on the benefits of outpatient services, including coping skills development, behavioral modification, grief support, and Acceptance and Commitment Therapy--all of which can be accessed in the comfort  of her home. Given her continued denial of any suicide attempt and lack of acute symptoms, further inpatient stay offers limited benefit.   Attempts to contact her husband to review the treatment and safety plan were unsuccessful. Per the patient, he works from home and can  provide 24-hour supervision. Safety measures will be de-escalated in preparation for discharge. Psychiatry will follow up tomorrow to continue discharge planning.   While the patient has demonstrated significant improvement, especially in her ability to communicate and engage appropriately, she may no longer require inpatient care for the acute exacerbation of psychiatric symptoms and unintentional overdose. Therefore, I recommend continuing to gradually reduce safety measures, providing her the opportunity to show that she can maintain her own safety considering she has had a Recruitment consultant for >15 days. This has also allowed the consult team to help her get stabilized on her medications.    03/10/24: The client was sitting in bed prior to the assessment, smiling appropriately.  She engaged easily in conversation denied depression, "I haven't had any".  When inquired about taking too many of her medications prior to admission, she adamantly denied this was a suicide attempt, "I would never try to kill myself.  I was raised if you kill yourself, you will go to hell."  She believes what happened prior to admission was she took her daily pills from her pill box and then got distracted by her dog.  Then, she refilled her pill box and took another day's medications as she was distracted.  She has anxiety "every now and then, when I think about all the things I need to take care of at home" while she is in the hospital.  Denies hallucinations, paranoia, and substance abuse.  She is agreeable to go to therapy to process her grief over the mother passing a few weeks ago.  Sharon Huffman had been caring for her mother who suffered with Alzheimer's dementia for the past ten and a half years. Discussed medication management and she was agreeable to have her husband manage her medication pill box after discharge to prevent this from occurring again.  Permission obtained to speak to her husband.  He reported no safety concerns  regarding [name] discharging home.  "I haven't seen her this good in over a year".  He is pleased with her progress and is agreeable to manage her medications.  While she has been in the hospital, he cleared out her medication bottles and extra pill boxes.  He talked to Daralyn about only keeping one box that he will manage prior to our conversation and she was agreeable.  When asked about guns in the home, he stated he has the ammunition and guns secured in his office.  He requested any resources for therapy and medication management for Asiyah which this provider placed in the discharge instructions along with letting him know to call the number for behavioral services on his [place] card.    Diagnoses:  Active Hospital problems: Principal Problem:   Overdose Active Problems:   Depression   Hypophosphatemia   Hyperkalemia   Hypomagnesemia   ADHD   Aspiration pneumonia (HCC)   Nausea and vomiting   Dehydration   AKI (acute kidney injury) (HCC)   Hyperlipidemia   Hypothyroidism   Major depressive disorder, recurrent severe without psychotic features (HCC)   Acute urinary retention   Hypokalemia   Chronic pain    Plan   ## Psychiatric Medication Recommendations:  Continue Xanax 0.5 mg BID  Continue gabapentin  100 mg BID  Continue Zyprexa 2.5 mg at bedtime Increase to Lexapro 10 mg daily  Follow up with outpatient therapy and services  ## Medical Decision Making Capacity: Not specifically addressed in this encounter  ## Further Work-up:  -- most recent EKG on 03/04/2024 had QtC of 457 -- Pertinent labwork reviewed earlier this admission includes: CMP, CBC with diff, UDS, and U/A -- Elevated creatinine of 1.56 but repeated level on 3/21- 1.28  -- Creatinine level is trending down today 3/22 at 1.08.    ## Disposition:-- Discharge home with medication management by her husband with follow up care to therapy and psych services.   ## Safety and Observation Level:  - Based on my clinical  evaluation, I estimate the patient to be at no risk of self harm in the current setting. - At this time, we recommend  routine. This decision is based on my review of the chart including patient's history and current presentation, interview of the patient, mental status examination, and consideration of suicide risk including evaluating suicidal ideation, plan, intent, suicidal or self-harm behaviors, risk factors, and protective factors. This judgment is based on our ability to directly address suicide risk, implement suicide prevention strategies, and develop a safety plan while the patient is in the clinical setting. Please contact our team if there is a concern that risk level has changed.  CSSR Risk Category:C-SSRS RISK CATEGORY: No Risk  Suicide Risk Assessment: Patient has following modifiable risk factors for suicide: under treated depression , which we are addressing by adjusting medications. Patient has following non-modifiable or demographic risk factors for suicide: none Patient has the following protective factors against suicide: Access to outpatient mental health care and Supportive family  Thank you for this consult request. Recommendations have been communicated to the primary team.  We will sign off at this time.   Nanine Means, NP       History of Present Illness  Relevant Aspects of [facility] Course:  Admitted on 02/23/2024 for intentional overdose. They are delirious with some improvement today.   Patient Report:  03/08/24: Patient found resting in bed and in no acute distress. She denied SI/HI/AVH, and did nit mention her mother on this brief encounter.   03/10/24: The client was sitting in bed prior to the assessment, smiling appropriately.  She engaged easily in conversation denied depression, "I haven't had any".  When inquired about taking too many of her medications prior to admission, she adamantly denied this was a suicide attempt, "I would never try to kill  myself.  I was raised if you kill yourself, you will go to hell."  She believes what happened prior to admission was she took her daily pills from her pill box and then got distracted by her dog.  Then, she refilled her pill box and took another day's medications as she was distracted.  She has anxiety "every now and then, when I think about all the things I need to take care of at home" while she is in the hospital.  Denies hallucinations, paranoia, and substance abuse.  She is agreeable to go to therapy to process her grief over the mother passing a few weeks ago.  Willena had been caring for her mother who suffered with Alzheimer's dementia for the past ten and a half years. Discussed medication management and she was agreeable to have her husband manage her medication pill box after discharge to prevent this from occurring again.  Permission obtained to speak to her  husband.  He reported no safety concerns regarding Dhalia discharging home.  "I haven't seen her this good in over a year".  He is pleased with her progress and is agreeable to manage her medications.  While she has been in the hospital, he cleared out her medication bottles and extra pill boxes.  He talked to Tyliyah about only keeping one box that he will manage prior to our conversation and she was agreeable.  When asked about guns in the home, he stated he has the ammunition and guns secured in his office.  He requested any resources for therapy and medication management for Gennifer which this provider placed in the discharge instructions along with letting him know to call the number for behavioral services on his [place] card.   Psych ROS:  Depression:  denied Anxiety:  Occasionally  Mania (lifetime and current): Denied Psychosis: (lifetime and current): Not present.   Review of Systems  Neurological:  Negative for sensory change.  Psychiatric/Behavioral:  Negative for hallucinations, substance abuse and suicidal ideas. The patient is not  nervous/anxious and does not have insomnia.   All other systems reviewed and are negative.    Psychiatric and Social History  Psychiatric History:  Information collected from patient, chart, sitter, and husband (3/12)  Prev Dx/Sx: MDD, grief, GAD, ADHD Current Psych Provider: [facility] Meds (current): Xanax, Prozac, Adderall Previous Med Trials: unknown Therapy: none currently  Prior Psych Hospitalization: none  Prior Self Harm: none Prior Violence: none  Family Psych History: none Family Hx suicide: none  Social History:  Living Situation: lives with her husband  Access to weapons/lethal means: none, husband secured his guns  Substance History Denied  Exam Findings  Physical Exam:  Vital Signs:  Temp:  [98.1 F (36.7 C)-98.6 F (37 C)] 98.3 F (36.8 C) (03/25 0814) Pulse Rate:  [92-104] 104 (03/25 0814) Resp:  [16-18] 18 (03/25 0814) BP: (100-121)/(53-57) 121/55 (03/25 0814) SpO2:  [96 %-98 %] 96 % (03/25 0814) Blood pressure (!) 121/55, pulse (!) 104, temperature 98.3 F (36.8 C), temperature source Oral, resp. rate 18, height 5\' 5"  (1.651 m), weight 65.3 kg, SpO2 96%. Body mass index is 23.96 kg/m.  Physical Exam Vitals and nursing note reviewed.  HENT:     Head: Normocephalic.     Nose: Nose normal.  Pulmonary:     Effort: Pulmonary effort is normal.  Musculoskeletal:     Cervical back: Normal range of motion.  Neurological:     Mental Status: She is alert.  Psychiatric:        Mood and Affect: Mood normal.        Behavior: Behavior normal.     Mental Status Exam: General Appearance: Neat and hospital gown,neat,  laying quietly in bed  Orientation:  person, place, time and situation  Memory:  Immediate;   Good Recent;   Good Remote;   Good  Concentration:  Concentration: Good and Attention Span: Good  Recall:  Good  Attention  Good  Eye Contact:  Good  Speech:  Normal Rate   Language:  Good  Volume:  Normal  Mood: "good"  Affect:    Bright  Thought Process:  Coherent, Goal Directed, and Linear  Thought Content:  WDL and Logical  Suicidal Thoughts:  No, denies  Homicidal Thoughts: Denies  Judgement:  Good  Insight:  Fair  Psychomotor Activity:  Normal  Akathisia:  No  Fund of Knowledge:  Fair      Assets:  Housing Leisure  Time Resilience Social Support  Cognition:  WNL  ADL's:  Intact with minimal assistance  AIMS (if indicated):        Other History   These have been pulled in through the EMR, reviewed, and updated if appropriate.  Family History:  The patient's family history includes Dementia in her mother; Heart attack in her father; Heart disease in her father; Hyperlipidemia in her mother; Hypertension in her mother.  Medical History: Past Medical History:  Diagnosis Date   ADHD    Anxiety    Bronchitis, chronic (HCC)    Cervical cancer (HCC)    Depression    Depression    Hyperlipidemia    Hypertension    Insomnia disorder    Lyme disease    Migraine    Ovarian cancer (HCC)    Palpitations     Surgical History: Past Surgical History:  Procedure Laterality Date   ABDOMINAL HYSTERECTOMY  1994   ANTERIOR CERVICAL DECOMP/DISCECTOMY FUSION  2000   AUGMENTATION MAMMAPLASTY Bilateral 1998   BREAST ENHANCEMENT SURGERY Bilateral    EYE SURGERY  2016   PVD     Medications:   Current Facility-Administered Medications:    ALPRAZolam (XANAX) tablet 0.5 mg, 0.5 mg, Oral, BID, Xcaret Morad Y, NP, 0.5 mg at 03/10/24 1610   aspirin EC tablet 81 mg, 81 mg, Oral, Daily, Skip Mayer A, MD, 81 mg at 03/10/24 0807   Chlorhexidine Gluconate Cloth 2 % PADS 6 each, 6 each, Topical, Q0600, Albertine Grates, MD, 6 each at 03/08/24 1125   enoxaparin (LOVENOX) injection 40 mg, 40 mg, Subcutaneous, Q24H, Rodolph Bong, MD, 40 mg at 03/09/24 2039   escitalopram (LEXAPRO) tablet 10 mg, 10 mg, Oral, Daily, Bolaji, Adepeju, NP, 10 mg at 03/10/24 9604   famotidine (PEPCID) tablet 20 mg, 20 mg, Oral, QHS,  Rodolph Bong, MD, 20 mg at 03/09/24 2039   furosemide (LASIX) tablet 20 mg, 20 mg, Oral, Daily, Lynden Oxford M, MD, 20 mg at 03/10/24 1132   gabapentin (NEURONTIN) capsule 100 mg, 100 mg, Oral, BID, Charm Rings, NP, 100 mg at 03/10/24 5409   Gerhardt's butt cream, , Topical, PRN, Rodolph Bong, MD, 1 Application at 03/07/24 0630   levothyroxine (SYNTHROID) tablet 50 mcg, 50 mcg, Oral, QAC breakfast, Rodolph Bong, MD, 50 mcg at 03/10/24 8119   loperamide (IMODIUM) capsule 2 mg, 2 mg, Oral, PRN, Rolly Salter, MD, 2 mg at 03/10/24 0806   midodrine (PROAMATINE) tablet 5 mg, 5 mg, Oral, TID WC, Rolly Salter, MD, 5 mg at 03/10/24 1132   morphine (MS CONTIN) 12 hr tablet 15 mg, 15 mg, Oral, Q12H, Rodolph Bong, MD, 15 mg at 03/10/24 1478   multivitamin with minerals tablet 1 tablet, 1 tablet, Oral, Q24H, Brocha Gilliam Y, NP, 1 tablet at 03/10/24 1132   OLANZapine (ZYPREXA) tablet 2.5 mg, 2.5 mg, Oral, QHS, Hebishi, Galen Manila, MD, 2.5 mg at 03/09/24 2043   oxyCODONE (Oxy IR/ROXICODONE) immediate release tablet 5 mg, 5 mg, Oral, Q8H PRN, Rodolph Bong, MD, 5 mg at 03/09/24 1514   pantoprazole (PROTONIX) EC tablet 40 mg, 40 mg, Oral, Daily, Rodolph Bong, MD, 40 mg at 03/10/24 2956   polyethylene glycol (MIRALAX / GLYCOLAX) packet 17 g, 17 g, Oral, Daily PRN, Rodolph Bong, MD   rosuvastatin (CRESTOR) tablet 10 mg, 10 mg, Oral, Daily, Rodolph Bong, MD, 10 mg at 03/10/24 2130   senna-docusate (Senokot-S) tablet 1 tablet, 1 tablet,  Oral, QHS PRN, Rodolph Bong, MD   sodium chloride flush (NS) 0.9 % injection 10-40 mL, 10-40 mL, Intracatheter, Q12H, Rodolph Bong, MD, 10 mL at 03/10/24 1610   sodium chloride flush (NS) 0.9 % injection 10-40 mL, 10-40 mL, Intracatheter, PRN, Rodolph Bong, MD   tamsulosin [facility]) capsule 0.4 mg, 0.4 mg, Oral, QPC breakfast, Rolly Salter, MD, 0.4 mg at 03/10/24 9604   thiamine (VITAMIN B1) tablet 100 mg, 100  mg, Oral, Daily, Charm Rings, NP, 100 mg at 03/10/24 5409  Allergies: Allergies  Allergen Reactions   Amlodipine Swelling and Other (See Comments)    Leg edema and fluid retention   Atorvastatin Other (See Comments)    Other reaction(s): back ache   Bystolic [Nebivolol Hcl] Other (See Comments)    sweat and get clammy   Carbamazepine Other (See Comments)    rash   Mirtazapine Other (See Comments)    Other reaction(s): edema, vivid nightmares, patient was unsure    Penicillins Nausea And Vomiting   Sulfacetamide Nausea Only    Nanine Means, NP

## 2024-03-10 NOTE — Discharge Instructions (Signed)
 Therapy and Medication Management [facility]   Address: [address], [place], [place] [address] Phone: [phone]  [facility], [place]  Address: [address], [place], [place] [address] Phone: [phone]

## 2024-03-10 NOTE — Plan of Care (Signed)

## 2024-03-10 NOTE — Progress Notes (Signed)
 Occupational Therapy Treatment Patient Details Name: Sharon Huffman MRN: [id number] DOB: March 16, 1961 Today's Date: 03/10/2024   History of present illness Pt is a 63 y.o. female admitted 02/23/24 via EMS for possible drug overdose. PMH: anxiety, bronchitis, cervical cancer, depression, hyperlipidemia, HTN, lyme disease, migraine, ovarian cancer.   OT comments  Pt progressing well towards goals. Significant improvements in safety awareness and standing balance during ADLs. Pt continues to be limited by decreased strength and activity tolerance. With safety awareness progression, anticipate no follow up OT needs. Will continue to follow acutely to optimize independence.       If plan is discharge home, recommend the following:  A little help with walking and/or transfers;A little help with bathing/dressing/bathroom;Assistance with cooking/housework;Direct supervision/assist for medications management;Direct supervision/assist for financial management;Supervision due to cognitive status   Equipment Recommendations  BSC/3in1    Recommendations for Other Services      Precautions / Restrictions Precautions Precautions: Fall Recall of Precautions/Restrictions: Impaired Precaution/Restrictions Comments: Recruitment consultant Restrictions Weight Bearing Restrictions Per Provider Order: No       Mobility Bed Mobility Overal bed mobility: Needs Assistance Bed Mobility: Supine to Sit, Sit to Supine     Supine to sit: Supervision Sit to supine: Supervision        Transfers Overall transfer level: Needs assistance Equipment used: Rolling walker (2 wheels) Transfers: Sit to/from Stand Sit to Stand: Supervision     Step pivot transfers: Supervision     General transfer comment: Required 2 trials but able to complete without assistance     Balance Overall balance assessment: Needs assistance Sitting-balance support: Feet supported, Bilateral upper extremity supported, No upper extremity  supported Sitting balance-Leahy Scale: Fair     Standing balance support: During functional activity, No upper extremity supported Standing balance-Leahy Scale: Fair Standing balance comment: Able to dynamically shift wt while standing without                           ADL either performed or assessed with clinical judgement   ADL Overall ADL's : Needs assistance/impaired     Grooming: Wash/dry hands;Wash/dry face;Applying deodorant;Brushing hair;Supervision/safety;Standing Grooming Details (indicate cue type and reason): No LOB while standing at sink with no UE support                 Toilet Transfer: Supervision/safety;Ambulation;Regular Toilet;Rolling walker (2 wheels) Toilet Transfer Details (indicate cue type and reason): No use of GB or BSC Toileting- Clothing Manipulation and Hygiene: Supervision/safety;Sitting/lateral lean Toileting - Clothing Manipulation Details (indicate cue type and reason): Able to complete perineal hygiene seated on toilet with lateral leans, no LOB     Functional mobility during ADLs: Rolling walker (2 wheels);Supervision/safety      Extremity/Trunk Assessment Upper Extremity Assessment Upper Extremity Assessment: RUE deficits/detail RUE Deficits / Details: Shoulder flex & abd at approx 90 degrees RUE: Shoulder pain with ROM RUE Coordination: decreased gross motor   Lower Extremity Assessment Lower Extremity Assessment: Defer to PT evaluation LLE Deficits / Details: Pt's L knee buckles during mobillity        Vision   Vision Assessment?: No apparent visual deficits   Perception     Praxis     Communication Communication Communication: No apparent difficulties Factors Affecting Communication: Difficulty expressing self   Cognition Arousal: Alert Behavior During Therapy: WFL for tasks assessed/performed Cognition: Cognition impaired     Awareness: Intellectual awareness intact, Online awareness intact      Executive functioning impairment (  select all impairments): Problem solving OT - Cognition Comments: Cog shows significant improvement in safety awareness and ability to self-correct                 Following commands: Impaired Following commands impaired: Follows one step commands with increased time      Cueing   Cueing Techniques: Verbal cues, Tactile cues  Exercises      Shoulder Instructions       General Comments Bottom red and irritated, pt self applied [name]'s butt cream    Pertinent Vitals/ Pain       Pain Assessment Pain Assessment: Faces Faces Pain Scale: Hurts even more Pain Location: L knee Pain Descriptors / Indicators: Aching, Discomfort, Sore Pain Intervention(s): RN gave pain meds during session, Monitored during session   Frequency  Min 2X/week        Progress Toward Goals  OT Goals(current goals can now be found in the care plan section)     Acute Rehab OT Goals Patient Stated Goal: To go home OT Goal Formulation: With patient Time For Goal Achievement: 03/12/24 Potential to Achieve Goals: Good  Plan         AM-PAC OT "6 Clicks" Daily Activity     Outcome Measure   Help from another person eating meals?: None Help from another person taking care of personal grooming?: A Little Help from another person toileting, which includes using toliet, bedpan, or urinal?: A Little Help from another person bathing (including washing, rinsing, drying)?: A Little Help from another person to put on and taking off regular upper body clothing?: A Little Help from another person to put on and taking off regular lower body clothing?: A Little 6 Click Score: 19    End of Session Equipment Utilized During Treatment: Gait belt;Rolling walker (2 wheels)  OT Visit Diagnosis: Unsteadiness on feet (R26.81);Other abnormalities of gait and mobility (R26.89);Muscle weakness (generalized) (M62.81);Other symptoms and signs involving cognitive function    Activity Tolerance Patient tolerated treatment well   Patient Left in bed;with call bell/phone within reach;with bed alarm set;with nursing/sitter in room   Nurse Communication Mobility status;Patient requests pain meds        Time: 9562-1308 OT Time Calculation (min): 12 min  Charges: OT General Charges $OT Visit: 1 Visit OT Treatments $Self Care/Home Management : 8-22 mins  Ivor Messier, OT  Acute Rehabilitation Services Office [phone] Secure chat preferred   Marilynne Drivers 03/10/2024, 8:24 AM

## 2024-03-10 NOTE — Progress Notes (Signed)
 Mobility Specialist Progress Note:    03/10/24 1200  Mobility  Activity Ambulated with assistance in hallway  Level of Assistance Standby assist, set-up cues, supervision of patient - no hands on  Assistive Device Front wheel walker  Distance Ambulated (ft) 450 ft  Activity Response Tolerated well  Mobility Referral Yes  Mobility visit 1 Mobility  Mobility Specialist Start Time (ACUTE ONLY) 1205  Mobility Specialist Stop Time (ACUTE ONLY) 1212  Mobility Specialist Time Calculation (min) (ACUTE ONLY) 7 min   Pt received on EOB, agreeable to mobility. Asymptomatic throughout w/ no complaints. Took x2 standing rest breaks. Returned to room w/o fault. Pt left on EOB with call bell and all needs met. Sitter present.   D'Vante Earlene Plater Mobility Specialist Please contact via Special educational needs teacher or Rehab office at [phone]

## 2024-03-20 ENCOUNTER — Other Ambulatory Visit: Payer: Self-pay

## 2024-03-20 ENCOUNTER — Emergency Department (HOSPITAL_BASED_OUTPATIENT_CLINIC_OR_DEPARTMENT_OTHER)
Admission: EM | Admit: 2024-03-20 | Discharge: 2024-03-20 | Disposition: A | Discharge status: Home / Self Care | Attending: Emergency Medicine | Admitting: Emergency Medicine

## 2024-03-20 ENCOUNTER — Emergency Department (HOSPITAL_BASED_OUTPATIENT_CLINIC_OR_DEPARTMENT_OTHER)

## 2024-03-20 ENCOUNTER — Encounter (HOSPITAL_BASED_OUTPATIENT_CLINIC_OR_DEPARTMENT_OTHER): Payer: Self-pay | Admitting: Urology

## 2024-03-20 DIAGNOSIS — Z7982 Long term (current) use of aspirin: Secondary | ICD-10-CM | POA: Insufficient documentation

## 2024-03-20 DIAGNOSIS — R600 Localized edema: Secondary | ICD-10-CM | POA: Diagnosis not present

## 2024-03-20 DIAGNOSIS — M7122 Synovial cyst of popliteal space [Baker], left knee: Secondary | ICD-10-CM | POA: Diagnosis not present

## 2024-03-20 DIAGNOSIS — M7989 Other specified soft tissue disorders: Secondary | ICD-10-CM | POA: Diagnosis present

## 2024-03-20 LAB — COMPREHENSIVE METABOLIC PANEL WITH GFR
ALT: 20 U/L (ref 0–44)
AST: 26 U/L (ref 15–41)
Albumin: 3.4 g/dL — ABNORMAL LOW (ref 3.5–5.0)
Alkaline Phosphatase: 63 U/L (ref 38–126)
Anion gap: 11 (ref 5–15)
BUN: 18 mg/dL (ref 8–23)
CO2: 30 mmol/L (ref 22–32)
Calcium: 9.3 mg/dL (ref 8.9–10.3)
Chloride: 99 mmol/L (ref 98–111)
Creatinine, Ser: 1.43 mg/dL — ABNORMAL HIGH (ref 0.44–1.00)
GFR, Estimated: 41 mL/min — ABNORMAL LOW (ref >60)
Glucose, Bld: 129 mg/dL — ABNORMAL HIGH (ref 70–99)
Potassium: 3.5 mmol/L (ref 3.5–5.1)
Sodium: 140 mmol/L (ref 135–145)
Total Bilirubin: 0.9 mg/dL (ref 0.0–1.2)
Total Protein: 5.9 g/dL — ABNORMAL LOW (ref 6.5–8.1)

## 2024-03-20 LAB — CBC WITH DIFFERENTIAL/PLATELET
Abs Immature Granulocytes: 0.02 10*3/uL (ref 0.00–0.07)
Basophils Absolute: 0 10*3/uL (ref 0.0–0.1)
Basophils Relative: 0 %
Eosinophils Absolute: 0.3 10*3/uL (ref 0.0–0.5)
Eosinophils Relative: 5 %
HCT: 32.3 % — ABNORMAL LOW (ref 36.0–46.0)
Hemoglobin: 10.4 g/dL — ABNORMAL LOW (ref 12.0–15.0)
Immature Granulocytes: 0 %
Lymphocytes Relative: 13 %
Lymphs Abs: 0.7 10*3/uL (ref 0.7–4.0)
MCH: 31.6 pg (ref 26.0–34.0)
MCHC: 32.2 g/dL (ref 30.0–36.0)
MCV: 98.2 fL (ref 80.0–100.0)
Monocytes Absolute: 0.5 10*3/uL (ref 0.1–1.0)
Monocytes Relative: 10 %
Neutro Abs: 4 10*3/uL (ref 1.7–7.7)
Neutrophils Relative %: 72 %
Platelets: 247 10*3/uL (ref 150–400)
RBC: 3.29 MIL/uL — ABNORMAL LOW (ref 3.87–5.11)
RDW: 12.4 % (ref 11.5–15.5)
WBC: 5.5 10*3/uL (ref 4.0–10.5)
nRBC: 0 % (ref 0.0–0.2)

## 2024-03-20 LAB — TROPONIN I (HIGH SENSITIVITY): Troponin I (High Sensitivity): 8 ng/L (ref <18)

## 2024-03-20 LAB — BRAIN NATRIURETIC PEPTIDE: B Natriuretic Peptide: 54.5 pg/mL (ref 0.0–100.0)

## 2024-03-20 NOTE — ED Provider Notes (Signed)
 [facility] Provider Note   CSN: [id number] Arrival date & time: 03/20/24  4696     History  Chief Complaint  Patient presents with   Edema    Sharon Huffman is a 63 y.o. female.  Patient here with leg swelling the last couple days.  Sounds like she used to be on spironolactone but that was stopped at her recent hospital admission.  She is having swelling to both legs and shortness of breath.  Left leg little bit larger than her right leg.  She denies any chest pain.  She takes 20 mg of Lasix daily.  She denies any fever chills cough sputum production.  She denies any headache.  She has been wearing compression socks.  The history is provided by the patient.       Home Medications Prior to Admission medications   Medication Sig Start Date End Date Taking? Authorizing Provider  ALPRAZolam Prudy Feeler) 1 MG tablet Take 0.5 tablets (0.5 mg total) by mouth 2 (two) times daily as needed for anxiety. 03/10/24   Rolly Salter, MD  amphetamine-dextroamphetamine (ADDERALL XR) 30 MG 24 hr capsule Take 30 mg by mouth at bedtime. 01/03/21   [provider]  aspirin EC 81 MG tablet Take 1 tablet (81 mg total) by mouth daily. Swallow whole. 03/10/24   Rolly Salter, MD  Biotin 10 MG CAPS Take by mouth daily.    [provider]  celecoxib (CELEBREX) 200 MG capsule Take 200 mg by mouth 2 (two) times daily as needed for mild pain (pain score 1-3) or moderate pain (pain score 4-6). 02/02/24   [provider]  escitalopram (LEXAPRO) 10 MG tablet Take 1 tablet (10 mg total) by mouth daily. 03/10/24   Rolly Salter, MD  famotidine (PEPCID) 20 MG tablet One after supper Patient taking differently: Take 20 mg by mouth at bedtime. 03/20/19   Nyoka Cowden, MD  folic acid (FOLVITE) 1 MG tablet Take 1 mg by mouth daily.    [provider]  furosemide (LASIX) 20 MG tablet Take 1 tablet (20 mg total) by mouth daily. 03/11/24   Rolly Salter, MD  gabapentin (NEURONTIN) 100 MG capsule Take 1 capsule (100 mg total) by mouth 2 (two) times daily. 03/10/24   Rolly Salter, MD  HYDROcodone-acetaminophen [facility]) 10-325 MG tablet Take 0.5 tablets by mouth 2 (two) times daily. 12/22/23   [provider]  ipratropium (ATROVENT) 0.06 % nasal spray Place 2 sprays into both nostrils as needed.    [provider]  levothyroxine (SYNTHROID) 50 MCG tablet Take 50 mcg by mouth daily before breakfast.    [provider]  midodrine (PROAMATINE) 5 MG tablet USE AS DIRECTED 03/02/24   Yates Decamp, MD  morphine (MS CONTIN) 15 MG 12 hr tablet Take 15 mg by mouth 2 (two) times daily. 08/24/22   [provider]  Multiple Vitamin (MULTIVITAMIN WITH MINERALS) TABS tablet Take 1 tablet by mouth daily. 03/10/24   Rolly Salter, MD  OLANZapine (ZYPREXA) 2.5 MG tablet Take 1 tablet (2.5 mg total) by mouth at bedtime. 03/10/24   Rolly Salter, MD  pantoprazole (PROTONIX) 40 MG tablet TAKE 1 TABLET (40 MG TOTAL) BY MOUTH DAILY. TAKE 30-60 MIN BEFORE FIRST MEAL OF THE DAY 12/30/19   Nyoka Cowden, MD  rosuvastatin (CRESTOR) 10 MG tablet Take 10 mg by mouth daily.    [provider]  thiamine (VITAMIN  B-1) 100 MG tablet Take 1 tablet (100 mg total) by mouth daily. 03/10/24   Rolly Salter, MD      Allergies    Amlodipine, Atorvastatin, Bystolic [nebivolol hcl], Carbamazepine, Mirtazapine, Penicillins, and Sulfacetamide    Review of Systems   Review of Systems  Physical Exam Updated Vital Signs BP 123/78 (BP Location: Right Arm)   Pulse (!) 102   Temp 97.9 F (36.6 C)   Resp 18   Ht 5\' 5"  (1.651 m)   Wt 65.3 kg   SpO2 97%   BMI 23.96 kg/m  Physical Exam Vitals and nursing note reviewed.  Constitutional:      General: She is not in acute distress.    Appearance: She is well-developed. She is not ill-appearing.  HENT:     Head: Normocephalic and atraumatic.     Nose: Nose normal.     Mouth/Throat:      Mouth: Mucous membranes are moist.  Eyes:     Conjunctiva/sclera: Conjunctivae normal.     Pupils: Pupils are equal, round, and reactive to light.  Cardiovascular:     Rate and Rhythm: Normal rate and regular rhythm.     Pulses: Normal pulses.     Heart sounds: Normal heart sounds. No murmur heard. Pulmonary:     Effort: Pulmonary effort is normal. No respiratory distress.     Breath sounds: Normal breath sounds.  Abdominal:     Palpations: Abdomen is soft.     Tenderness: There is no abdominal tenderness.  Musculoskeletal:        General: No swelling.     Cervical back: Neck supple.  Skin:    General: Skin is warm and dry.     Capillary Refill: Capillary refill takes less than 2 seconds.     Comments: Pitting edema bilaterally in her lower extremities left greater than the right  Neurological:     General: No focal deficit present.     Mental Status: She is alert.  Psychiatric:        Mood and Affect: Mood normal.     ED Results / Procedures / Treatments   Labs (all labs ordered are listed, but only abnormal results are displayed) Labs Reviewed  CBC WITH DIFFERENTIAL/PLATELET - Abnormal; Notable for the following components:      Result Value   RBC 3.29 (*)    Hemoglobin 10.4 (*)    HCT 32.3 (*)    All other components within normal limits  COMPREHENSIVE METABOLIC PANEL WITH GFR - Abnormal; Notable for the following components:   Glucose, Bld 129 (*)    Creatinine, Ser 1.43 (*)    Total Protein 5.9 (*)    Albumin 3.4 (*)    GFR, Estimated 41 (*)    All other components within normal limits  BRAIN NATRIURETIC PEPTIDE  TROPONIN I (HIGH SENSITIVITY)    EKG EKG Interpretation Date/Time:  Friday March 20 2024 19:33:16 EDT Ventricular Rate:  91 PR Interval:  145 QRS Duration:  95 QT Interval:  380 QTC Calculation: 468 R Axis:   56  Text Interpretation: Sinus rhythm Confirmed by Virgina Norfolk [phone]) on 03/20/2024 7:40:10 PM  Radiology US Venous Img Lower   Left (DVT Study) Result Date: 03/20/2024 CLINICAL DATA:  Pain and swelling EXAM: LEFT LOWER EXTREMITY VENOUS DOPPLER ULTRASOUND TECHNIQUE: Gray-scale sonography with compression, as well as color and duplex ultrasound, were performed to evaluate the deep venous system(s) from the level of the common femoral vein through the popliteal  and proximal calf veins. COMPARISON:  None Available. FINDINGS: VENOUS Normal compressibility of the common femoral, superficial femoral, and popliteal veins, as well as the visualized calf veins. Visualized portions of profunda femoral vein and great saphenous vein unremarkable. No filling defects to suggest DVT on grayscale or color Doppler imaging. Doppler waveforms show normal direction of venous flow, normal respiratory plasticity and response to augmentation. Limited views of the contralateral common femoral vein are unremarkable. OTHER There is a 4.4 x 2.9 x 2.2 cm cystic area in the left popliteal fossa which appears slightly lobulated with internal septation. Limitations: CT IMPRESSION: 1. No evidence for deep venous thrombosis in the left lower extremity. 2. Mildly complex left popliteal/Baker's cyst. Correlate clinically. Electronically Signed   By: Darliss Cheney M.D.   On: 03/20/2024 20:29   DG Chest Portable 1 View Result Date: 03/20/2024 CLINICAL DATA:  Swelling pain EXAM: PORTABLE CHEST 1 VIEW COMPARISON:  02/24/2024 FINDINGS: The heart size and mediastinal contours are within normal limits. Both lungs are clear. The visualized skeletal structures are unremarkable. Aortic atherosclerosis. Hardware in the cervicothoracic spine. Ovoid calcifications at the bilateral shoulders. IMPRESSION: No active disease. Electronically Signed   By: Jasmine Pang M.D.   On: 03/20/2024 20:05    Procedures Procedures    Medications Ordered in ED Medications - No data to display  ED Course/ Medical Decision Making/ A&P                                 Medical Decision  Making Amount and/or Complexity of Data Reviewed Labs: ordered. Radiology: ordered.   JANELIS STELZER is here with shortness of breath leg swelling.  History of bronchitis peripheral edema, hypertension anxiety depression.  Normal vitals.  No fever.  Pitting edema in both legs bilaterally left greater than the right.  Clear breath sounds however.  Overall we will evaluate for ACS CHF edema DVT check basic labs check renal function check liver function.  She was just recently admitted for overdose.  She is on 20 mg of Lasix daily.  She uses compression socks.  Likely just peripheral edema but will do full evaluation.  She has some asymmetry in her legs will rule out DVT in the left lower leg.  DVT study negative per radiology report but does have a popliteal Baker's cyst.  Suspect that is why maybe the left lower leg is a little bit more swollen than the right.  Chest x-ray with no evidence of pneumonia pneumothorax or volume overload.  BNP troponin unremarkable.  Creatinine 1.4.  Liver enzymes unremarkable.  No significant leukocytosis or anemia.  Ultimately we will have her increase her Lasix to 40 mg in the neck several days and have her follow-up with her primary care doctor.  Recommend continues to compression stockings will have her follow-up with orthopedics regarding Baker's cyst.  Discharged in good condition.  Understands return precautions.  This chart was dictated using voice recognition software.  Despite best efforts to proofread,  errors can occur which can change the documentation meaning.         Final Clinical Impression(s) / ED Diagnoses Final diagnoses:  Peripheral edema  Baker cyst, left    Rx / DC Orders ED Discharge Orders     None         Virgina Norfolk, DO 03/20/24 2038

## 2024-03-20 NOTE — Discharge Instructions (Signed)
 Increase your Lasix to 40 mg daily for the next 3 days.  Follow-up with orthopedics.  Follow-up with your primary care doctor.

## 2024-03-20 NOTE — ED Triage Notes (Signed)
 Pt states d/c from hospital on 3/25, started to have swelling to left side on 3/26 States pain to skin with swelling Was taken off of heart medications while at the hospital  Takes lasix daily   Was admitted for drug overdose per pt

## 2024-03-23 ENCOUNTER — Telehealth: Payer: Self-pay | Admitting: Cardiology

## 2024-03-23 NOTE — Telephone Encounter (Signed)
 She can restart Spironolactone and acebutolol and contact us if she has issues with BP or HR

## 2024-03-23 NOTE — Telephone Encounter (Signed)
  Per MyChart scheduling message:  I was released from [facility] on the 25th. During my stay, the physician took me off of spironolactone as well as acebutelol.)so?). My blood pressure runs low then high and my pulse rate is consistently in the high 90's to more often 110 while resting I am attaching a copy of his changed med list for your review. I am truly not feeling well.

## 2024-03-23 NOTE — Telephone Encounter (Signed)
 Patient reports she has not been feeling well since her spironolactone and acebutolol were discontinued at recent hospitalization on 03/10/24 (due to soft BP readings).  Patient reports having palpitations, HR ranging 90's-110 at rest. BP ranging 140-150 systolic over 90 diastolic. She reports feeling fatigued.  She reports drinking about 1 liter of water daily. Denies any CP. She went to ED on 4/4 for swelling in legs. ED provided increased Lasix to 40 mg x3 days. She is now back on Lasix 20 mg daily, edema improving. SOB only "slightly better".  Patient reports she does not feel her bladder is emptying completely after having catheters during recent hospitalization. She states she also has a cold.  Will forward to Dr. Jacinto Halim to review and advise.

## 2024-03-24 NOTE — Telephone Encounter (Signed)
 Left message to call office.  Patient also needs follow up with Dr Jacinto Halim scheduled.  Last seen 08/29/22

## 2024-03-31 NOTE — Telephone Encounter (Signed)
 Left message to call office

## 2024-04-17 ENCOUNTER — Ambulatory Visit (HOSPITAL_BASED_OUTPATIENT_CLINIC_OR_DEPARTMENT_OTHER): Admitting: Orthopaedic Surgery

## 2024-04-17 ENCOUNTER — Ambulatory Visit (HOSPITAL_BASED_OUTPATIENT_CLINIC_OR_DEPARTMENT_OTHER)

## 2024-04-17 DIAGNOSIS — M25511 Pain in right shoulder: Secondary | ICD-10-CM

## 2024-04-17 DIAGNOSIS — M25562 Pain in left knee: Secondary | ICD-10-CM

## 2024-04-17 DIAGNOSIS — G8929 Other chronic pain: Secondary | ICD-10-CM

## 2024-04-17 MED ORDER — TRIAMCINOLONE ACETONIDE 40 MG/ML IJ SUSP
80.0000 mg | INTRAMUSCULAR | Status: AC | PRN
  Administered 2024-04-17: 80 mg via INTRA_ARTICULAR

## 2024-04-17 MED ORDER — LIDOCAINE HCL 1 % IJ SOLN
4.0000 mL | INTRAMUSCULAR | Status: AC | PRN
  Administered 2024-04-17: 4 mL

## 2024-04-17 NOTE — Progress Notes (Signed)
 Chief Complaint: Right shoulder, left knee pain     History of Present Illness:    Sharon Huffman is a 63 y.o. female presents with right shoulder pain as well as left knee pain after a fall while she tripped getting into the tub.  Since this time she has had pain with overhead motion.  She did have swelling of the left leg.  She was sent for an ultrasound which revealed Baker's cyst    PMH/PSH/Family History/Social History/Meds/Allergies:    Past Medical History:  Diagnosis Date  . ADHD   . Anxiety   . Bronchitis, chronic (HCC)   . Cervical cancer (HCC)   . Depression   . Depression   . Hyperlipidemia   . Hypertension   . Insomnia disorder   . Lyme disease   . Migraine   . Ovarian cancer (HCC)   . Palpitations    Past Surgical History:  Procedure Laterality Date  . ABDOMINAL HYSTERECTOMY  1994  . ANTERIOR CERVICAL DECOMP/DISCECTOMY FUSION  2000  . AUGMENTATION MAMMAPLASTY Bilateral 1998  . BREAST ENHANCEMENT SURGERY Bilateral   . EYE SURGERY  2016   PVD   Social History   Socioeconomic History  . Marital status: Married    Spouse name: Not on file  . Number of children: 0  . Years of education: 49  . Highest education level: Not on file  Occupational History  . Occupation: Estate agent  Tobacco Use  . Smoking status: Former    Current packs/day: 0.00    Average packs/day: 0.5 packs/day for 42.9 years (21.5 ttl pk-yrs)    Types: Cigarettes    Start date: 12/18/1975    Quit date: 11/27/2018    Years since quitting: 5.3  . Smokeless tobacco: Never  Vaping Use  . Vaping status: Former  Substance and Sexual Activity  . Alcohol  use: Not Currently    Alcohol /week: 7.0 standard drinks of alcohol     Types: 7 Glasses of wine per week    Comment: Quit 2020.  Aaron Aas Drug use: No  . Sexual activity: Not on file    Comment: Married  Other Topics Concern  . Not on file  Social History Narrative   Lives at home w/ her husband and mother w/ dementia    Right-handed   Caffeine : 3 cups coffee per day   Social Drivers of Health   Financial Resource Strain: Not on file  Food Insecurity: No Food Insecurity (02/24/2024)   Hunger Vital Sign   . Worried About Programme researcher, broadcasting/film/video in the Last Year: Never true   . Ran Out of Food in the Last Year: Never true  Transportation Needs: No Transportation Needs (02/24/2024)   PRAPARE - Transportation   . Lack of Transportation (Medical): No   . Lack of Transportation (Non-Medical): No  Physical Activity: Not on file  Stress: Not on file  Social Connections: Moderately Isolated (02/24/2024)   Social Connection and Isolation Panel [NHANES]   . Frequency of Communication with Friends and Family: More than three times a week   . Frequency of Social Gatherings with Friends and Family: Never   . Attends Religious Services: Never   . Active Member of Clubs or Organizations: No   . Attends Banker Meetings: Never   . Marital Status: Married   Family History  Problem Relation Age of Onset  . Dementia Mother   . Hypertension Mother   . Hyperlipidemia Mother   . Heart attack  Father        79  . Heart disease Father    Allergies  Allergen Reactions  . Amlodipine  Swelling and Other (See Comments)    Leg edema and fluid retention  . Atorvastatin Other (See Comments)    Other reaction(s): back ache  . Bystolic  [Nebivolol  Hcl] Other (See Comments)    sweat and get clammy  . Carbamazepine  Other (See Comments)    rash  . Mirtazapine Other (See Comments)    Other reaction(s): edema, vivid nightmares, patient was unsure   . Penicillins Nausea And Vomiting  . Sulfacetamide Nausea Only   Current Outpatient Medications  Medication Sig Dispense Refill  . ALPRAZolam  (XANAX ) 1 MG tablet Take 0.5 tablets (0.5 mg total) by mouth 2 (two) times daily as needed for anxiety.    Aaron Aas amphetamine-dextroamphetamine (ADDERALL XR) 30 MG 24 hr capsule Take 30 mg by mouth at bedtime.    . aspirin  EC 81 MG  tablet Take 1 tablet (81 mg total) by mouth daily. Swallow whole. 30 tablet 0  . Biotin 10 MG CAPS Take by mouth daily.    . celecoxib (CELEBREX) 200 MG capsule Take 200 mg by mouth 2 (two) times daily as needed for mild pain (pain score 1-3) or moderate pain (pain score 4-6).    . escitalopram  (LEXAPRO ) 10 MG tablet Take 1 tablet (10 mg total) by mouth daily. 30 tablet 0  . famotidine  (PEPCID ) 20 MG tablet One after supper (Patient taking differently: Take 20 mg by mouth at bedtime.) 30 tablet 11  . folic acid (FOLVITE) 1 MG tablet Take 1 mg by mouth daily.    . furosemide  (LASIX ) 20 MG tablet Take 1 tablet (20 mg total) by mouth daily. 30 tablet 0  . gabapentin  (NEURONTIN ) 100 MG capsule Take 1 capsule (100 mg total) by mouth 2 (two) times daily. 60 capsule 0  . HYDROcodone-acetaminophen  (NORCO) 10-325 MG tablet Take 0.5 tablets by mouth 2 (two) times daily.    Aaron Aas ipratropium (ATROVENT) 0.06 % nasal spray Place 2 sprays into both nostrils as needed.    . levothyroxine  (SYNTHROID ) 50 MCG tablet Take 50 mcg by mouth daily before breakfast.    . midodrine  (PROAMATINE ) 5 MG tablet USE AS DIRECTED 270 tablet 1  . morphine  (MS CONTIN ) 15 MG 12 hr tablet Take 15 mg by mouth 2 (two) times daily.    . Multiple Vitamin (MULTIVITAMIN WITH MINERALS) TABS tablet Take 1 tablet by mouth daily. 30 tablet 0  . OLANZapine  (ZYPREXA ) 2.5 MG tablet Take 1 tablet (2.5 mg total) by mouth at bedtime. 30 tablet 0  . pantoprazole  (PROTONIX ) 40 MG tablet TAKE 1 TABLET (40 MG TOTAL) BY MOUTH DAILY. TAKE 30-60 MIN BEFORE FIRST MEAL OF THE DAY 30 tablet 0  . rosuvastatin  (CRESTOR ) 10 MG tablet Take 10 mg by mouth daily.    . thiamine  (VITAMIN B-1) 100 MG tablet Take 1 tablet (100 mg total) by mouth daily. 30 tablet 0   No current facility-administered medications for this visit.   No results found.  Review of Systems:   A ROS was performed including pertinent positives and negatives as documented in the HPI.  Physical  Exam :   Constitutional: NAD and appears stated age Neurological: Alert and oriented Psych: Appropriate affect and cooperative There were no vitals taken for this visit.   Comprehensive Musculoskeletal Exam:    Tenderness about the lateral joint line with crepitus.  Range of motion is -3 to 230 degrees.  Negative Lachman Right shoulder with pain with overhead forward elevation to 140 degrees.  Pain with passive external rotation    Imaging:   Xray (3 views left knee): Advanced lateral tibiofemoral osteoarthritis    I personally reviewed and interpreted the radiographs.   Assessment and Plan:   63 y.o. female with left knee osteoarthritis for which I recommended ultrasound-guided injection of her left knee.  She is also have evidence of a right frozen shoulder following a fall onto the side.  Given this we plan to proceed with a right ultrasound-guided injection of her shoulder as well  -Right shoulder, left knee ultrasound-guided injections provided after verbal consent obtained    Procedure Note  Patient: KEVIANA BALSAM             Date of Birth: 02/07/1961           MRN: [id number]             Visit Date: 04/17/2024  Procedures: Visit Diagnoses:  1. Chronic pain of left knee     Large Joint Inj: L knee on 04/17/2024 3:21 PM Indications: pain Details: 22 G 1.5 in needle, ultrasound-guided anterior approach  Arthrogram: No  Medications: 4 mL lidocaine 1 %; 80 mg triamcinolone  acetonide 40 MG/ML Outcome: tolerated well, no immediate complications Procedure, treatment alternatives, risks and benefits explained, specific risks discussed. Consent was given by the patient. Immediately prior to procedure a time out was called to verify the correct patient, procedure, equipment, support staff and site/side marked as required. Patient was prepped and draped in the usual sterile fashion.    Large Joint Inj: R glenohumeral on 04/17/2024 3:22 PM Indications: pain Details: 22 G 1.5  in needle, ultrasound-guided anterior approach  Arthrogram: No  Medications: 4 mL lidocaine 1 %; 80 mg triamcinolone  acetonide 40 MG/ML Outcome: tolerated well, no immediate complications Procedure, treatment alternatives, risks and benefits explained, specific risks discussed. Consent was given by the patient. Immediately prior to procedure a time out was called to verify the correct patient, procedure, equipment, support staff and site/side marked as required. Patient was prepped and draped in the usual sterile fashion.        I personally saw and evaluated the patient, and participated in the management and treatment plan.  Wilhelmenia Harada, MD Attending Physician, Orthopedic Surgery  This document was dictated using Dragon voice recognition software. A reasonable attempt at proof reading has been made to minimize errors.

## 2024-04-17 NOTE — Telephone Encounter (Signed)
 Left message to call office

## 2024-06-05 ENCOUNTER — Encounter: Payer: Self-pay | Admitting: Cardiology

## 2024-06-05 ENCOUNTER — Ambulatory Visit: Attending: Cardiology | Admitting: Cardiology

## 2024-06-05 VITALS — BP 143/88 | HR 77 | Resp 16 | Ht 65.0 in | Wt 148.2 lb

## 2024-06-05 DIAGNOSIS — I10 Essential (primary) hypertension: Secondary | ICD-10-CM | POA: Diagnosis not present

## 2024-06-05 DIAGNOSIS — E7800 Pure hypercholesterolemia, unspecified: Secondary | ICD-10-CM | POA: Diagnosis not present

## 2024-06-05 DIAGNOSIS — I951 Orthostatic hypotension: Secondary | ICD-10-CM

## 2024-06-05 DIAGNOSIS — G901 Familial dysautonomia [Riley-Day]: Secondary | ICD-10-CM

## 2024-06-05 DIAGNOSIS — M3313 Other dermatomyositis without myopathy: Secondary | ICD-10-CM

## 2024-06-05 NOTE — Patient Instructions (Signed)
 Medication Instructions:  Hold Rosuvastatin  for one month.  Start back at half dose if aching better.   If aches return please call the office.  If there is no change in aching resume Rosuvastatin  at 5 mg daily dose Change times of midodrine  to morning and late afternoon *If you need a refill on your cardiac medications before your next appointment, please call your pharmacy*  Lab Work:  none If you have labs (blood work) drawn today and your tests are completely normal, you will receive your results only by: MyChart Message (if you have MyChart) OR A paper copy in the mail If you have any lab test that is abnormal or we need to change your treatment, we will call you to review the results.  Testing/Procedures: none  Follow-Up: At [facility], you and your health needs are our priority.  As part of our continuing mission to provide you with exceptional heart care, our providers are all part of one team.  This team includes your primary Cardiologist (physician) and Advanced Practice Providers or APPs (Physician Assistants and Nurse Practitioners) who all work together to provide you with the care you need, when you need it.  Your next appointment:   As needed  Provider:   Knox Perl, MD    We recommend signing up for the patient portal called MyChart.  Sign up information is provided on this After Visit Summary.  MyChart is used to connect with patients for Virtual Visits (Telemedicine).  Patients are able to view lab/test results, encounter notes, upcoming appointments, etc.  Non-urgent messages can be sent to your provider as well.   To learn more about what you can do with MyChart, go to [email or web address].   Other Instructions

## 2024-06-05 NOTE — Progress Notes (Signed)
 Cardiology Office Note:  .   Date:  06/06/2024  ID:  Sharon Huffman, DOB May 20, 1961, MRN [id number] PCP: Clarice Nottingham, MD  [facility] Providers Cardiologist:  Gordy Bergamo, MD   History of Present Illness: .   Sharon Huffman is a 63 y.o.  female with  family history of premature coronary artery disease in her father had CAD at 70 Y and died at 93 Years of age, tobacco use quit in Dec 2019, 15-20 pack year history, hypertension and hyperlipidemia, mild coronary artery disease by coronary CTA on 06/08/2019 with mid LAD 50% stenosis CT FFR negative for significant stenosis, dermatomyositis and is being followed by rheumatology, Dr. Cindy Setter.  She also has dysautonomia with episodic of dysautonomia, hypothermia, hyperthermia, excessive sweating that unknown times, weight gain and weight loss, orthostatic hypotension, palpitations, fatigue and arthritis.    She has had a normal echocardiogram with normal LVEF on 06/01/2019 and it coronary CTA on 06/08/2019 revealed 25-49% stenosis in proximal LAD and coronary calcium  score of 291 at 96 percentile.  She has also had a negative nuclear stress test on 06/15/2020.  Discussed the use of AI scribe software for clinical note transcription with the patient, who gave verbal consent to proceed.  History of Present Illness Sharon Huffman is a 63 year old female with dysautonomia and dermatomyositis who presents with worsening symptoms of dysautonomia. She experiences increased frequency of dizziness and sweating episodes. She has episodes of hypothermia alternating with periods of feeling hot, with both sweating and non-sweating phases. Her extremities are either freezing or sweating, significantly impacting her sleep, limiting her to about an hour to an hour and a half of sleep per night. She often wakes up with a high temperature of 102 degrees.  She has lost 16 pounds in three weeks despite not trying to lose weight. Eating triggers hypothermia, as she feels  cold when blood rushes to her stomach.  Her medication regimen includes midodrine , taken twice daily, and Motrin, also taken twice daily. She rarely uses Lasix  due to lack of puffiness. She is on Crestor  (rosuvastatin ) 10 mg for cholesterol management.  Labs   Lab Results  Component Value Date   NA 140 03/20/2024   K 3.5 03/20/2024   CO2 30 03/20/2024   GLUCOSE 129 (H) 03/20/2024   BUN 18 03/20/2024   CREATININE 1.43 (H) 03/20/2024   CALCIUM  9.3 03/20/2024   EGFR 61.0 10/16/2023   GFRNONAA 41 (L) 03/20/2024      Latest Ref Rng & Units 03/20/2024    7:39 PM 03/07/2024    3:51 AM 03/06/2024    3:25 AM  BMP  Glucose 70 - 99 mg/dL 870  891  890   BUN 8 - 23 mg/dL 18  17  20    Creatinine 0.44 - 1.00 mg/dL 8.56  8.91  8.71   Sodium 135 - 145 mmol/L 140  136  139   Potassium 3.5 - 5.1 mmol/L 3.5  3.6  4.1   Chloride 98 - 111 mmol/L 99  107  105   CO2 22 - 32 mmol/L 30  25  23    Calcium  8.9 - 10.3 mg/dL 9.3  8.2  8.9       Latest Ref Rng & Units 03/20/2024    7:39 PM 03/03/2024    8:34 AM 03/01/2024    1:34 AM  CBC  WBC 4.0 - 10.5 K/uL 5.5  7.6  7.6   Hemoglobin 12.0 - 15.0 g/dL 10.4  10.9  10.9   Hematocrit 36.0 - 46.0 % 32.3  33.7  32.5   Platelets 150 - 400 K/uL 247  321  279    Lab Results  Component Value Date   HGBA1C 5.7 (H) 02/24/2024    Lab Results  Component Value Date   TSH 1.088 02/24/2024    External Labs:  [facility] labs 10/15/2023:  Total cholesterol 161, triglycerides 12/25/2021, HDL 67, LDL 73.  ROS  Review of Systems  Constitutional: Positive for malaise/fatigue.  Cardiovascular:  Negative for chest pain, dyspnea on exertion and leg swelling.  Skin:  Positive for rash.  Musculoskeletal:  Positive for joint pain and muscle weakness.   Physical Exam:   VS:  BP (!) 143/88 (BP Location: Left Arm, Patient Position: Sitting, Cuff Size: Normal)   Pulse 77   Resp 16   Ht 5' 5 (1.651 m)   Wt 148 lb 3.2 oz (67.2 kg)   SpO2 95%   BMI 24.66 kg/m    Wt Readings  from Last 3 Encounters:  06/05/24 148 lb 3.2 oz (67.2 kg)  03/20/24 143 lb 15.4 oz (65.3 kg)  02/23/24 143 lb 15.4 oz (65.3 kg)    Physical Exam Neck:     Vascular: No carotid bruit or JVD.   Cardiovascular:     Rate and Rhythm: Normal rate and regular rhythm.     Pulses: Intact distal pulses.     Heart sounds: Normal heart sounds. No murmur heard.    No gallop.  Pulmonary:     Effort: Pulmonary effort is normal.     Breath sounds: Normal breath sounds.  Abdominal:     General: Bowel sounds are normal.     Palpations: Abdomen is soft.   Musculoskeletal:     Right lower leg: No edema.     Left lower leg: No edema.    Studies Reviewed: SABRA     EKG:        Medications ordered    No orders of the defined types were placed in this encounter.    ASSESSMENT AND PLAN: .      ICD-10-CM   1. Dysautonomia (HCC)  G90.1     2. Orthostatic hypotension  I95.1     3. Supine hypertension  I10     4. Hypercholesteremia  E78.00     5. Dermatomyositis (HCC)  M33.13      Assessment & Plan Dysautonomia Chronic dysautonomia with severe and progressive symptoms, including orthostatic hypotension, supine hypertension, excessive sweating, heat and cold intolerance, and dysphagia. Management is symptomatic as there is no cure or effective treatment. Midodrine  is used for blood pressure management, but dosing requires adjustment to prevent nighttime hypertension. - Adjust midodrine  dosing to twice daily, in the morning and around 4 PM, avoiding nighttime dosing to prevent supine hypertension. - Encourage contacting the [facility] for potential involvement in clinical trials or further evaluation.  Dermatomyositis Chronic dermatomyositis contributing to systemic symptoms and possibly exacerbating dysautonomia. Management is complicated by multiple medication allergies and intolerances. Consideration of statin-induced myopathy as a potential contributor to symptoms. Recent coronary CT angiogram  showed 25-40% blockage, not significant. Decision to adjust rosuvastatin  due to potential myopathy and systemic disease. - Hold rosuvastatin  for one month to assess for improvement in myalgia. - If symptoms improve, resume rosuvastatin  at a reduced dose of 5 mg daily. - If no change in symptoms, resume rosuvastatin  at 5 mg daily for cardiovascular protection.  Weight loss Significant unintentional weight loss of 16  pounds over three weeks, associated with systemic disease and dysautonomia symptoms, including postprandial hypothermia.  There has been complete resolution of leg edema as well.  I will see her back on a as needed basis as she is stable from cardiac standpoint.   Signed,  Gordy Bergamo, MD, [facility] 06/06/2024, 6:33 PM [facility] [address] [place], [place] [address] Phone: [phone]. Fax:  [phone]

## 2024-06-11 ENCOUNTER — Ambulatory Visit: Admitting: Cardiology

## 2024-07-01 ENCOUNTER — Ambulatory Visit (HOSPITAL_BASED_OUTPATIENT_CLINIC_OR_DEPARTMENT_OTHER): Admitting: Orthopaedic Surgery

## 2024-07-01 DIAGNOSIS — G8929 Other chronic pain: Secondary | ICD-10-CM | POA: Diagnosis not present

## 2024-07-01 DIAGNOSIS — M25562 Pain in left knee: Secondary | ICD-10-CM

## 2024-07-01 DIAGNOSIS — M25511 Pain in right shoulder: Secondary | ICD-10-CM | POA: Diagnosis not present

## 2024-07-01 MED ORDER — TRIAMCINOLONE ACETONIDE 40 MG/ML IJ SUSP
80.0000 mg | INTRAMUSCULAR | Status: AC | PRN
Start: 2024-07-01 — End: 2024-07-01
  Administered 2024-07-01: 80 mg via INTRA_ARTICULAR

## 2024-07-01 MED ORDER — TRIAMCINOLONE ACETONIDE 40 MG/ML IJ SUSP
80.0000 mg | INTRAMUSCULAR | Status: AC | PRN
  Administered 2024-07-01: 80 mg via INTRA_ARTICULAR

## 2024-07-01 MED ORDER — LIDOCAINE HCL 1 % IJ SOLN
4.0000 mL | INTRAMUSCULAR | Status: AC | PRN
  Administered 2024-07-01: 4 mL

## 2024-07-01 NOTE — Progress Notes (Signed)
 Chief Complaint: Right shoulder, left knee pain     History of Present Illness:   07/01/2024: Sharon Huffman today seeking additional right shoulder as well as left knee injection.  Sharon Huffman is a 63 y.o. female presents with right shoulder pain as well as left knee pain after a fall while she tripped getting into the tub.  Since this time she has had pain with overhead motion.  She did have swelling of the left leg.  She was sent for an ultrasound which revealed Baker's cyst    PMH/PSH/Family History/Social History/Meds/Allergies:    Past Medical History:  Diagnosis Date  . ADHD   . Anxiety   . Bronchitis, chronic (HCC)   . Cervical cancer (HCC)   . Depression   . Depression   . Hyperlipidemia   . Hypertension   . Insomnia disorder   . Lyme disease   . Migraine   . Ovarian cancer (HCC)   . Palpitations    Past Surgical History:  Procedure Laterality Date  . ABDOMINAL HYSTERECTOMY  1994  . ANTERIOR CERVICAL DECOMP/DISCECTOMY FUSION  2000  . AUGMENTATION MAMMAPLASTY Bilateral 1998  . BREAST ENHANCEMENT SURGERY Bilateral   . EYE SURGERY  2016   PVD   Social History   Socioeconomic History  . Marital status: Married    Spouse name: Not on file  . Number of children: 0  . Years of education: 72  . Highest education level: Not on file  Occupational History  . Occupation: Estate agent  Tobacco Use  . Smoking status: Former    Current packs/day: 0.00    Average packs/day: 0.5 packs/day for 42.9 years (21.5 ttl pk-yrs)    Types: Cigarettes    Start date: 12/18/1975    Quit date: 11/27/2018    Years since quitting: 5.5  . Smokeless tobacco: Never  Vaping Use  . Vaping status: Former  Substance and Sexual Activity  . Alcohol  use: Not Currently    Alcohol /week: 7.0 standard drinks of alcohol     Types: 7 Glasses of wine per week    Comment: Quit 2020.  SABRA Drug use: No  . Sexual activity: Not on file    Comment: Married  Other Topics Concern  . Not on file   Social History Narrative   Lives at home w/ her husband and mother w/ dementia   Right-handed   Caffeine : 3 cups coffee per day   Social Drivers of Health   Financial Resource Strain: Not on file  Food Insecurity: No Food Insecurity (02/24/2024)   Hunger Vital Sign   . Worried About Programme researcher, broadcasting/film/video in the Last Year: Never true   . Ran Out of Food in the Last Year: Never true  Transportation Needs: No Transportation Needs (02/24/2024)   PRAPARE - Transportation   . Lack of Transportation (Medical): No   . Lack of Transportation (Non-Medical): No  Physical Activity: Not on file  Stress: Not on file  Social Connections: Moderately Isolated (02/24/2024)   Social Connection and Isolation Panel   . Frequency of Communication with Friends and Family: More than three times a week   . Frequency of Social Gatherings with Friends and Family: Never   . Attends Religious Services: Never   . Active Member of Clubs or Organizations: No   . Attends Banker Meetings: Never   . Marital Status: Married   Family History  Problem Relation Age of Onset  . Dementia Mother   .  Hypertension Mother   . Hyperlipidemia Mother   . Heart attack Father        20  . Heart disease Father    Allergies  Allergen Reactions  . Amlodipine  Swelling and Other (See Comments)    Leg edema and fluid retention  . Atorvastatin Other (See Comments)    Other reaction(s): back ache  . Bystolic  [Nebivolol  Hcl] Other (See Comments)    sweat and get clammy  . Carbamazepine  Other (See Comments)    rash  . Mirtazapine Other (See Comments)    Other reaction(s): edema, vivid nightmares, patient was unsure   . Penicillins Nausea And Vomiting  . Sulfacetamide Nausea Only   Current Outpatient Medications  Medication Sig Dispense Refill  . ADDERALL XR 10 MG 24 hr capsule Take 10 mg by mouth daily.    . aspirin  EC 81 MG tablet Take 1 tablet (81 mg total) by mouth daily. Swallow whole. 30 tablet 0  .  Biotin 10 MG CAPS Take by mouth daily.    . celecoxib (CELEBREX) 200 MG capsule Take 200 mg by mouth 2 (two) times daily as needed for mild pain (pain score 1-3) or moderate pain (pain score 4-6).    . famotidine  (PEPCID ) 20 MG tablet One after supper 30 tablet 11  . folic acid (FOLVITE) 1 MG tablet Take 1 mg by mouth daily.    . furosemide  (LASIX ) 20 MG tablet Take 1 tablet (20 mg total) by mouth daily. 30 tablet 0  . HYDROcodone-acetaminophen  (NORCO) 10-325 MG tablet Take 0.5 tablets by mouth 2 (two) times daily.    SABRA ipratropium (ATROVENT) 0.06 % nasal spray Place 2 sprays into both nostrils as needed.    . levothyroxine  (SYNTHROID ) 50 MCG tablet Take 50 mcg by mouth daily before breakfast.    . midodrine  (PROAMATINE ) 5 MG tablet USE AS DIRECTED 270 tablet 1  . morphine  (MS CONTIN ) 15 MG 12 hr tablet Take 15 mg by mouth 2 (two) times daily.    . Multiple Vitamin (MULTIVITAMIN WITH MINERALS) TABS tablet Take 1 tablet by mouth daily. 30 tablet 0  . pantoprazole  (PROTONIX ) 40 MG tablet TAKE 1 TABLET (40 MG TOTAL) BY MOUTH DAILY. TAKE 30-60 MIN BEFORE FIRST MEAL OF THE DAY 30 tablet 0  . rosuvastatin  (CRESTOR ) 10 MG tablet Take 10 mg by mouth daily.    . thiamine  (VITAMIN B-1) 100 MG tablet Take 1 tablet (100 mg total) by mouth daily. 30 tablet 0   No current facility-administered medications for this visit.   No results found.  Review of Systems:   A ROS was performed including pertinent positives and negatives as documented in the HPI.  Physical Exam :   Constitutional: NAD and appears stated age Neurological: Alert and oriented Psych: Appropriate affect and cooperative There were no vitals taken for this visit.   Comprehensive Musculoskeletal Exam:    Tenderness about the lateral joint line with crepitus.  Range of motion is -3 to 230 degrees.  Negative Lachman Right shoulder with pain with overhead forward elevation to 140 degrees.  Pain with passive external  rotation    Imaging:   Xray (3 views left knee): Advanced lateral tibiofemoral osteoarthritis    I personally reviewed and interpreted the radiographs.   Assessment and Plan:   63 y.o. female with left knee osteoarthritis for which I recommended ultrasound-guided injection of her left knee.  She is also have evidence of a right frozen shoulder following a fall onto the side.  Given this we plan to proceed with a right ultrasound-guided injection of her shoulder as well  -Right shoulder, left knee ultrasound-guided injections provided after verbal consent obtained    Procedure Note  Patient: Sharon Huffman             Date of Birth: 09-22-1961           MRN: [id number]             Visit Date: 07/01/2024  Procedures: Visit Diagnoses:  No diagnosis found.   Large Joint Inj: R glenohumeral on 07/01/2024 4:32 PM Indications: pain Details: 22 G 1.5 in needle, ultrasound-guided anterior approach  Arthrogram: No  Medications: 4 mL lidocaine  1 %; 80 mg triamcinolone  acetonide 40 MG/ML Outcome: tolerated well, no immediate complications Procedure, treatment alternatives, risks and benefits explained, specific risks discussed. Consent was given by the patient. Immediately prior to procedure a time out was called to verify the correct patient, procedure, equipment, support staff and site/side marked as required. Patient was prepped and draped in the usual sterile fashion.    Large Joint Inj: L knee on 07/01/2024 4:32 PM Indications: pain Details: 22 G 1.5 in needle, ultrasound-guided anterior approach  Arthrogram: No  Medications: 4 mL lidocaine  1 %; 80 mg triamcinolone  acetonide 40 MG/ML Outcome: tolerated well, no immediate complications Procedure, treatment alternatives, risks and benefits explained, specific risks discussed. Consent was given by the patient. Immediately prior to procedure a time out was called to verify the correct patient, procedure, equipment, support staff and  site/side marked as required. Patient was prepped and draped in the usual sterile fashion.        I personally saw and evaluated the patient, and participated in the management and treatment plan.  Elspeth Parker, MD Attending Physician, Orthopedic Surgery  This document was dictated using Dragon voice recognition software. A reasonable attempt at proof reading has been made to minimize errors.

## 2024-07-02 ENCOUNTER — Encounter (HOSPITAL_BASED_OUTPATIENT_CLINIC_OR_DEPARTMENT_OTHER): Payer: Self-pay | Admitting: Orthopaedic Surgery

## 2024-07-09 ENCOUNTER — Encounter (HOSPITAL_BASED_OUTPATIENT_CLINIC_OR_DEPARTMENT_OTHER): Payer: Self-pay | Admitting: Orthopaedic Surgery

## 2024-07-10 NOTE — Telephone Encounter (Signed)
 Called patient. No answer. Left voicemail. Advised her to give us  a call back so we can get her scheduled to come back in for reevaluation.

## 2024-07-15 ENCOUNTER — Ambulatory Visit (HOSPITAL_BASED_OUTPATIENT_CLINIC_OR_DEPARTMENT_OTHER): Admitting: Orthopaedic Surgery

## 2024-07-15 DIAGNOSIS — M25511 Pain in right shoulder: Secondary | ICD-10-CM

## 2024-07-15 NOTE — Progress Notes (Signed)
 Chief Complaint: Right shoulder, left knee pain     History of Present Illness:   07/15/2024: Presents today for follow-up of the right shoulder.  Unfortunately she did not get any relief from this.  Sharon Huffman is a 63 y.o. female presents with right shoulder pain as well as left knee pain after a fall while she tripped getting into the tub.  Since this time she has had pain with overhead motion.  She did have swelling of the left leg.  She was sent for an ultrasound which revealed Baker's cyst    PMH/PSH/Family History/Social History/Meds/Allergies:    Past Medical History:  Diagnosis Date   ADHD    Anxiety    Bronchitis, chronic (HCC)    Cervical cancer (HCC)    Depression    Depression    Hyperlipidemia    Hypertension    Insomnia disorder    Lyme disease    Migraine    Ovarian cancer (HCC)    Palpitations    Past Surgical History:  Procedure Laterality Date   ABDOMINAL HYSTERECTOMY  1994   ANTERIOR CERVICAL DECOMP/DISCECTOMY FUSION  2000   AUGMENTATION MAMMAPLASTY Bilateral 1998   BREAST ENHANCEMENT SURGERY Bilateral    EYE SURGERY  2016   PVD   Social History   Socioeconomic History   Marital status: Married    Spouse name: Not on file   Number of children: 0   Years of education: 17   Highest education level: Not on file  Occupational History   Occupation: Engineer, [employer]  Tobacco Use   Smoking status: Former    Current packs/day: 0.00    Average packs/day: 0.5 packs/day for 42.9 years (21.5 ttl pk-yrs)    Types: Cigarettes    Start date: 12/18/1975    Quit date: 11/27/2018    Years since quitting: 5.6   Smokeless tobacco: Never  Vaping Use   Vaping status: Former  Substance and Sexual Activity   Alcohol  use: Not Currently    Alcohol /week: 7.0 standard drinks of alcohol     Types: 7 Glasses of wine per week    Comment: Quit 2020.   Drug use: No   Sexual activity: Not on file    Comment: Married  Other Topics Concern   Not on file   Social History Narrative   Lives at home w/ her husband and mother w/ dementia   Right-handed   Caffeine : 3 cups coffee per day   Social Drivers of Health   Financial Resource Strain: Not on file  Food Insecurity: No Food Insecurity (02/24/2024)   Hunger Vital Sign    Worried About Running Out of Food in the Last Year: Never true    Ran Out of Food in the Last Year: Never true  Transportation Needs: No Transportation Needs (02/24/2024)   PRAPARE - Administrator, Civil Service (Medical): No    Lack of Transportation (Non-Medical): No  Physical Activity: Not on file  Stress: Not on file  Social Connections: Moderately Isolated (02/24/2024)   Social Connection and Isolation Panel    Frequency of Communication with Friends and Family: More than three times a week    Frequency of Social Gatherings with Friends and Family: Never    Attends Religious Services: Never    Database administrator or Organizations: No    Attends Banker Meetings: Never    Marital Status: Married   Family History  Problem Relation Age of Onset  Dementia Mother    Hypertension Mother    Hyperlipidemia Mother    Heart attack Father        51   Heart disease Father    Allergies  Allergen Reactions   Amlodipine  Swelling and Other (See Comments)    Leg edema and fluid retention   Atorvastatin Other (See Comments)    Other reaction(s): back ache   Bystolic  [Nebivolol  Hcl] Other (See Comments)    sweat and get clammy   Carbamazepine  Other (See Comments)    rash   Mirtazapine Other (See Comments)    Other reaction(s): edema, vivid nightmares, patient was unsure    Penicillins Nausea And Vomiting   Sulfacetamide Nausea Only   Current Outpatient Medications  Medication Sig Dispense Refill   ADDERALL XR 10 MG 24 hr capsule Take 10 mg by mouth daily.     aspirin  EC 81 MG tablet Take 1 tablet (81 mg total) by mouth daily. Swallow whole. 30 tablet 0   Biotin 10 MG CAPS Take by  mouth daily.     celecoxib (CELEBREX) 200 MG capsule Take 200 mg by mouth 2 (two) times daily as needed for mild pain (pain score 1-3) or moderate pain (pain score 4-6).     famotidine  (PEPCID ) 20 MG tablet One after supper 30 tablet 11   folic acid (FOLVITE) 1 MG tablet Take 1 mg by mouth daily.     furosemide  (LASIX ) 20 MG tablet Take 1 tablet (20 mg total) by mouth daily. 30 tablet 0   HYDROcodone-acetaminophen  (NORCO) 10-325 MG tablet Take 0.5 tablets by mouth 2 (two) times daily.     ipratropium (ATROVENT) 0.06 % nasal spray Place 2 sprays into both nostrils as needed.     levothyroxine  (SYNTHROID ) 50 MCG tablet Take 50 mcg by mouth daily before breakfast.     midodrine  (PROAMATINE ) 5 MG tablet USE AS DIRECTED 270 tablet 1   morphine  (MS CONTIN ) 15 MG 12 hr tablet Take 15 mg by mouth 2 (two) times daily.     Multiple Vitamin (MULTIVITAMIN WITH MINERALS) TABS tablet Take 1 tablet by mouth daily. 30 tablet 0   pantoprazole  (PROTONIX ) 40 MG tablet TAKE 1 TABLET (40 MG TOTAL) BY MOUTH DAILY. TAKE 30-60 MIN BEFORE FIRST MEAL OF THE DAY 30 tablet 0   rosuvastatin  (CRESTOR ) 10 MG tablet Take 10 mg by mouth daily.     thiamine  (VITAMIN B-1) 100 MG tablet Take 1 tablet (100 mg total) by mouth daily. 30 tablet 0   No current facility-administered medications for this visit.   No results found.  Review of Systems:   A ROS was performed including pertinent positives and negatives as documented in the HPI.  Physical Exam :   Constitutional: NAD and appears stated age Neurological: Alert and oriented Psych: Appropriate affect and cooperative There were no vitals taken for this visit.   Comprehensive Musculoskeletal Exam:    Tenderness about the lateral joint line with crepitus.  Range of motion is -3 to 230 degrees.  Negative Lachman Right shoulder with pain with overhead forward elevation to 140 degrees.  Pain with passive external rotation    Imaging:   Xray (3 views left  knee): Advanced lateral tibiofemoral osteoarthritis    I personally reviewed and interpreted the radiographs.   Assessment and Plan:   63 y.o. female with right shoulder pain consistent with adhesive capsulitis.  She did not have any relief from her injection and to this effect I do not believe  that this had optimal placement.  Will plan for an additional injection today  -Right shoulder ultrasound-guided injection provided after verbal consent obtained       I personally saw and evaluated the patient, and participated in the management and treatment plan.  Elspeth Parker, MD Attending Physician, Orthopedic Surgery  This document was dictated using Dragon voice recognition software. A reasonable attempt at proof reading has been made to minimize errors.

## 2024-07-20 ENCOUNTER — Other Ambulatory Visit: Payer: Self-pay | Admitting: Cardiology

## 2024-07-20 DIAGNOSIS — I10 Essential (primary) hypertension: Secondary | ICD-10-CM

## 2024-07-20 DIAGNOSIS — R600 Localized edema: Secondary | ICD-10-CM

## 2024-07-20 DIAGNOSIS — R002 Palpitations: Secondary | ICD-10-CM

## 2024-07-22 ENCOUNTER — Encounter: Payer: Self-pay | Admitting: Cardiology

## 2024-07-23 ENCOUNTER — Other Ambulatory Visit: Payer: Self-pay | Admitting: *Deleted

## 2024-07-23 MED ORDER — SPIRONOLACTONE 50 MG PO TABS: 50.0000 mg | ORAL_TABLET | Freq: Every day | ORAL | 0 refills | Status: DC

## 2024-07-23 MED ORDER — ACEBUTOLOL HCL 200 MG PO CAPS: 200.0000 mg | ORAL_CAPSULE | Freq: Every day | ORAL | 0 refills | Status: AC

## 2024-07-23 NOTE — Progress Notes (Signed)
 Medications sent to Patient pharmacy as requested per Dr, Ladona. Made patient per Dr. Ladona future refills will need to be filled by her PCP. Patient verbalized an understanding.

## 2024-07-23 NOTE — Telephone Encounter (Signed)
 Can you please refill for 90 days with 0 refill and inform pharmacy to send refill request to PCP

## 2024-07-30 ENCOUNTER — Encounter (HOSPITAL_BASED_OUTPATIENT_CLINIC_OR_DEPARTMENT_OTHER): Payer: Self-pay | Admitting: Orthopaedic Surgery

## 2024-07-31 ENCOUNTER — Encounter (HOSPITAL_BASED_OUTPATIENT_CLINIC_OR_DEPARTMENT_OTHER): Payer: Self-pay | Admitting: Orthopaedic Surgery

## 2024-08-05 ENCOUNTER — Ambulatory Visit (HOSPITAL_BASED_OUTPATIENT_CLINIC_OR_DEPARTMENT_OTHER): Admitting: Orthopaedic Surgery

## 2024-10-19 ENCOUNTER — Encounter: Payer: Self-pay | Admitting: Radiology

## 2024-10-20 ENCOUNTER — Other Ambulatory Visit: Payer: Self-pay | Admitting: Cardiology

## 2024-10-20 DIAGNOSIS — R55 Syncope and collapse: Secondary | ICD-10-CM

## 2024-10-20 NOTE — Telephone Encounter (Signed)
 Please clarify how pt is supposed to take medication midodrine . thanks

## 2024-10-21 ENCOUNTER — Other Ambulatory Visit: Payer: Self-pay | Admitting: Cardiology

## 2024-10-21 DIAGNOSIS — R55 Syncope and collapse: Secondary | ICD-10-CM

## 2024-10-22 NOTE — Telephone Encounter (Signed)
 Please clarify how pt is supposed to take medication. There is still no directions. Thanks

## 2024-11-07 ENCOUNTER — Other Ambulatory Visit: Payer: Self-pay | Admitting: Cardiology

## 2024-12-03 ENCOUNTER — Other Ambulatory Visit: Payer: Self-pay | Admitting: Cardiology
